# Patient Record
Sex: Female | Born: 1935 | Race: White | Hispanic: No | Marital: Married | Smoking: Never smoker
Health system: Southern US, Community
[De-identification: ages and names within clinical notes are randomized; demographics above are authoritative.]

## PROBLEM LIST (undated history)

## (undated) DIAGNOSIS — M810 Age-related osteoporosis without current pathological fracture: Secondary | ICD-10-CM

## (undated) DIAGNOSIS — J189 Pneumonia, unspecified organism: Secondary | ICD-10-CM

## (undated) DIAGNOSIS — E785 Hyperlipidemia, unspecified: Secondary | ICD-10-CM

## (undated) DIAGNOSIS — K5730 Diverticulosis of large intestine without perforation or abscess without bleeding: Secondary | ICD-10-CM

## (undated) DIAGNOSIS — Z85038 Personal history of other malignant neoplasm of large intestine: Principal | ICD-10-CM

## (undated) DIAGNOSIS — C801 Malignant (primary) neoplasm, unspecified: Secondary | ICD-10-CM

## (undated) DIAGNOSIS — K449 Diaphragmatic hernia without obstruction or gangrene: Secondary | ICD-10-CM

## (undated) DIAGNOSIS — E039 Hypothyroidism, unspecified: Secondary | ICD-10-CM

## (undated) DIAGNOSIS — K219 Gastro-esophageal reflux disease without esophagitis: Secondary | ICD-10-CM

## (undated) DIAGNOSIS — H9190 Unspecified hearing loss, unspecified ear: Secondary | ICD-10-CM

## (undated) DIAGNOSIS — Z8719 Personal history of other diseases of the digestive system: Secondary | ICD-10-CM

## (undated) HISTORY — PX: CHOLECYSTECTOMY: SHX55

## (undated) HISTORY — DX: Malignant (primary) neoplasm, unspecified: C80.1

## (undated) HISTORY — PX: ABDOMINAL HYSTERECTOMY: SHX81

## (undated) HISTORY — PX: ELBOW SURGERY: SHX618

## (undated) HISTORY — DX: Hyperlipidemia, unspecified: E78.5

## (undated) HISTORY — DX: Age-related osteoporosis without current pathological fracture: M81.0

## (undated) HISTORY — PX: APPENDECTOMY: SHX54

## (undated) HISTORY — DX: Hypothyroidism, unspecified: E03.9

## (undated) HISTORY — PX: VAGINAL PROLAPSE REPAIR: SHX830

## (undated) HISTORY — DX: Personal history of other malignant neoplasm of large intestine: Z85.038

---

## 1998-06-15 ENCOUNTER — Ambulatory Visit (HOSPITAL_COMMUNITY): Admission: RE | Admit: 1998-06-15 | Discharge: 1998-06-15 | Payer: Self-pay | Admitting: Gastroenterology

## 2001-06-20 ENCOUNTER — Encounter (INDEPENDENT_AMBULATORY_CARE_PROVIDER_SITE_OTHER): Payer: Self-pay | Admitting: Specialist

## 2001-06-20 ENCOUNTER — Ambulatory Visit (HOSPITAL_COMMUNITY): Admission: RE | Admit: 2001-06-20 | Discharge: 2001-06-20 | Payer: Self-pay | Admitting: Gastroenterology

## 2004-09-06 ENCOUNTER — Ambulatory Visit: Payer: Self-pay | Admitting: Unknown Physician Specialty

## 2007-05-29 HISTORY — PX: OTHER SURGICAL HISTORY: SHX169

## 2007-11-14 ENCOUNTER — Ambulatory Visit: Payer: Self-pay | Admitting: Unknown Physician Specialty

## 2007-11-20 ENCOUNTER — Ambulatory Visit: Payer: Self-pay | Admitting: Unknown Physician Specialty

## 2007-11-26 DIAGNOSIS — C801 Malignant (primary) neoplasm, unspecified: Secondary | ICD-10-CM

## 2007-11-26 HISTORY — DX: Malignant (primary) neoplasm, unspecified: C80.1

## 2007-12-02 ENCOUNTER — Ambulatory Visit: Payer: Self-pay | Admitting: Surgery

## 2007-12-24 ENCOUNTER — Inpatient Hospital Stay: Payer: Self-pay | Admitting: Surgery

## 2008-01-26 ENCOUNTER — Ambulatory Visit: Payer: Self-pay | Admitting: Oncology

## 2008-01-27 LAB — CEA: CEA: 2.5 ng/mL (ref 0.0–5.0)

## 2008-01-27 LAB — COMPREHENSIVE METABOLIC PANEL
ALT: 12 U/L (ref 0–35)
AST: 20 U/L (ref 0–37)
CO2: 25 mEq/L (ref 19–32)
Chloride: 102 mEq/L (ref 96–112)
Sodium: 136 mEq/L (ref 135–145)
Total Bilirubin: 0.2 mg/dL — ABNORMAL LOW (ref 0.3–1.2)
Total Protein: 6.6 g/dL (ref 6.0–8.3)

## 2008-01-27 LAB — CBC WITH DIFFERENTIAL (CANCER CENTER ONLY)
BASO#: 0 10*3/uL (ref 0.0–0.2)
EOS%: 1.7 % (ref 0.0–7.0)
Eosinophils Absolute: 0.1 10*3/uL (ref 0.0–0.5)
HCT: 29.6 % — ABNORMAL LOW (ref 34.8–46.6)
HGB: 9.9 g/dL — ABNORMAL LOW (ref 11.6–15.9)
MCH: 25.2 pg — ABNORMAL LOW (ref 26.0–34.0)
MCHC: 33.5 g/dL (ref 32.0–36.0)
MONO%: 5.9 % (ref 0.0–13.0)
NEUT#: 4.2 10*3/uL (ref 1.5–6.5)
NEUT%: 60.5 % (ref 39.6–80.0)
RBC: 3.94 10*6/uL (ref 3.70–5.32)

## 2008-01-27 LAB — IRON AND TIBC: TIBC: 433 ug/dL (ref 250–470)

## 2008-01-27 LAB — FOLATE: Folate: >20 ng/mL

## 2008-01-27 LAB — VITAMIN B12: Vitamin B-12: 693 pg/mL (ref 211–911)

## 2008-03-26 ENCOUNTER — Ambulatory Visit: Payer: Self-pay | Admitting: Oncology

## 2008-03-29 LAB — CBC WITH DIFFERENTIAL (CANCER CENTER ONLY)
BASO#: 0 10*3/uL (ref 0.0–0.2)
EOS%: 1.9 % (ref 0.0–7.0)
Eosinophils Absolute: 0.1 10*3/uL (ref 0.0–0.5)
HCT: 35.9 % (ref 34.8–46.6)
HGB: 11.9 g/dL (ref 11.6–15.9)
LYMPH#: 2 10*3/uL (ref 0.9–3.3)
MONO#: 0.4 10*3/uL (ref 0.1–0.9)
NEUT#: 3.4 10*3/uL (ref 1.5–6.5)
NEUT%: 57.5 % (ref 39.6–80.0)
RBC: 4.51 10*6/uL (ref 3.70–5.32)
WBC: 6 10*3/uL (ref 3.9–10.0)

## 2008-03-29 LAB — CMP (CANCER CENTER ONLY)
CO2: 29 mEq/L (ref 18–33)
Calcium: 9.2 mg/dL (ref 8.0–10.3)
Creat: 0.8 mg/dl (ref 0.6–1.2)
Glucose, Bld: 106 mg/dL (ref 73–118)
Total Bilirubin: 0.5 mg/dl (ref 0.20–1.60)
Total Protein: 7.1 g/dL (ref 6.4–8.1)

## 2008-03-29 LAB — LACTATE DEHYDROGENASE: LDH: 127 U/L (ref 94–250)

## 2008-09-21 ENCOUNTER — Ambulatory Visit: Payer: Self-pay | Admitting: Oncology

## 2008-09-22 LAB — CBC WITH DIFFERENTIAL (CANCER CENTER ONLY)
BASO%: 0.8 % (ref 0.0–2.0)
EOS%: 2.2 % (ref 0.0–7.0)
HCT: 40.2 % (ref 34.8–46.6)
LYMPH#: 2.3 10*3/uL (ref 0.9–3.3)
MCHC: 34.6 g/dL (ref 32.0–36.0)
MONO#: 0.4 10*3/uL (ref 0.1–0.9)
NEUT#: 3.8 10*3/uL (ref 1.5–6.5)
NEUT%: 56.9 % (ref 39.6–80.0)
RDW: 12 % (ref 10.5–14.6)
WBC: 6.7 10*3/uL (ref 3.9–10.0)

## 2008-09-22 LAB — CMP (CANCER CENTER ONLY)
ALT(SGPT): 19 U/L (ref 10–47)
Alkaline Phosphatase: 64 U/L (ref 26–84)
Sodium: 144 mEq/L (ref 128–145)
Total Bilirubin: 0.6 mg/dl (ref 0.20–1.60)
Total Protein: 7.9 g/dL (ref 6.4–8.1)

## 2009-01-18 ENCOUNTER — Ambulatory Visit: Payer: Self-pay | Admitting: Unknown Physician Specialty

## 2009-03-23 ENCOUNTER — Ambulatory Visit: Payer: Self-pay | Admitting: Oncology

## 2009-03-23 ENCOUNTER — Ambulatory Visit: Payer: Self-pay | Admitting: Unknown Physician Specialty

## 2009-03-25 LAB — CBC WITH DIFFERENTIAL (CANCER CENTER ONLY)
BASO%: 0.9 % (ref 0.0–2.0)
EOS%: 1.6 % (ref 0.0–7.0)
LYMPH#: 2.3 10*3/uL (ref 0.9–3.3)
LYMPH%: 29.4 % (ref 14.0–48.0)
MCHC: 33.8 g/dL (ref 32.0–36.0)
MCV: 88 fL (ref 81–101)
MONO#: 0.4 10*3/uL (ref 0.1–0.9)
Platelets: 256 10*3/uL (ref 145–400)
RDW: 12.1 % (ref 10.5–14.6)
WBC: 7.9 10*3/uL (ref 3.9–10.0)

## 2009-03-25 LAB — CMP (CANCER CENTER ONLY)
Albumin: 3.8 g/dL (ref 3.3–5.5)
CO2: 29 mEq/L (ref 18–33)
Calcium: 9.4 mg/dL (ref 8.0–10.3)
Chloride: 98 mEq/L (ref 98–108)
Glucose, Bld: 96 mg/dL (ref 73–118)
Potassium: 4.3 mEq/L (ref 3.3–4.7)
Sodium: 142 mEq/L (ref 128–145)
Total Bilirubin: 0.5 mg/dl (ref 0.20–1.60)
Total Protein: 7.8 g/dL (ref 6.4–8.1)

## 2009-03-25 LAB — CEA: CEA: 1.2 ng/mL (ref 0.0–5.0)

## 2009-09-15 ENCOUNTER — Ambulatory Visit: Payer: Self-pay | Admitting: Oncology

## 2009-09-23 LAB — CMP (CANCER CENTER ONLY)
ALT(SGPT): 27 U/L (ref 10–47)
AST: 25 U/L (ref 11–38)
BUN, Bld: 11 mg/dL (ref 7–22)
Calcium: 9.2 mg/dL (ref 8.0–10.3)
Creat: 0.7 mg/dl (ref 0.6–1.2)
Total Bilirubin: 0.5 mg/dl (ref 0.20–1.60)

## 2009-09-23 LAB — CBC WITH DIFFERENTIAL (CANCER CENTER ONLY)
BASO#: 0.1 10*3/uL (ref 0.0–0.2)
BASO%: 0.7 % (ref 0.0–2.0)
EOS%: 1.7 % (ref 0.0–7.0)
HCT: 37.8 % (ref 34.8–46.6)
HGB: 13 g/dL (ref 11.6–15.9)
LYMPH#: 2.4 10*3/uL (ref 0.9–3.3)
LYMPH%: 32 % (ref 14.0–48.0)
MCH: 29.5 pg (ref 26.0–34.0)
MCHC: 34.5 g/dL (ref 32.0–36.0)
MCV: 85 fL (ref 81–101)
MONO%: 5.9 % (ref 0.0–13.0)
NEUT%: 59.7 % (ref 39.6–80.0)
RDW: 12.6 % (ref 10.5–14.6)

## 2009-11-10 ENCOUNTER — Ambulatory Visit: Payer: Self-pay | Admitting: Family Medicine

## 2010-03-21 ENCOUNTER — Ambulatory Visit: Payer: Self-pay | Admitting: Oncology

## 2010-03-24 LAB — CBC WITH DIFFERENTIAL (CANCER CENTER ONLY)
BASO%: 0.8 % (ref 0.0–2.0)
EOS%: 2.3 % (ref 0.0–7.0)
LYMPH#: 2.3 10*3/uL (ref 0.9–3.3)
LYMPH%: 31.1 % (ref 14.0–48.0)
MCHC: 34.5 g/dL (ref 32.0–36.0)
MCV: 87 fL (ref 81–101)
MONO#: 0.4 10*3/uL (ref 0.1–0.9)
Platelets: 230 10*3/uL (ref 145–400)
RDW: 12.4 % (ref 10.5–14.6)
WBC: 7.4 10*3/uL (ref 3.9–10.0)

## 2010-03-24 LAB — CMP (CANCER CENTER ONLY)
ALT(SGPT): 24 U/L (ref 10–47)
AST: 26 U/L (ref 11–38)
CO2: 30 mEq/L (ref 18–33)
Sodium: 145 mEq/L (ref 128–145)
Total Bilirubin: 0.6 mg/dl (ref 0.20–1.60)
Total Protein: 7.9 g/dL (ref 6.4–8.1)

## 2010-03-24 LAB — CEA: CEA: 1.7 ng/mL (ref 0.0–5.0)

## 2010-05-15 ENCOUNTER — Ambulatory Visit: Payer: Self-pay | Admitting: Family Medicine

## 2010-09-26 ENCOUNTER — Other Ambulatory Visit: Payer: Self-pay | Admitting: Oncology

## 2010-09-26 ENCOUNTER — Encounter (HOSPITAL_BASED_OUTPATIENT_CLINIC_OR_DEPARTMENT_OTHER): Payer: Medicare Other | Admitting: Oncology

## 2010-09-26 DIAGNOSIS — D649 Anemia, unspecified: Secondary | ICD-10-CM

## 2010-09-26 DIAGNOSIS — C182 Malignant neoplasm of ascending colon: Secondary | ICD-10-CM

## 2010-09-26 LAB — CBC WITH DIFFERENTIAL/PLATELET
Basophils Absolute: 0 10*3/uL (ref 0.0–0.1)
EOS%: 1.4 % (ref 0.0–7.0)
Eosinophils Absolute: 0.1 10*3/uL (ref 0.0–0.5)
HCT: 41.4 % (ref 34.8–46.6)
HGB: 13.9 g/dL (ref 11.6–15.9)
MCH: 29.8 pg (ref 25.1–34.0)
MCV: 88.9 fL (ref 79.5–101.0)
MONO%: 6 % (ref 0.0–14.0)
NEUT#: 5.5 10*3/uL (ref 1.5–6.5)
NEUT%: 64 % (ref 38.4–76.8)
RDW: 13.7 % (ref 11.2–14.5)
lymph#: 2.4 10*3/uL (ref 0.9–3.3)

## 2010-09-26 LAB — COMPREHENSIVE METABOLIC PANEL
AST: 24 U/L (ref 0–37)
Albumin: 4.5 g/dL (ref 3.5–5.2)
BUN: 12 mg/dL (ref 6–23)
Calcium: 9.5 mg/dL (ref 8.4–10.5)
Chloride: 102 mEq/L (ref 96–112)
Creatinine, Ser: 0.75 mg/dL (ref 0.40–1.20)
Glucose, Bld: 107 mg/dL — ABNORMAL HIGH (ref 70–99)
Potassium: 4 mEq/L (ref 3.5–5.3)

## 2010-10-13 NOTE — Procedures (Signed)
[facility]  Patient:    Joanne Gomez, Joanne Gomez Visit Number: [id number] MRN: [id number]          Service Type: END Location: ENDO Attending Physician:  Rich Brave Dictated by:   Florencia Reasons, M.D. Proc. Date: 06/20/01 Admit Date:  06/20/2001   CC:         Sharin Grave, M.D. [address], [place], [place] [address]   Procedure Report  PROCEDURE PERFORMED:  Colonoscopy with biopsies.  INDICATION:  This is a 77 year old female with a family history of colon cancer and a history of several polyps having been removed three years ago.  FINDINGS:  Several small polyps removed.  DESCRIPTION OF PROCEDURE:  The nature, purpose and risk of the procedure were familiar to the patient from prior examination.  She provided written consent.  Sedation was Fentanyl 80 mcg and 8 mg IV without arrhythmias or desaturation.  The Olympus adjustable tension pediatric video colonoscope was advanced with some looping around the colon to the cecum as identified by clear visualization of the appendiceal orifice, after which pull back was performed. The quality of the prep was excellent so it was felt that all areas were well seen.  In the cecum and also in the ascending colon and the left colon I encountered a total of about 3 or 4 small sessile polyps typically 2 x 3 mm in size, each removed by one or several cold biopsies.  No large polyps, cancer, colitis, vascular malformations or diverticular disease were observed.  Retroflexion of the rectum was normal.  Reinspection of the rectosigmoid was normal.  The patient did appear to have hypertrophied anal papilla during pull out through the anal canal.  The patient tolerated the procedure well and there were no apparent complications.  IMPRESSION:  Several small colon polyps removed, otherwise normal exam.  PLAN:  Await pathology.  Anticipate colonoscopic follow up in three to five years, considering the  family history of colon cancer. Dictated by:   Florencia Reasons, M.D. Attending Physician:  Rich Brave DD:  06/20/01 TD:  06/21/01 Job: [id number]

## 2011-01-25 ENCOUNTER — Ambulatory Visit: Payer: Self-pay | Admitting: Unknown Physician Specialty

## 2011-01-31 ENCOUNTER — Ambulatory Visit: Payer: Self-pay | Admitting: Urology

## 2011-02-05 ENCOUNTER — Ambulatory Visit: Payer: Self-pay | Admitting: Urology

## 2011-02-20 ENCOUNTER — Ambulatory Visit: Payer: Self-pay | Admitting: Unknown Physician Specialty

## 2011-02-22 LAB — PATHOLOGY REPORT

## 2011-03-06 ENCOUNTER — Ambulatory Visit: Payer: Self-pay | Admitting: Surgery

## 2011-03-13 ENCOUNTER — Ambulatory Visit: Payer: Self-pay | Admitting: Surgery

## 2011-03-14 LAB — PATHOLOGY REPORT

## 2011-05-16 ENCOUNTER — Other Ambulatory Visit (HOSPITAL_BASED_OUTPATIENT_CLINIC_OR_DEPARTMENT_OTHER): Payer: Medicare Other | Admitting: Lab

## 2011-05-16 ENCOUNTER — Telehealth: Payer: Self-pay | Admitting: *Deleted

## 2011-05-16 ENCOUNTER — Other Ambulatory Visit: Payer: Self-pay | Admitting: Oncology

## 2011-05-16 ENCOUNTER — Ambulatory Visit (HOSPITAL_BASED_OUTPATIENT_CLINIC_OR_DEPARTMENT_OTHER): Payer: Medicare Other | Admitting: Oncology

## 2011-05-16 VITALS — BP 149/78 | HR 77 | Temp 98.9°F | Wt 141.6 lb

## 2011-05-16 DIAGNOSIS — Z85038 Personal history of other malignant neoplasm of large intestine: Secondary | ICD-10-CM

## 2011-05-16 DIAGNOSIS — C182 Malignant neoplasm of ascending colon: Secondary | ICD-10-CM

## 2011-05-16 DIAGNOSIS — C189 Malignant neoplasm of colon, unspecified: Secondary | ICD-10-CM

## 2011-05-16 DIAGNOSIS — Z9889 Other specified postprocedural states: Secondary | ICD-10-CM

## 2011-05-16 LAB — CBC WITH DIFFERENTIAL/PLATELET
Basophils Absolute: 0 10*3/uL (ref 0.0–0.1)
EOS%: 1.3 % (ref 0.0–7.0)
Eosinophils Absolute: 0.1 10*3/uL (ref 0.0–0.5)
HGB: 13.6 g/dL (ref 11.6–15.9)
LYMPH%: 31.3 % (ref 14.0–49.7)
MCH: 29.8 pg (ref 25.1–34.0)
MCV: 88.8 fL (ref 79.5–101.0)
MONO%: 7.1 % (ref 0.0–14.0)
NEUT#: 4 10*3/uL (ref 1.5–6.5)
Platelets: 213 10*3/uL (ref 145–400)
RBC: 4.56 10*6/uL (ref 3.70–5.45)
RDW: 13.9 % (ref 11.2–14.5)

## 2011-05-16 LAB — COMPREHENSIVE METABOLIC PANEL
ALT: 20 U/L (ref 0–35)
AST: 22 U/L (ref 0–37)
Albumin: 4.3 g/dL (ref 3.5–5.2)
CO2: 28 mEq/L (ref 19–32)
Calcium: 10 mg/dL (ref 8.4–10.5)
Chloride: 104 mEq/L (ref 96–112)
Potassium: 4.5 mEq/L (ref 3.5–5.3)
Sodium: 142 mEq/L (ref 135–145)
Total Protein: 7.4 g/dL (ref 6.0–8.3)

## 2011-05-16 LAB — CEA
CEA: 1 ng/mL (ref 0.0–5.0)
CEA: 1 ng/mL (ref 0.0–5.0)

## 2011-05-16 NOTE — Progress Notes (Signed)
  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  CURRENT THERAPY:  Observation.  INTERVAL HISTORY:  Overall patient is doing well she is without any significant complaints. She denies any fevers chills night sweats headaches shortness of breath chest pains palpitations no nausea or vomiting. She has no hematuria hematochezia melena hemoptysis or hematemesis no abdominal pain no changes in her bowels. Remainder of the 10 point review of systems is negative. CURRENT MEDICATIONS:  Medications are reviewed and they are file separately in the electronic medical record  PHYSICAL EXAM:   Filed Vitals:   05/16/11 1000  BP: 149/78  Pulse: 77  Temp: 98.9 F (37.2 C)    HEENT Exam:  EOMI.  PERLA.  Sclerae anicteric.  No conjunctival pallor.  Oral mucosa is moist.  Neck:  Supple.  Lungs:  Clear to auscultation and percussion.  Cardiovascular:  Regular rate and rhythm.  No murmurs.  Abdomen:  Soft, nontender, nondistended.  Bowel sounds are present.  No HSM.  Extremities:  No edema.  Neurological:  Patient is alert, oriented, otherwise nonfocal.  LABORATORY DATA:  . Lab Results  Component Value Date   WBC 6.7 05/16/2011   HGB 13.6 05/16/2011   HCT 40.5 05/16/2011   MCV 88.8 05/16/2011   PLT 213 05/16/2011   IMPRESSION AND PLAN:  77 year old female with stage II a colon carcinoma patient underwent a hemicolectomy in July 2009. She is currently without evidence of recurrent disease. Her blood counts remain normal. She her last colonoscopy was in September 2012. She overall feels well and is doing well. I will plan on seeing her back in 6 months time.  I certainly can see the patient sooner if need arises. I spent 30 minutes with the patient greater than 50% of the time was spent in counseling and coordination of care.  Drue Second,  MD Medical/Oncology [facility] [phone] (beeper) [phone] (Office)  05/16/2011, 10:43 AM

## 2011-05-16 NOTE — Telephone Encounter (Signed)
gave patient appointment for 11-15-2011 printed out calendar and gave to the patient

## 2011-05-28 ENCOUNTER — Telehealth: Payer: Self-pay | Admitting: *Deleted

## 2011-05-28 NOTE — Telephone Encounter (Signed)
Message copied by Cooper Render on Mon May 28, 2011  2:16 PM ------      Message from: Victorino December      Created: Wed May 23, 2011  1:21 PM       Call patient: labs look good

## 2011-05-28 NOTE — Telephone Encounter (Signed)
Pt notified. Labs look good.

## 2011-07-24 ENCOUNTER — Ambulatory Visit: Payer: Self-pay | Admitting: Family Medicine

## 2011-11-12 ENCOUNTER — Ambulatory Visit: Payer: Self-pay | Admitting: Urology

## 2011-11-15 ENCOUNTER — Ambulatory Visit (HOSPITAL_BASED_OUTPATIENT_CLINIC_OR_DEPARTMENT_OTHER): Payer: Medicare Other | Admitting: Oncology

## 2011-11-15 ENCOUNTER — Other Ambulatory Visit (HOSPITAL_BASED_OUTPATIENT_CLINIC_OR_DEPARTMENT_OTHER): Payer: Medicare Other | Admitting: Lab

## 2011-11-15 ENCOUNTER — Telehealth: Payer: Self-pay | Admitting: Oncology

## 2011-11-15 ENCOUNTER — Encounter: Payer: Self-pay | Admitting: Oncology

## 2011-11-15 VITALS — BP 155/86 | HR 70 | Temp 97.9°F | Ht 63.5 in | Wt 140.2 lb

## 2011-11-15 DIAGNOSIS — Z85038 Personal history of other malignant neoplasm of large intestine: Secondary | ICD-10-CM | POA: Insufficient documentation

## 2011-11-15 DIAGNOSIS — K5900 Constipation, unspecified: Secondary | ICD-10-CM

## 2011-11-15 DIAGNOSIS — C182 Malignant neoplasm of ascending colon: Secondary | ICD-10-CM

## 2011-11-15 DIAGNOSIS — R141 Gas pain: Secondary | ICD-10-CM

## 2011-11-15 DIAGNOSIS — C189 Malignant neoplasm of colon, unspecified: Secondary | ICD-10-CM

## 2011-11-15 DIAGNOSIS — K219 Gastro-esophageal reflux disease without esophagitis: Secondary | ICD-10-CM

## 2011-11-15 HISTORY — DX: Personal history of other malignant neoplasm of large intestine: Z85.038

## 2011-11-15 LAB — COMPREHENSIVE METABOLIC PANEL
ALT: 19 U/L (ref 0–35)
AST: 21 U/L (ref 0–37)
Albumin: 4.2 g/dL (ref 3.5–5.2)
Alkaline Phosphatase: 51 U/L (ref 39–117)
BUN: 10 mg/dL (ref 6–23)
CO2: 28 mEq/L (ref 19–32)
Calcium: 9.2 mg/dL (ref 8.4–10.5)
Chloride: 102 mEq/L (ref 96–112)
Creatinine, Ser: 0.67 mg/dL (ref 0.50–1.10)
Glucose, Bld: 79 mg/dL (ref 70–99)
Potassium: 3.9 mEq/L (ref 3.5–5.3)
Sodium: 138 mEq/L (ref 135–145)
Total Bilirubin: 0.2 mg/dL — ABNORMAL LOW (ref 0.3–1.2)
Total Protein: 6.9 g/dL (ref 6.0–8.3)

## 2011-11-15 LAB — CEA: CEA: 1.6 ng/mL (ref 0.0–5.0)

## 2011-11-15 NOTE — Telephone Encounter (Signed)
gve the pt her jan 2014 appt calendar °

## 2011-11-15 NOTE — Patient Instructions (Addendum)
1. Keep your appointment with Dr. Tanya Nones  2. Follow up with Dr. Markham Jordan (GI) about the constipation and gas  3. I will see you back in 6 months (Jan 2014)

## 2011-11-15 NOTE — Progress Notes (Signed)
OFFICE PROGRESS NOTE  CC [name] Dr. 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  PRIOR THERAPY:  #1 patient underwent a hemicolectomy for a stage II A. Adenocarcinoma. This was performed in July 2009.  #2 patient has been on observation only.  CURRENT THERAPY:Observation  INTERVAL HISTORY: Joanne Gomez 78 y.o. female returns for Followup visit today her last visit with me was back in May 2012. Clinically she seems to be doing well and is really without any significant problems she denies any fevers chills night sweats headaches shortness of breath chest pains palpitations. She does have some abdominal pain off-and-on she states that she feels like she gets gas pressure. She had a CT of the abdomen performed which was negative. She has some nausea off-and-on. She does pass a lot of gas she does have some constipation. She did has developed some hemorrhoidal bleeding due to the constipation. She has been taking stool softeners and that does help. She also has been seen by a urologist for possibly urinary tract infections and had kidney function performed which was normal. She denies any back pain. She has no headaches no difficulty in swallowing. Remainder of the 10 point review of systems is negative.  MEDICAL HISTORY: Past Medical History  Diagnosis Date  . Cancer 11/2007    colon stage II  . H/O colon cancer, stage II 11/15/2011    ALLERGIES:   has no known allergies.  MEDICATIONS:  Current Outpatient Prescriptions  Medication Sig Dispense Refill  . Biotin 2500 MCG CAPS Take 1 each by mouth.      . calcium carbonate 1250 MG capsule Take 1,250 mg by mouth 2 (two) times daily with a meal.        . esomeprazole (NEXIUM) 40 MG capsule Take 40 mg by mouth daily before breakfast.        . Multiple Vitamin (MULTIVITAMIN) tablet Take 1 tablet by mouth daily.        . pravastatin  (PRAVACHOL) 20 MG tablet Take 20 mg by mouth daily.      . Probiotic Product (PROBIOTIC FORMULA PO) Take 1 each by mouth.        . thyroid (ARMOUR) 15 MG tablet Take 60 mg by mouth daily.         SURGICAL HISTORY: No past surgical history on file.  REVIEW OF SYSTEMS:  Pertinent items are noted in HPI.   PHYSICAL EXAMINATION: General appearance: alert, cooperative and appears stated age Neck: no adenopathy, no carotid bruit, no JVD, supple, symmetrical, trachea midline and thyroid not enlarged, symmetric, no tenderness/mass/nodules Lymph nodes: Cervical, supraclavicular, and axillary nodes normal. Resp: clear to auscultation bilaterally and normal percussion bilaterally Back: symmetric, no curvature. ROM normal. No CVA tenderness. Cardio: regular rate and rhythm, S1, S2 normal, no murmur, click, rub or gallop GI: soft, non-tender; bowel sounds normal; no masses,  no organomegaly Extremities: extremities normal, atraumatic, no cyanosis or edema Neurologic: Grossly normal  ECOG PERFORMANCE STATUS: 0 - Asymptomatic  Blood pressure 155/86, pulse 70, temperature 97.9 F (36.6 C), temperature source Oral, height 5' 3.5" (1.613 m), weight 140 lb 3.2 oz (63.594 kg).  LABORATORY DATA: Lab Results  Component Value Date   WBC 6.7 05/16/2011   HGB 13.6 05/16/2011   HCT 40.5 05/16/2011   MCV 88.8 05/16/2011   PLT 213 05/16/2011      Chemistry      Component Value Date/Time  NA 142 05/16/2011 0946   NA 145 03/24/2010 0830   K 4.5 05/16/2011 0946   K 4.5 03/24/2010 0830   CL 104 05/16/2011 0946   CL 104 03/24/2010 0830   CO2 28 05/16/2011 0946   CO2 30 03/24/2010 0830   BUN 11 05/16/2011 0946   BUN 11 03/24/2010 0830   CREATININE 0.62 05/16/2011 0946   CREATININE 0.7 03/24/2010 0830      Component Value Date/Time   CALCIUM 10.0 05/16/2011 0946   CALCIUM 9.8 03/24/2010 0830   ALKPHOS 65 05/16/2011 0946   ALKPHOS 59 03/24/2010 0830   AST 22 05/16/2011 0946   AST 26 03/24/2010  0830   ALT 20 05/16/2011 0946   BILITOT 0.3 05/16/2011 0946   BILITOT 0.60 03/24/2010 0830       RADIOGRAPHIC STUDIES:  No results found.  ASSESSMENT: 78 year old female with  #1 stage IIa adenocarcinoma of colon diagnosed July 2009 status post resection and then observation only.  #2 constipation and flatulence unclear etiology. Patient has had a workup by urology.  #3 reflux disease patient is on Nexium currently.   PLAN:   #1 patient is without any evidence of recurrent disease and she will be continue to follow every 6 months for the first 5 years.  #2 constipation I did offer to refer the patient back to Dr. Woody Seller but patient wants to wait until she sees Dr. Gailen Shelter next week and see what his recommendations are.  #3 patient knows to call me with any problems questions or concerns.   All questions were answered. The patient knows to call the clinic with any problems, questions or concerns. We can certainly see the patient much sooner if necessary.  I spent 20 minutes counseling the patient face to face. The total time spent in the appointment was 30 minutes.    Drue Second, MD Medical/Oncology [facility] [phone] (beeper) [phone] (Office)  11/15/2011, 10:15 AM

## 2011-11-19 ENCOUNTER — Telehealth: Payer: Self-pay | Admitting: *Deleted

## 2011-11-19 NOTE — Telephone Encounter (Signed)
Called notified pt labs normal

## 2011-11-19 NOTE — Telephone Encounter (Signed)
Message copied by Cooper Render on Mon Nov 19, 2011 11:12 AM ------      Message from: Joanne Gomez      Created: Fri Nov 16, 2011 12:53 PM       Call patient: labs normal

## 2012-06-16 ENCOUNTER — Other Ambulatory Visit (HOSPITAL_BASED_OUTPATIENT_CLINIC_OR_DEPARTMENT_OTHER): Payer: Medicare Other | Admitting: Lab

## 2012-06-16 ENCOUNTER — Telehealth: Payer: Self-pay | Admitting: Oncology

## 2012-06-16 ENCOUNTER — Encounter: Payer: Self-pay | Admitting: Physician Assistant

## 2012-06-16 ENCOUNTER — Ambulatory Visit (HOSPITAL_BASED_OUTPATIENT_CLINIC_OR_DEPARTMENT_OTHER): Payer: Medicare Other | Admitting: Physician Assistant

## 2012-06-16 VITALS — BP 156/78 | HR 80 | Temp 97.6°F | Resp 20 | Ht 63.5 in | Wt 141.0 lb

## 2012-06-16 DIAGNOSIS — Z85038 Personal history of other malignant neoplasm of large intestine: Secondary | ICD-10-CM

## 2012-06-16 LAB — CBC WITH DIFFERENTIAL/PLATELET
BASO%: 0.7 % (ref 0.0–2.0)
EOS%: 1.7 % (ref 0.0–7.0)
HCT: 40.7 % (ref 34.8–46.6)
LYMPH%: 27.8 % (ref 14.0–49.7)
MCH: 29.3 pg (ref 25.1–34.0)
MCHC: 33.5 g/dL (ref 31.5–36.0)
NEUT%: 62.8 % (ref 38.4–76.8)
lymph#: 2.1 10*3/uL (ref 0.9–3.3)

## 2012-06-16 LAB — COMPREHENSIVE METABOLIC PANEL (CC13)
AST: 17 U/L (ref 5–34)
Alkaline Phosphatase: 53 U/L (ref 40–150)
BUN: 11 mg/dL (ref 7.0–26.0)
Creatinine: 0.7 mg/dL (ref 0.6–1.1)
Potassium: 3.9 mEq/L (ref 3.5–5.1)

## 2012-06-16 NOTE — Telephone Encounter (Signed)
appts made and printed for pt aom °

## 2012-06-16 NOTE — Progress Notes (Signed)
[facility]  Telephone:(336) [phone] Fax:([phone]) [phone]   OFFICE PROGRESS NOTE  CC  [name]  Dr. 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   PRIOR THERAPY:   #1 patient underwent a hemicolectomy for a stage II A. Adenocarcinoma. This was performed in July 2009.   #2 patient has been on observation only.   CURRENT THERAPY:Observation   INTERVAL HISTORY:  Joanne Gomez 79 y.o. female returns for Followup visit today. She was last seen by Dr. Welton Flakes in 11/15/2911, at which time, she was clinically stable,without any significant problems, such as  Fevers, chills, night sweats, headaches, shortness of breath, chest pains or  palpitations.Denies abdominal pain.She has intermittent hemorrhoidal discomfort relieved with stool softeners. She denies any back pain. She has noticed increased vaginal dryness, for which she has recently been started on Estrace vaginal cream with some relief of her symptoms. No other new medical issues. Her last CEA on 11/15/2011 was 1.6.  Remainder of the 10 point review of systems is negative.    Past Medical History  Diagnosis Date  . Cancer 11/2007    colon stage II  . H/O colon cancer, stage II 11/15/2011    Past Surgical History  Procedure Date  . Cholecystectomy   . Hemocolectomy 2009    Current Outpatient Prescriptions  Medication Sig Dispense Refill  . Biotin 2500 MCG CAPS Take 1 each by mouth.      . calcium carbonate 1250 MG capsule Take 1,250 mg by mouth 2 (two) times daily with a meal.        . esomeprazole (NEXIUM) 40 MG capsule Take 40 mg by mouth daily before breakfast.        . Multiple Vitamin (MULTIVITAMIN) tablet Take 1 tablet by mouth daily.        . pravastatin (PRAVACHOL) 20 MG tablet Take 20 mg by mouth daily.      . Probiotic Product (PROBIOTIC FORMULA PO) Take 1 each by mouth.        . thyroid (ARMOUR) 15 MG  tablet Take 60 mg by mouth daily.         ALLERGIES:   has no known allergies.  REVIEW OF SYSTEMS:  No respiratory or cardiac complaints.  The rest of the 14-point review of system was negative.   Filed Vitals:   06/16/12 0926  BP: 156/78  Pulse: 80  Temp: 97.6 F (36.4 C)  Resp: 20   Wt Readings from Last 3 Encounters:  06/16/12 141 lb (63.957 kg)  11/15/11 140 lb 3.2 oz (63.594 kg)  05/16/11 141 lb 9.6 oz (64.229 kg)     PHYSICAL EXAMINATION:  BP 156/78  Pulse 80  Temp 97.6 F (36.4 C)  Resp 20  Ht 5' 3.5" (1.613 m)  Wt 141 lb (63.957 kg)  BMI 24.59 kg/m2 GENERAL: Well developed, well nourished, in no acute distress.  EENT: No ocular or oral lesions. No stomatitis.  RESPIRATORY: Lungs are clear to auscultation bilaterally with normal respiratory movement and no accessory muscle use. CARDIAC: No murmur, rub or tachycardia. No upper or lower extremity edema.  GI: Abdomen is soft, no palpable hepatosplenomegaly. No fluid wave. No tenderness. Musculoskeletal: No kyphosis, no tenderness over the spine, ribs or hips. Lymph: No cervical, infraclavicular, axillary or inguinal adenopathy. Neuro: No focal neurological deficits. Psych: Alert and oriented X 3, appropriate mood and affect.   ECOG PERFORMANCE STATUS:  0 - Asymptomatic       LABORATORY/RADIOLOGY DATA:  Lab Results  Component Value Date   WBC 7.6 06/16/2012   HGB 13.6 06/16/2012   HCT 40.7 06/16/2012   PLT 208 06/16/2012   GLUCOSE 89 06/16/2012   ALKPHOS 53 06/16/2012   ALT 19 06/16/2012   AST 17 06/16/2012   NA 141 06/16/2012   K 3.9 06/16/2012   CL 105 06/16/2012   CREATININE 0.7 06/16/2012   BUN 11.0 06/16/2012   CO2 28 06/16/2012    No results found.     ASSESSMENT   Joanne Gomez 79 y.o. female with   #1 stage IIa adenocarcinoma of colon diagnosed July 2009 status post resection and then observation only.    PLAN:   #1 patient is without any evidence of recurrent disease and she will be  continue to follow every 6 months. She is to approach the 5 year mark from the initial diagnosis and treatment. She will return in August of 2014 with labs, at which time, if medically stable, a 1 year follow up will be initiated.   All questions were answered. The patient knows to call the clinic with any problems, questions or concerns. We can certainly see the patient much sooner if necessary. Case discussed with Dr. Orvan Seen.

## 2012-07-24 ENCOUNTER — Ambulatory Visit: Payer: Self-pay | Admitting: Family Medicine

## 2012-07-29 ENCOUNTER — Ambulatory Visit: Payer: Self-pay | Admitting: Family Medicine

## 2012-08-15 ENCOUNTER — Telehealth: Payer: Self-pay | Admitting: Family Medicine

## 2012-08-15 NOTE — Telephone Encounter (Signed)
Ok done

## 2012-08-15 NOTE — Telephone Encounter (Signed)
i'd like to see report.

## 2012-08-15 NOTE — Telephone Encounter (Signed)
Are they going to biopsy the tissue or get specialized imaging?

## 2012-08-15 NOTE — Telephone Encounter (Signed)
Pt is not sure what the test showed but she is to return in six months for another Mammo. They said nothing about a BX. Patient is to come by here to sign a release so that we can get a copy of report. She had it done in [place].

## 2012-08-22 ENCOUNTER — Telehealth: Payer: Self-pay | Admitting: Medical Oncology

## 2012-08-22 NOTE — Telephone Encounter (Signed)
Pt called to inform office that she had a mammogram (2nd one) at [facility] and received letter stating "questionable area seen, probable benign, we suggest a follow up in 6 months." Informed MD, per MD patient to follow-up with another mammogram in 6 months time as suggested by the letter. Patient voiced verbal understanding. Knows to call office with any questions or concerns.

## 2012-10-01 ENCOUNTER — Encounter: Payer: Self-pay | Admitting: Family Medicine

## 2012-10-01 ENCOUNTER — Ambulatory Visit (INDEPENDENT_AMBULATORY_CARE_PROVIDER_SITE_OTHER): Payer: Medicare Other | Admitting: Family Medicine

## 2012-10-01 VITALS — BP 160/80 | HR 72 | Temp 98.1°F | Resp 16 | Wt 137.0 lb

## 2012-10-01 DIAGNOSIS — E039 Hypothyroidism, unspecified: Secondary | ICD-10-CM

## 2012-10-01 DIAGNOSIS — R002 Palpitations: Secondary | ICD-10-CM

## 2012-10-01 LAB — TSH: TSH: 0.274 u[IU]/mL — ABNORMAL LOW (ref 0.350–4.500)

## 2012-10-01 LAB — HEPATIC FUNCTION PANEL
ALT: 13 U/L (ref 0–35)
AST: 15 U/L (ref 0–37)
Alkaline Phosphatase: 57 U/L (ref 39–117)
Bilirubin, Direct: 0.1 mg/dL (ref 0.0–0.3)
Indirect Bilirubin: 0.3 mg/dL (ref 0.0–0.9)

## 2012-10-01 LAB — CBC WITH DIFFERENTIAL/PLATELET
Basophils Absolute: 0 10*3/uL (ref 0.0–0.1)
HCT: 41.9 % (ref 36.0–46.0)
Lymphocytes Relative: 32 % (ref 12–46)
Monocytes Absolute: 0.5 10*3/uL (ref 0.1–1.0)
Neutro Abs: 4.2 10*3/uL (ref 1.7–7.7)
Neutrophils Relative %: 59 % (ref 43–77)
RDW: 13.7 % (ref 11.5–15.5)
WBC: 7.1 10*3/uL (ref 4.0–10.5)

## 2012-10-01 LAB — BASIC METABOLIC PANEL
BUN: 9 mg/dL (ref 6–23)
Chloride: 105 mEq/L (ref 96–112)
Potassium: 5.1 mEq/L (ref 3.5–5.3)
Sodium: 143 mEq/L (ref 135–145)

## 2012-10-01 NOTE — Progress Notes (Signed)
Subjective:    Patient ID: Joanne Gomez, female    DOB: December 25, 1930, 79 y.o.   MRN: [id number]  HPI  Patient is a very pleasant 79 year old female with a history of colon cancer stage II. She has a history of hypothyroidism. We last checked her thyroid in December. At that time her TSH was borderline low at 0.35. She's currently taking Armour Thyroid 60 mg per day alternating with 90 mg per day.  However over the last 2 months she's been noticing some increased generalized anxiety. She is feels uneasy or unsteady. Chest describes palpitations in her heart. I contacted auscultate a PVC today during her encounter. She denies any change in her weight. She denies any fevers. She denies any other signs of sickness. Otherwise she's doing well. She denies any stress or depression. She denies any panic attacks. Past Medical History  Diagnosis Date  . Cancer 11/2007    colon stage II  . H/O colon cancer, stage II 11/15/2011  . Hypothyroidism    Current Outpatient Prescriptions on File Prior to Visit  Medication Sig Dispense Refill  . aspirin 81 MG tablet Take 81 mg by mouth daily.      . Biotin 2500 MCG CAPS Take 1 each by mouth.      . calcium carbonate 1250 MG capsule Take 1,250 mg by mouth 2 (two) times daily with a meal.        . esomeprazole (NEXIUM) 40 MG capsule Take 40 mg by mouth daily before breakfast.        . estradiol (ESTRACE) 0.1 MG/GM vaginal cream Place 2 g vaginally daily.      . Multiple Vitamin (MULTIVITAMIN) tablet Take 1 tablet by mouth daily.        . pravastatin (PRAVACHOL) 20 MG tablet Take 20 mg by mouth daily.      . Probiotic Product (PROBIOTIC FORMULA PO) Take 1 each by mouth.        . thyroid (ARMOUR) 15 MG tablet Take 60 mg by mouth daily.        No current facility-administered medications on file prior to visit.   No Known Allergies History   Social History  . Marital Status: Married    Spouse Name: N/A    Number of Children: N/A  . Years of Education: N/A    Occupational History  . Not on file.   Social History Main Topics  . Smoking status: Never Smoker   . Smokeless tobacco: Never Used  . Alcohol Use: No  . Drug Use: No  . Sexually Active: Yes   Other Topics Concern  . Not on file   Social History Narrative  . No narrative on file     Review of Systems    review of systems is otherwise negative Objective:   Physical Exam  Constitutional: She is oriented to person, place, and time. She appears well-developed and well-nourished.  HENT:  Head: Normocephalic.  Mouth/Throat: Oropharynx is clear and moist.  Eyes: Conjunctivae are normal. Pupils are equal, round, and reactive to light.  Neck: Neck supple. No thyromegaly present.  Cardiovascular: Normal rate.  Exam reveals gallop (PVC).   No murmur heard. Pulmonary/Chest: Effort normal and breath sounds normal. No respiratory distress. She has no wheezes. She has no rales.  Abdominal: Soft. Bowel sounds are normal.  Lymphadenopathy:    She has no cervical adenopathy.  Neurological: She is alert and oriented to person, place, and time. No cranial nerve deficit. She exhibits normal muscle  tone. Coordination normal.  Skin: Skin is warm. No rash noted. No erythema. No pallor.          Assessment & Plan:  1. Palpitations  Suspect a supratherapeutic dose of Armour Thyroid. Check a TSH. If the TSH is suppressed, we'll decrease Armour Thyroid to 60 mg by mouth daily and then recheck a TSH in 8 weeks.  If TSH is normal I would like to schedule a Holter monitor. - T3, Free - T4, Free - TSH - CBC with Differential - Basic Metabolic Panel - Hepatic Function Panel  2. Unspecified hypothyroidism - T3, Free - T4, Free - TSH - CBC with Differential - Basic Metabolic Panel - Hepatic Function Panel

## 2012-10-02 ENCOUNTER — Other Ambulatory Visit: Payer: Self-pay | Admitting: Family Medicine

## 2012-10-02 MED ORDER — THYROID 60 MG PO TABS: 60.0000 mg | ORAL_TABLET | Freq: Every day | ORAL | Status: DC

## 2012-10-02 NOTE — Telephone Encounter (Signed)
Rx sent to pharmacy   

## 2012-11-03 ENCOUNTER — Telehealth: Payer: Self-pay | Admitting: Oncology

## 2012-12-10 ENCOUNTER — Other Ambulatory Visit: Payer: Self-pay | Admitting: Family Medicine

## 2012-12-10 ENCOUNTER — Telehealth: Payer: Self-pay | Admitting: Family Medicine

## 2012-12-10 DIAGNOSIS — E039 Hypothyroidism, unspecified: Secondary | ICD-10-CM

## 2012-12-10 DIAGNOSIS — E782 Mixed hyperlipidemia: Secondary | ICD-10-CM

## 2012-12-10 DIAGNOSIS — Z79899 Other long term (current) drug therapy: Secondary | ICD-10-CM

## 2012-12-11 NOTE — Telephone Encounter (Signed)
Orders placed.

## 2012-12-18 ENCOUNTER — Telehealth: Payer: Self-pay | Admitting: Family Medicine

## 2012-12-19 NOTE — Telephone Encounter (Signed)
Pt was informed of lab results while husband was in ov and given copy of labs

## 2012-12-31 ENCOUNTER — Telehealth: Payer: Self-pay | Admitting: Family Medicine

## 2012-12-31 DIAGNOSIS — Z1231 Encounter for screening mammogram for malignant neoplasm of breast: Secondary | ICD-10-CM

## 2012-12-31 DIAGNOSIS — M810 Age-related osteoporosis without current pathological fracture: Secondary | ICD-10-CM

## 2013-01-01 NOTE — Telephone Encounter (Signed)
ordered

## 2013-01-06 ENCOUNTER — Other Ambulatory Visit: Payer: Self-pay | Admitting: Family Medicine

## 2013-01-06 DIAGNOSIS — Z87898 Personal history of other specified conditions: Secondary | ICD-10-CM

## 2013-01-07 ENCOUNTER — Other Ambulatory Visit: Payer: Medicare Other | Admitting: Lab

## 2013-01-07 ENCOUNTER — Ambulatory Visit: Payer: Medicare Other | Admitting: Oncology

## 2013-01-09 ENCOUNTER — Telehealth: Payer: Self-pay | Admitting: *Deleted

## 2013-01-09 ENCOUNTER — Other Ambulatory Visit (HOSPITAL_BASED_OUTPATIENT_CLINIC_OR_DEPARTMENT_OTHER): Payer: Self-pay | Admitting: Lab

## 2013-01-09 ENCOUNTER — Ambulatory Visit (HOSPITAL_BASED_OUTPATIENT_CLINIC_OR_DEPARTMENT_OTHER): Payer: Medicare Other | Admitting: Oncology

## 2013-01-09 ENCOUNTER — Encounter: Payer: Self-pay | Admitting: Oncology

## 2013-01-09 VITALS — BP 159/79 | HR 82 | Temp 97.7°F | Resp 18 | Ht 63.0 in | Wt 136.3 lb

## 2013-01-09 DIAGNOSIS — C182 Malignant neoplasm of ascending colon: Secondary | ICD-10-CM

## 2013-01-09 DIAGNOSIS — Z85038 Personal history of other malignant neoplasm of large intestine: Secondary | ICD-10-CM

## 2013-01-09 LAB — CBC WITH DIFFERENTIAL/PLATELET
Eosinophils Absolute: 0.1 10*3/uL (ref 0.0–0.5)
MCV: 89.1 fL (ref 79.5–101.0)
MONO#: 0.6 10*3/uL (ref 0.1–0.9)
MONO%: 7.9 % (ref 0.0–14.0)
NEUT#: 4.1 10*3/uL (ref 1.5–6.5)
RBC: 4.58 10*6/uL (ref 3.70–5.45)
RDW: 13.5 % (ref 11.2–14.5)
WBC: 7 10*3/uL (ref 3.9–10.3)
lymph#: 2.2 10*3/uL (ref 0.9–3.3)

## 2013-01-09 LAB — COMPREHENSIVE METABOLIC PANEL (CC13)
ALT: 12 U/L (ref 0–55)
Alkaline Phosphatase: 57 U/L (ref 40–150)
CO2: 25 mEq/L (ref 22–29)
Sodium: 141 mEq/L (ref 136–145)
Total Bilirubin: 0.35 mg/dL (ref 0.20–1.20)
Total Protein: 7.3 g/dL (ref 6.4–8.3)

## 2013-01-09 LAB — CEA: CEA: 1.3 ng/mL (ref 0.0–5.0)

## 2013-01-09 NOTE — Progress Notes (Signed)
[facility]  Telephone:(336) [phone] Fax:(336) [phone]   OFFICE PROGRESS NOTE  CC  [name]  Dr. 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   PRIOR THERAPY:   #1 patient underwent a hemicolectomy for a stage II A. Adenocarcinoma. This was performed in July 2009.   #2 patient has been on observation only.   CURRENT THERAPY:Observation   INTERVAL HISTORY:  Joanne Gomez 79 y.o. female returns for Followup visit today.  she was clinically stable,without any significant problems, such as  Fevers, chills, night sweats, headaches, shortness of breath, chest pains or  palpitations.Denies abdominal pain.She has intermittent hemorrhoidal discomfort relieved with stool softeners. She denies any back pain. She has noticed increased vaginal dryness, for which she has recently been started on Estrace vaginal cream with some relief of her symptoms. No other new medical issues. Joanne Gomez of the 10 point review of systems is negative.    Past Medical History  Diagnosis Date  . Cancer 11/2007    colon stage II  . H/O colon cancer, stage II 11/15/2011  . Hypothyroidism     Past Surgical History  Procedure Laterality Date  . Cholecystectomy    . Hemocolectomy  2009    Current Outpatient Prescriptions  Medication Sig Dispense Refill  . Biotin 2500 MCG CAPS Take 1 each by mouth.      . calcium carbonate 1250 MG capsule Take 1,250 mg by mouth 2 (two) times daily with a meal.        . esomeprazole (NEXIUM) 40 MG capsule Take 40 mg by mouth daily before breakfast.        . estradiol (ESTRACE) 0.1 MG/GM vaginal cream Place 2 g vaginally daily.      . Multiple Vitamin (MULTIVITAMIN) tablet Take 1 tablet by mouth daily.        . Probiotic Product (PROBIOTIC FORMULA PO) Take 1 each by mouth.        . thyroid (ARMOUR) 60 MG tablet Take 1 tablet (60 mg total) by mouth daily.   30 tablet  3  . aspirin 81 MG tablet Take 81 mg by mouth daily.       No current facility-administered medications for this visit.    ALLERGIES:  has No Known Allergies.  REVIEW OF SYSTEMS:  No respiratory or cardiac complaints.  The rest of the 14-point review of system was negative.   Filed Vitals:   01/09/13 0905  BP: 159/79  Pulse: 82  Temp: 97.7 F (36.5 C)  Resp: 18   Wt Readings from Last 3 Encounters:  01/09/13 136 lb 4.8 oz (61.825 kg)  10/01/12 137 lb (62.143 kg)  06/16/12 141 lb (63.957 kg)     PHYSICAL EXAMINATION:  BP 159/79  Pulse 82  Temp(Src) 97.7 F (36.5 C) (Oral)  Resp 18  Ht 5\' 3"  (1.6 m)  Wt 136 lb 4.8 oz (61.825 kg)  BMI 24.15 kg/m2  SpO2 98% GENERAL: Well developed, well nourished, in no acute distress.  EENT: No ocular or oral lesions. No stomatitis.  RESPIRATORY: Lungs are clear to auscultation bilaterally with normal respiratory movement and no accessory muscle use. CARDIAC: No murmur, rub or tachycardia. No upper or lower extremity edema.  GI: Abdomen is soft, no palpable hepatosplenomegaly. No fluid wave. No tenderness. Musculoskeletal: No kyphosis, no tenderness over the spine, ribs or hips. Lymph: No cervical, infraclavicular, axillary or inguinal  adenopathy. Neuro: No focal neurological deficits. Psych: Alert and oriented X 3, appropriate mood and affect.   ECOG PERFORMANCE STATUS: 0 - Asymptomatic       LABORATORY/RADIOLOGY DATA:  Lab Results  Component Value Date   WBC 7.0 01/09/2013   HGB 13.2 01/09/2013   HCT 40.8 01/09/2013   PLT 215 01/09/2013   GLUCOSE 84 01/09/2013   ALKPHOS 57 01/09/2013   ALT 12 01/09/2013   AST 15 01/09/2013   NA 141 01/09/2013   K 4.1 01/09/2013   CL 105 10/01/2012   CREATININE 0.7 01/09/2013   BUN 9.5 01/09/2013   CO2 25 01/09/2013    No results found.     ASSESSMENT   Joanne Gomez 78 y.o. female with   #1 stage IIa adenocarcinoma of colon diagnosed July 2009 status post resection and then  observation only.    PLAN:  #1 patient without any evidence of cancer. She has completed 5 years of surveillance and now will see me 1 a year!  All questions were answered. The patient knows to call the clinic with any problems, questions or concerns. We can certainly see the patient much sooner if necessary. Case discussed with Dr. Orvan Seen.

## 2013-01-09 NOTE — Telephone Encounter (Signed)
appts made and printed...td 

## 2013-02-10 ENCOUNTER — Ambulatory Visit: Payer: Self-pay | Admitting: Family Medicine

## 2013-02-12 ENCOUNTER — Ambulatory Visit: Payer: Self-pay | Admitting: Family Medicine

## 2013-02-25 ENCOUNTER — Encounter: Payer: Self-pay | Admitting: Family Medicine

## 2013-03-10 ENCOUNTER — Telehealth: Payer: Self-pay | Admitting: Family Medicine

## 2013-03-10 ENCOUNTER — Ambulatory Visit (INDEPENDENT_AMBULATORY_CARE_PROVIDER_SITE_OTHER): Payer: Medicare Other | Admitting: *Deleted

## 2013-03-10 VITALS — Temp 97.2°F

## 2013-03-10 DIAGNOSIS — Z23 Encounter for immunization: Secondary | ICD-10-CM

## 2013-03-10 NOTE — Telephone Encounter (Signed)
She wants to know about her Bone density results and if she is to continue Prolia

## 2013-03-11 NOTE — Telephone Encounter (Signed)
Pt's bone density has gotten better but she is still osteoporotic and needs to continue Prolia. Pt is aware.

## 2013-03-26 ENCOUNTER — Ambulatory Visit (INDEPENDENT_AMBULATORY_CARE_PROVIDER_SITE_OTHER): Payer: Medicare Other | Admitting: Family Medicine

## 2013-03-26 DIAGNOSIS — M810 Age-related osteoporosis without current pathological fracture: Secondary | ICD-10-CM

## 2013-03-26 MED ORDER — DENOSUMAB 60 MG/ML ~~LOC~~ SOLN
60.0000 mg | Freq: Once | SUBCUTANEOUS | Status: AC
  Administered 2013-03-26: 60 mg via SUBCUTANEOUS

## 2013-04-28 ENCOUNTER — Encounter: Payer: Self-pay | Admitting: Family Medicine

## 2013-05-08 ENCOUNTER — Other Ambulatory Visit: Payer: Medicare Other

## 2013-05-08 DIAGNOSIS — Z79899 Other long term (current) drug therapy: Secondary | ICD-10-CM

## 2013-05-08 DIAGNOSIS — E039 Hypothyroidism, unspecified: Secondary | ICD-10-CM

## 2013-05-08 DIAGNOSIS — E782 Mixed hyperlipidemia: Secondary | ICD-10-CM

## 2013-05-08 LAB — COMPREHENSIVE METABOLIC PANEL
Alkaline Phosphatase: 62 U/L (ref 39–117)
CO2: 30 mEq/L (ref 19–32)
Creat: 0.76 mg/dL (ref 0.50–1.10)
Glucose, Bld: 90 mg/dL (ref 70–99)
Total Bilirubin: 0.5 mg/dL (ref 0.3–1.2)

## 2013-05-08 LAB — LIPID PANEL
HDL: 61 mg/dL (ref >39)
LDL Cholesterol: 126 mg/dL — ABNORMAL HIGH (ref 0–99)
Total CHOL/HDL Ratio: 3.4 Ratio
Triglycerides: 92 mg/dL (ref <150)

## 2013-05-08 LAB — TSH: TSH: 2.614 u[IU]/mL (ref 0.350–4.500)

## 2013-05-11 ENCOUNTER — Ambulatory Visit (INDEPENDENT_AMBULATORY_CARE_PROVIDER_SITE_OTHER): Payer: Medicare Other | Admitting: Family Medicine

## 2013-05-11 ENCOUNTER — Encounter: Payer: Self-pay | Admitting: Family Medicine

## 2013-05-11 VITALS — BP 140/86 | HR 68 | Temp 98.0°F | Resp 18 | Ht 62.5 in | Wt 137.0 lb

## 2013-05-11 DIAGNOSIS — C189 Malignant neoplasm of colon, unspecified: Secondary | ICD-10-CM

## 2013-05-11 DIAGNOSIS — Z23 Encounter for immunization: Secondary | ICD-10-CM

## 2013-05-11 DIAGNOSIS — Z0000 Encounter for general adult medical examination without abnormal findings: Secondary | ICD-10-CM

## 2013-05-11 MED ORDER — ESOMEPRAZOLE MAGNESIUM 40 MG PO CPDR: 40.0000 mg | DELAYED_RELEASE_CAPSULE | Freq: Every day | ORAL | Status: DC

## 2013-05-11 MED ORDER — FLUTICASONE PROPIONATE 50 MCG/ACT NA SUSP: 2.0000 | Freq: Every day | NASAL | Status: DC

## 2013-05-11 MED ORDER — ESTRADIOL 0.1 MG/GM VA CREA: 2.0000 g | TOPICAL_CREAM | VAGINAL | Status: DC

## 2013-05-11 MED ORDER — THYROID 60 MG PO TABS: 60.0000 mg | ORAL_TABLET | Freq: Every day | ORAL | Status: DC

## 2013-05-11 NOTE — Progress Notes (Signed)
Subjective:    Patient ID: Joanne Gomez, female    DOB: [birth year], 79 y.o.   MRN: [id number]  HPI Patient is here today for complete physical exam. She has had Pneumovax 23. She had Zostavax in September 2012. She had her last tetanus shot in 1997. She had her flu shot earlier this year. She is due for Prevnar 13. She is due TdaP.  Her last colonoscopy was in 2012. Given her history of colon cancer she is due again next year. She gets her mammogram in March of 2015.  She gets her bone density in March of 2015. She is not due for a Pap smear due to her history of hysterectomy. She is here today for a complete physical exam she denies any problems other than sinus irritation and rhinorrhea. Her most recent labwork as listed below: Lab on 05/08/2013  Component Date Value Range Status  . TSH 05/08/2013 2.614  0.350 - 4.500 uIU/mL Final  . Cholesterol 05/08/2013 205* 0 - 200 mg/dL Final   Comment: ATP III Classification:                                < 200        mg/dL        Desirable                               200 - 239     mg/dL        Borderline High                               >= 240        mg/dL        High                             . Triglycerides 05/08/2013 92  <150 mg/dL Final  . HDL 40/98/1191 61  >39 mg/dL Final  . Total CHOL/HDL Ratio 05/08/2013 3.4   Final  . VLDL 05/08/2013 18  0 - 40 mg/dL Final  . LDL Cholesterol 05/08/2013 126* 0 - 99 mg/dL Final   Comment:                            Total Cholesterol/HDL Ratio:CHD Risk                                                 Coronary Heart Disease Risk Table                                                                 Men       Women                                   1/2 Average Risk  3.4        3.3                                       Average Risk              5.0        4.4                                    2X Average Risk              9.6        7.1                                    3X Average Risk              23.4       11.0                          Use the calculated Patient Ratio above and the CHD Risk table                           to determine the patient's CHD Risk.                          ATP III Classification (LDL):                                < 100        mg/dL         Optimal                               100 - 129     mg/dL         Near or Above Optimal                               130 - 159     mg/dL         Borderline High                               160 - 189     mg/dL         High                                > 190        mg/dL         Very High                             . Sodium 05/08/2013 143  135 - 145 mEq/L Final  . Potassium 05/08/2013 4.6  3.5 - 5.3 mEq/L Final  . Chloride 05/08/2013 103  96 - 112 mEq/L Final  . CO2 05/08/2013 30  19 -  32 mEq/L Final  . Glucose, Bld 05/08/2013 90  70 - 99 mg/dL Final  . BUN 21/30/8657 10  6 - 23 mg/dL Final  . Creat 84/69/6295 0.76  0.50 - 1.10 mg/dL Final  . Total Bilirubin 05/08/2013 0.5  0.3 - 1.2 mg/dL Final  . Alkaline Phosphatase 05/08/2013 62  39 - 117 U/L Final  . AST 05/08/2013 18  0 - 37 U/L Final  . ALT 05/08/2013 13  0 - 35 U/L Final  . Total Protein 05/08/2013 7.4  6.0 - 8.3 g/dL Final  . Albumin 28/41/3244 4.0  3.5 - 5.2 g/dL Final  . Calcium 05/30/7251 10.1  8.4 - 10.5 mg/dL Final    Past Medical History  Diagnosis Date  . Cancer 11/2007    colon stage II  . H/O colon cancer, stage II 11/15/2011  . Hypothyroidism    Current Outpatient Prescriptions on File Prior to Visit  Medication Sig Dispense Refill  . aspirin 81 MG tablet Take 81 mg by mouth daily.      . Biotin 2500 MCG CAPS Take 1 each by mouth.      . calcium carbonate 1250 MG capsule Take 1,250 mg by mouth 2 (two) times daily with a meal.        . Multiple Vitamin (MULTIVITAMIN) tablet Take 1 tablet by mouth daily.        . Probiotic Product (PROBIOTIC FORMULA PO) Take 1 each by mouth.         No current facility-administered  medications on file prior to visit.   Past Surgical History  Procedure Laterality Date  . Cholecystectomy    . Hemocolectomy  2009  . Abdominal hysterectomy    . Appendectomy     No Known Allergies History   Social History  . Marital Status: Married    Spouse Name: N/A    Number of Children: N/A  . Years of Education: N/A   Occupational History  . Not on file.   Social History Main Topics  . Smoking status: Never Smoker   . Smokeless tobacco: Never Used  . Alcohol Use: No  . Drug Use: No  . Sexual Activity: Yes     Comment: Married to [place], retired.   Other Topics Concern  . Not on file   Social History Narrative  . No narrative on file      Review of Systems  All other systems reviewed and are negative.       Objective:   Physical Exam  Vitals reviewed. Constitutional: She is oriented to person, place, and time. She appears well-developed and well-nourished. No distress.  HENT:  Head: Normocephalic and atraumatic.  Right Ear: External ear normal.  Left Ear: External ear normal.  Nose: Nose normal.  Mouth/Throat: Oropharynx is clear and moist. No oropharyngeal exudate.  Eyes: Conjunctivae and EOM are normal. Pupils are equal, round, and reactive to light. Right eye exhibits no discharge. Left eye exhibits no discharge. No scleral icterus.  Neck: Normal range of motion. Neck supple. No JVD present. No tracheal deviation present. No thyromegaly present.  Cardiovascular: Normal rate, regular rhythm and normal heart sounds.  Exam reveals no gallop and no friction rub.   No murmur heard. Pulmonary/Chest: Effort normal and breath sounds normal. No stridor. No respiratory distress. She has no wheezes. She has no rales. She exhibits no tenderness.  Abdominal: Soft. Bowel sounds are normal. She exhibits no distension and no mass. There is no tenderness. There is no rebound and  no guarding.  Musculoskeletal: Normal range of motion. She exhibits no edema and no  tenderness.  Lymphadenopathy:    She has no cervical adenopathy.  Neurological: She is alert and oriented to person, place, and time. She has normal reflexes. She displays normal reflexes. No cranial nerve deficit. She exhibits normal muscle tone. Coordination normal.  Skin: Skin is warm. No rash noted. She is not diaphoretic. No erythema. No pallor.  Psychiatric: She has a normal mood and affect. Her behavior is normal. Judgment and thought content normal.          Assessment & Plan:    1. Routine general medical examination at a health care facility Physical exam is completely normal. The patient will go for her mammogram in the spring patient that her bone density in the spring. She is due for a colonoscopy next year. Her most recent labs are excellent. Her blood pressure line although she has an element of white coat hypertension. She is currently receiving prolia every 6 months for osteoporosis. She received Prevnar 13 today. I recommended the tdaP.  Patient deferred the immunization for now until she is able to check on the price.

## 2013-06-02 ENCOUNTER — Encounter: Payer: Self-pay | Admitting: Family Medicine

## 2013-06-02 ENCOUNTER — Ambulatory Visit (INDEPENDENT_AMBULATORY_CARE_PROVIDER_SITE_OTHER): Payer: Medicare Other | Admitting: Family Medicine

## 2013-06-02 VITALS — BP 162/90 | HR 76 | Temp 97.2°F | Resp 16 | Ht 62.5 in | Wt 140.0 lb

## 2013-06-02 DIAGNOSIS — Z79890 Hormone replacement therapy (postmenopausal): Secondary | ICD-10-CM

## 2013-06-02 DIAGNOSIS — M5432 Sciatica, left side: Secondary | ICD-10-CM

## 2013-06-02 DIAGNOSIS — M5430 Sciatica, unspecified side: Secondary | ICD-10-CM

## 2013-06-02 NOTE — Progress Notes (Signed)
Subjective:    Patient ID: Joanne Gomez, female    DOB: [birth year], 80 y.o.   MRN: [id number]  HPI Patient reports 4 weeks of pain originating in her left gluteus and radiating down the posterior aspect of her left leg to her left calf. The pain is described as a dull deep ache and occasionally as a burning, "pins and needles" pain.  She is also requesting a referral to a gynecologist. She will encompassing a gynecologist to discuss hormone replacement therapy.  Fortunately over the last week, the pain in her left leg has started to improve. It was a daily occurrence and is now more intermittent and seems to be gradually resolving. Past Medical History  Diagnosis Date  . Cancer 11/2007    colon stage II  . H/O colon cancer, stage II 11/15/2011  . Hypothyroidism    Current Outpatient Prescriptions on File Prior to Visit  Medication Sig Dispense Refill  . aspirin 81 MG tablet Take 81 mg by mouth daily.      . Biotin 2500 MCG CAPS Take 1 each by mouth.      . calcium carbonate 1250 MG capsule Take 1,250 mg by mouth 2 (two) times daily with a meal.        . denosumab (PROLIA) 60 MG/ML SOLN injection Inject 60 mg into the skin every 6 (six) months. Administer in upper arm, thigh, or abdomen      . esomeprazole (NEXIUM) 40 MG capsule Take 1 capsule (40 mg total) by mouth daily before breakfast.  90 capsule  3  . fluticasone (FLONASE) 50 MCG/ACT nasal spray Place 2 sprays into both nostrils daily.  16 g  6  . Multiple Vitamin (MULTIVITAMIN) tablet Take 1 tablet by mouth daily.        . Probiotic Product (PROBIOTIC FORMULA PO) Take 1 each by mouth.        . thyroid (ARMOUR) 60 MG tablet Take 1 tablet (60 mg total) by mouth daily.  90 tablet  3  . estradiol (ESTRACE) 0.1 MG/GM vaginal cream Place 6.83 Applicatorfuls vaginally 3 (three) times a week.  42.5 g  3   No current facility-administered medications on file prior to visit.   No Known Allergies History   Social History  . Marital  Status: Married    Spouse Name: N/A    Number of Children: N/A  . Years of Education: N/A   Occupational History  . Not on file.   Social History Main Topics  . Smoking status: Never Smoker   . Smokeless tobacco: Never Used  . Alcohol Use: No  . Drug Use: No  . Sexual Activity: Yes     Comment: Married to [place], retired.   Other Topics Concern  . Not on file   Social History Narrative  . No narrative on file      Review of Systems  All other systems reviewed and are negative.       Objective:   Physical Exam  Vitals reviewed. Constitutional: She is oriented to person, place, and time.  Cardiovascular: Normal rate and regular rhythm.   Pulmonary/Chest: Effort normal and breath sounds normal.  Musculoskeletal: Normal range of motion. She exhibits no tenderness.       Lumbar back: She exhibits normal range of motion, no tenderness, no bony tenderness, no pain and no spasm.  Neurological: She is alert and oriented to person, place, and time. She has normal reflexes. She displays normal reflexes. No cranial nerve  deficit. She exhibits normal muscle tone. Coordination normal.   muscle strength is 5 over 5 equal and symmetric in both legs. The patient has a negative straight leg raise bilaterally. There is no tenderness to palpation around the lumbar spinous processes or in the lumbar paraspinal muscles.        Assessment & Plan:  1. Sciatica neuralgia, left Her symptoms sound like left-sided sciatica. Anticipate spontaneous gradual resolution of the next 2 weeks. I recommended ibuprofen 800 mg every 8 hours and tincture of time. If the patient's symptoms worsen or if they're no better in 2 weeks I would proceed with imaging of the lumbar spine. The patient is comfortable with this plan.  2. Hormone replacement therapy (postmenopausal) - Ambulatory referral to Obstetrics / Gynecology

## 2013-06-04 ENCOUNTER — Other Ambulatory Visit: Payer: Self-pay | Admitting: Obstetrics and Gynecology

## 2013-09-18 ENCOUNTER — Telehealth: Payer: Self-pay | Admitting: Family Medicine

## 2013-09-18 NOTE — Telephone Encounter (Signed)
Have submitted insurance verification request form through [facility]

## 2013-09-18 NOTE — Telephone Encounter (Signed)
Pt is wanting to let us know that she will be due around 5/1 for her Prolia shot and she is wanting [place] to call her once we have it in. Call back number is [phone]

## 2013-09-23 NOTE — Telephone Encounter (Signed)
Rec'd insurance confirmation from [facility].  No Prior Auth required.  Prolia ordered from pharmacy.  Called patient and made aware.  Will notify when medication is delivered

## 2013-09-30 ENCOUNTER — Ambulatory Visit (INDEPENDENT_AMBULATORY_CARE_PROVIDER_SITE_OTHER): Payer: Medicare Other | Admitting: *Deleted

## 2013-09-30 DIAGNOSIS — M810 Age-related osteoporosis without current pathological fracture: Secondary | ICD-10-CM

## 2013-09-30 MED ORDER — DENOSUMAB 60 MG/ML ~~LOC~~ SOLN
60.0000 mg | Freq: Once | SUBCUTANEOUS | Status: AC
  Administered 2013-09-30: 60 mg via SUBCUTANEOUS

## 2013-11-13 ENCOUNTER — Other Ambulatory Visit: Payer: Medicare Other

## 2013-11-13 DIAGNOSIS — Z85038 Personal history of other malignant neoplasm of large intestine: Secondary | ICD-10-CM

## 2013-11-13 DIAGNOSIS — Z79899 Other long term (current) drug therapy: Secondary | ICD-10-CM

## 2013-11-13 DIAGNOSIS — M810 Age-related osteoporosis without current pathological fracture: Secondary | ICD-10-CM

## 2013-11-13 DIAGNOSIS — E039 Hypothyroidism, unspecified: Secondary | ICD-10-CM

## 2013-11-13 DIAGNOSIS — E785 Hyperlipidemia, unspecified: Secondary | ICD-10-CM

## 2013-11-13 LAB — COMPLETE METABOLIC PANEL WITH GFR
ALT: 16 U/L (ref 0–35)
AST: 21 U/L (ref 0–37)
Albumin: 4.5 g/dL (ref 3.5–5.2)
Alkaline Phosphatase: 49 U/L (ref 39–117)
BILIRUBIN TOTAL: 0.6 mg/dL (ref 0.2–1.2)
BUN: 7 mg/dL (ref 6–23)
CO2: 29 meq/L (ref 19–32)
Calcium: 9.5 mg/dL (ref 8.4–10.5)
Chloride: 102 mEq/L (ref 96–112)
Creat: 0.67 mg/dL (ref 0.50–1.10)
GFR, Est Non African American: 83 mL/min
Glucose, Bld: 84 mg/dL (ref 70–99)
Potassium: 4.5 mEq/L (ref 3.5–5.3)
SODIUM: 140 meq/L (ref 135–145)
TOTAL PROTEIN: 7.4 g/dL (ref 6.0–8.3)

## 2013-11-13 LAB — LIPID PANEL
Cholesterol: 230 mg/dL — ABNORMAL HIGH (ref 0–200)
HDL: 69 mg/dL (ref >39)
LDL Cholesterol: 137 mg/dL — ABNORMAL HIGH (ref 0–99)
TRIGLYCERIDES: 121 mg/dL (ref <150)
Total CHOL/HDL Ratio: 3.3 Ratio
VLDL: 24 mg/dL (ref 0–40)

## 2013-11-13 LAB — CBC WITH DIFFERENTIAL/PLATELET
BASOS ABS: 0.1 10*3/uL (ref 0.0–0.1)
Basophils Relative: 1 % (ref 0–1)
Eosinophils Absolute: 0.1 10*3/uL (ref 0.0–0.7)
Eosinophils Relative: 2 % (ref 0–5)
HEMATOCRIT: 40.5 % (ref 36.0–46.0)
Hemoglobin: 13.7 g/dL (ref 12.0–15.0)
LYMPHS PCT: 35 % (ref 12–46)
Lymphs Abs: 2.5 10*3/uL (ref 0.7–4.0)
MCH: 29.5 pg (ref 26.0–34.0)
MCHC: 33.8 g/dL (ref 30.0–36.0)
MCV: 87.1 fL (ref 78.0–100.0)
MONO ABS: 0.4 10*3/uL (ref 0.1–1.0)
Monocytes Relative: 6 % (ref 3–12)
NEUTROS ABS: 4 10*3/uL (ref 1.7–7.7)
Neutrophils Relative %: 56 % (ref 43–77)
Platelets: 233 10*3/uL (ref 150–400)
RBC: 4.65 MIL/uL (ref 3.87–5.11)
RDW: 14.1 % (ref 11.5–15.5)
WBC: 7.2 10*3/uL (ref 4.0–10.5)

## 2013-11-13 LAB — TSH: TSH: 3.621 u[IU]/mL (ref 0.350–4.500)

## 2013-11-14 LAB — VITAMIN D 25 HYDROXY (VIT D DEFICIENCY, FRACTURES): VIT D 25 HYDROXY: 45 ng/mL (ref 30–89)

## 2013-11-17 ENCOUNTER — Encounter: Payer: Self-pay | Admitting: *Deleted

## 2013-11-17 ENCOUNTER — Ambulatory Visit (INDEPENDENT_AMBULATORY_CARE_PROVIDER_SITE_OTHER): Payer: Medicare Other | Admitting: Family Medicine

## 2013-11-17 ENCOUNTER — Encounter: Payer: Self-pay | Admitting: Family Medicine

## 2013-11-17 ENCOUNTER — Ambulatory Visit
Admission: RE | Admit: 2013-11-17 | Discharge: 2013-11-17 | Disposition: A | Discharge status: Home / Self Care | Payer: Medicare Other | Source: Ambulatory Visit | Attending: Family Medicine | Admitting: Family Medicine

## 2013-11-17 VITALS — BP 164/82 | HR 80 | Temp 97.0°F | Resp 16 | Ht 62.5 in | Wt 136.0 lb

## 2013-11-17 DIAGNOSIS — E785 Hyperlipidemia, unspecified: Secondary | ICD-10-CM

## 2013-11-17 DIAGNOSIS — M810 Age-related osteoporosis without current pathological fracture: Secondary | ICD-10-CM | POA: Insufficient documentation

## 2013-11-17 DIAGNOSIS — M542 Cervicalgia: Secondary | ICD-10-CM

## 2013-11-17 DIAGNOSIS — E039 Hypothyroidism, unspecified: Secondary | ICD-10-CM

## 2013-11-17 DIAGNOSIS — Z1239 Encounter for other screening for malignant neoplasm of breast: Secondary | ICD-10-CM

## 2013-11-17 NOTE — Progress Notes (Signed)
Subjective:    Patient ID: Joanne Gomez, female    DOB: Jun 06, 1930, 80 y.o.   MRN: [id number]  HPI  Patient is a very pleasant 80 year old white female who is here today for a six-month followup. She has a history of hypothyroidism. She also has borderline hyperlipidemia as well as osteoporosis. She is currently tolerating prolia injections every 6 months. She is next is her repeat bone density in T12 and 16. Her most recent labwork as listed below: Lab on 11/13/2013  Component Date Value Ref Range Status  . WBC 11/13/2013 7.2  4.0 - 10.5 K/uL Final  . RBC 11/13/2013 4.65  3.87 - 5.11 MIL/uL Final  . Hemoglobin 11/13/2013 13.7  12.0 - 15.0 g/dL Final  . HCT 11/13/2013 40.5  36.0 - 46.0 % Final  . MCV 11/13/2013 87.1  78.0 - 100.0 fL Final  . MCH 11/13/2013 29.5  26.0 - 34.0 pg Final  . MCHC 11/13/2013 33.8  30.0 - 36.0 g/dL Final  . RDW 11/13/2013 14.1  11.5 - 15.5 % Final  . Platelets 11/13/2013 233  150 - 400 K/uL Final  . Neutrophils Relative % 11/13/2013 56  43 - 77 % Final  . Neutro Abs 11/13/2013 4.0  1.7 - 7.7 K/uL Final  . Lymphocytes Relative 11/13/2013 35  12 - 46 % Final  . Lymphs Abs 11/13/2013 2.5  0.7 - 4.0 K/uL Final  . Monocytes Relative 11/13/2013 6  3 - 12 % Final  . Monocytes Absolute 11/13/2013 0.4  0.1 - 1.0 K/uL Final  . Eosinophils Relative 11/13/2013 2  0 - 5 % Final  . Eosinophils Absolute 11/13/2013 0.1  0.0 - 0.7 K/uL Final  . Basophils Relative 11/13/2013 1  0 - 1 % Final  . Basophils Absolute 11/13/2013 0.1  0.0 - 0.1 K/uL Final  . Smear Review 11/13/2013 Criteria for review not met   Final  . Cholesterol 11/13/2013 230* 0 - 200 mg/dL Final   Comment: ATP III Classification:                                < 200        mg/dL        Desirable                               200 - 239     mg/dL        Borderline High                               >= 240        mg/dL        High                             . Triglycerides 11/13/2013 121  <150 mg/dL Final   . HDL 11/13/2013 69  >39 mg/dL Final  . Total CHOL/HDL Ratio 11/13/2013 3.3   Final  . VLDL 11/13/2013 24  0 - 40 mg/dL Final  . LDL Cholesterol 11/13/2013 137* 0 - 99 mg/dL Final   Comment:                            Total Cholesterol/HDL Ratio:CHD Risk  Coronary Heart Disease Risk Table                                                                 Men       Women                                   1/2 Average Risk              3.4        3.3                                       Average Risk              5.0        4.4                                    2X Average Risk              9.6        7.1                                    3X Average Risk             23.4       11.0                          Use the calculated Patient Ratio above and the CHD Risk table                           to determine the patient's CHD Risk.                          ATP III Classification (LDL):                                < 100        mg/dL         Optimal                               100 - 129     mg/dL         Near or Above Optimal                               130 - 159     mg/dL         Borderline High                               160 - 189     mg/dL           High                                > 190        mg/dL         Very High                             . TSH 11/13/2013 3.621  0.350 - 4.500 uIU/mL Final  . Vit D, 25-Hydroxy 11/13/2013 45  30 - 89 ng/mL Final   Comment: This assay accurately quantifies Vitamin D, which is the sum of the                          25-Hydroxy forms of Vitamin D2 and D3.  Studies have shown that the                          optimum concentration of 25-Hydroxy Vitamin D is 30 ng/mL or higher.                           Concentrations of Vitamin D between 20 and 29 ng/mL are considered to                          be insufficient and concentrations less than 20 ng/mL are considered                          to be  deficient for Vitamin D.  . Sodium 11/13/2013 140  135 - 145 mEq/L Final  . Potassium 11/13/2013 4.5  3.5 - 5.3 mEq/L Final  . Chloride 11/13/2013 102  96 - 112 mEq/L Final  . CO2 11/13/2013 29  19 - 32 mEq/L Final  . Glucose, Bld 11/13/2013 84  70 - 99 mg/dL Final  . BUN 11/13/2013 7  6 - 23 mg/dL Final  . Creat 11/13/2013 0.67  0.50 - 1.10 mg/dL Final  . Total Bilirubin 11/13/2013 0.6  0.2 - 1.2 mg/dL Final  . Alkaline Phosphatase 11/13/2013 49  39 - 117 U/L Final  . AST 11/13/2013 21  0 - 37 U/L Final  . ALT 11/13/2013 16  0 - 35 U/L Final  . Total Protein 11/13/2013 7.4  6.0 - 8.3 g/dL Final  . Albumin 11/13/2013 4.5  3.5 - 5.2 g/dL Final  . Calcium 11/13/2013 9.5  8.4 - 10.5 mg/dL Final  . GFR, Est African American 11/13/2013 >89   Final  . GFR, Est Non African American 11/13/2013 83   Final   Comment:                            The estimated GFR is a calculation valid for adults (>=45 years old)                          that uses the CKD-EPI algorithm to adjust for age and sex. It is                            not to be used for children, pregnant women, hospitalized patients,  patients on dialysis, or with rapidly changing kidney function.                          According to the NKDEP, eGFR >89 is normal, 60-89 shows mild                          impairment, 30-59 shows moderate impairment, 15-29 shows severe                          impairment and <15 is ESRD.                              Her TSH is within the therapeutic range. Her cholesterol is borderline. I am very concerned that her blood pressure today. It is extremely elevated 164/82. She states her blood pressure home ranges 06/17/1928 over 80s. She denies any chest pain shortness of breath dyspnea on exertion. She does complain of pain in her neck particularly over the C7 spinous process. This is been going on for possibly one week. She's also having muscle spasms in the same area. She denies  any injury to the neck. Past Medical History  Diagnosis Date  . Cancer 11/2007    colon stage II  . H/O colon cancer, stage II 11/15/2011  . Hypothyroidism   . Hyperlipidemia   . Osteoporosis    Past Surgical History  Procedure Laterality Date  . Cholecystectomy    . Hemocolectomy  2009  . Abdominal hysterectomy    . Appendectomy     Current Outpatient Prescriptions on File Prior to Visit  Medication Sig Dispense Refill  . aspirin 81 MG tablet Take 81 mg by mouth daily.      . Biotin 2500 MCG CAPS Take 1 each by mouth.      . calcium carbonate 1250 MG capsule Take 1,250 mg by mouth 2 (two) times daily with a meal.        . denosumab (PROLIA) 60 MG/ML SOLN injection Inject 60 mg into the skin every 6 (six) months. Administer in upper arm, thigh, or abdomen      . esomeprazole (NEXIUM) 40 MG capsule Take 1 capsule (40 mg total) by mouth daily before breakfast.  90 capsule  3  . fluticasone (FLONASE) 50 MCG/ACT nasal spray Place 2 sprays into both nostrils daily.  16 g  6  . Multiple Vitamin (MULTIVITAMIN) tablet Take 1 tablet by mouth daily.        . Probiotic Product (PROBIOTIC FORMULA PO) Take 1 each by mouth.        . thyroid (ARMOUR) 60 MG tablet Take 1 tablet (60 mg total) by mouth daily.  90 tablet  3   No current facility-administered medications on file prior to visit.   No Known Allergies History   Social History  . Marital Status: Married    Spouse Name: N/A    Number of Children: N/A  . Years of Education: N/A   Occupational History  . Not on file.   Social History Main Topics  . Smoking status: Never Smoker   . Smokeless tobacco: Never Used  . Alcohol Use: No  . Drug Use: No  . Sexual Activity: Yes     Comment: Married to [place], retired.   Other Topics Concern  . Not on file   Social History  Narrative  . No narrative on file     Review of Systems  All other systems reviewed and are negative.      Objective:   Physical Exam  Vitals  reviewed. Constitutional: She appears well-developed and well-nourished.  Eyes: Conjunctivae are normal.  Neck: No thyromegaly present.  Cardiovascular: Normal rate, regular rhythm and normal heart sounds.   No murmur heard. Pulmonary/Chest: Effort normal and breath sounds normal. No respiratory distress. She has no wheezes. She has no rales.  Abdominal: Soft. Bowel sounds are normal. She exhibits no distension. There is no tenderness. There is no rebound.  Musculoskeletal: She exhibits no edema.          Assessment & Plan:  1. Unspecified hypothyroidism TSH is within therapeutic range. Continue her current dose. Recheck TSH in 6 months.  2. Osteoporosis, unspecified Continue prolia.  Repeat bone density in one year  3. HLD (hyperlipidemia) We further discussed a low saturated fat diet. I am very concerned by her blood pressure today. As the patient check her blood pressure daily for the next week and provided in values. If they're consistently greater than 140/90 on his outpatient medication.  4. Neck pain Begin by obtaining a C-spine x-ray. - DG Cervical Spine Complete; Future  5. Breast cancer screening - MM Digital Screening; Future

## 2013-11-19 ENCOUNTER — Other Ambulatory Visit: Payer: Self-pay | Admitting: *Deleted

## 2013-11-19 MED ORDER — MELOXICAM 15 MG PO TABS: 15.0000 mg | ORAL_TABLET | Freq: Every day | ORAL | Status: DC

## 2013-11-20 ENCOUNTER — Telehealth: Payer: Self-pay | Admitting: *Deleted

## 2013-11-20 NOTE — Telephone Encounter (Signed)
Message copied by Maureen Chatters on Fri Nov 20, 2013  2:19 PM ------      Message from: Lenore Manner      Created: Fri Nov 20, 2013  2:03 PM      Contact: [phone]       PT is needing to speak to you about a letter she had received from you. ------

## 2013-11-20 NOTE — Telephone Encounter (Signed)
Called pt in regarding the Mammo letter that sent to her and she had stated that she had looked up her last Mammo and showed that she was not due until September, I told her that the order was in the [facility] and when she was ready she can go and have done. Pt says she will schedule for Sept.

## 2013-12-07 ENCOUNTER — Encounter: Payer: Self-pay | Admitting: Family Medicine

## 2013-12-07 ENCOUNTER — Ambulatory Visit (INDEPENDENT_AMBULATORY_CARE_PROVIDER_SITE_OTHER): Payer: Medicare Other | Admitting: Family Medicine

## 2013-12-07 VITALS — BP 126/74 | HR 86 | Temp 98.0°F | Resp 16 | Ht 62.5 in | Wt 135.0 lb

## 2013-12-07 DIAGNOSIS — M542 Cervicalgia: Secondary | ICD-10-CM

## 2013-12-07 NOTE — Progress Notes (Signed)
Subjective:    Patient ID: Joanne Gomez, female    DOB: 1930/07/02, 80 y.o.   MRN: [id number]  HPI Patient at her last office visit and she is complaining about neck pain that radiated between her shoulder blades. C-spine x-ray at that time showed moderate degenerative disc disease at C5-C6 and also anterior listhesis of C4 on C5.  At the present time the patient is taking meloxicam 15 mg by mouth daily. The neck pain has improved. She is now having occasional pain in her lower back. She does have a history of sciatica. However she has never had any x-rays performed of her lower back. At the present time she states the pain is mild she can deal with it. Past Medical History  Diagnosis Date  . Cancer 11/2007    colon stage II  . H/O colon cancer, stage II 11/15/2011  . Hypothyroidism   . Hyperlipidemia   . Osteoporosis    Current Outpatient Prescriptions on File Prior to Visit  Medication Sig Dispense Refill  . aspirin 81 MG tablet Take 81 mg by mouth daily.      . Biotin 2500 MCG CAPS Take 1 each by mouth.      . calcium carbonate 1250 MG capsule Take 1,250 mg by mouth 2 (two) times daily with a meal.        . denosumab (PROLIA) 60 MG/ML SOLN injection Inject 60 mg into the skin every 6 (six) months. Administer in upper arm, thigh, or abdomen      . esomeprazole (NEXIUM) 40 MG capsule Take 1 capsule (40 mg total) by mouth daily before breakfast.  90 capsule  3  . fluticasone (FLONASE) 50 MCG/ACT nasal spray Place 2 sprays into both nostrils daily.  16 g  6  . meloxicam (MOBIC) 15 MG tablet Take 1 tablet (15 mg total) by mouth daily.  30 tablet  0  . Multiple Vitamin (MULTIVITAMIN) tablet Take 1 tablet by mouth daily.        Marland Kitchen PRESCRIPTION MEDICATION HRT (39ml) 0.02% (0.2mg /ml Cream - Applicator full three times per week.  ([place] mixes it for pt) #12 tubes      . Probiotic Product (PROBIOTIC FORMULA PO) Take 1 each by mouth.        . thyroid (ARMOUR) 60 MG tablet Take 1  tablet (60 mg total) by mouth daily.  90 tablet  3   No current facility-administered medications on file prior to visit.   No Known Allergies History   Social History  . Marital Status: Married    Spouse Name: N/A    Number of Children: N/A  . Years of Education: N/A   Occupational History  . Not on file.   Social History Main Topics  . Smoking status: Never Smoker   . Smokeless tobacco: Never Used  . Alcohol Use: No  . Drug Use: No  . Sexual Activity: Yes     Comment: Married to [place], retired.   Other Topics Concern  . Not on file   Social History Narrative  . No narrative on file      Review of Systems  All other systems reviewed and are negative.      Objective:   Physical Exam  Vitals reviewed. Cardiovascular: Normal rate and regular rhythm.   Pulmonary/Chest: Effort normal and breath sounds normal.  Musculoskeletal:       Cervical back: She exhibits decreased range of motion. She exhibits no tenderness, no bony tenderness,  no pain and no spasm.       Lumbar back: She exhibits bony tenderness, pain and spasm. She exhibits normal range of motion and no tenderness.          Assessment & Plan:  1. 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 I have recommended that she use NSAIDs on an as needed basis for neck pain. She can use Tylenol on a daily basis. We discussed the GI risk of taking NSAIDs on a daily basis. If the pain in her lower back worsens, recommend a lumbar spine xray.

## 2013-12-09 ENCOUNTER — Ambulatory Visit: Payer: Self-pay | Admitting: Family Medicine

## 2014-01-11 ENCOUNTER — Encounter: Payer: Self-pay | Admitting: Family Medicine

## 2014-01-14 ENCOUNTER — Other Ambulatory Visit: Payer: Self-pay | Admitting: Family Medicine

## 2014-01-14 ENCOUNTER — Ambulatory Visit: Payer: Self-pay | Admitting: Oncology

## 2014-01-14 ENCOUNTER — Other Ambulatory Visit: Payer: Medicare Other

## 2014-01-18 ENCOUNTER — Ambulatory Visit (HOSPITAL_BASED_OUTPATIENT_CLINIC_OR_DEPARTMENT_OTHER): Payer: Medicare Other | Admitting: Oncology

## 2014-01-18 ENCOUNTER — Telehealth: Payer: Self-pay | Admitting: Oncology

## 2014-01-18 ENCOUNTER — Ambulatory Visit: Payer: Medicare Other

## 2014-01-18 VITALS — BP 157/68 | HR 66 | Temp 97.3°F | Resp 18 | Ht 62.5 in | Wt 134.5 lb

## 2014-01-18 DIAGNOSIS — Z85038 Personal history of other malignant neoplasm of large intestine: Secondary | ICD-10-CM

## 2014-01-18 LAB — CEA: CEA: 1.9 ng/mL (ref 0.0–5.0)

## 2014-01-18 NOTE — Telephone Encounter (Signed)
gv and printed appt sched and avs for pt for Aug 2016....sent pt to lab

## 2014-01-18 NOTE — Progress Notes (Signed)
  [facility] OFFICE PROGRESS NOTE   Diagnosis: Colon cancer  INTERVAL HISTORY:   She has been followed by Dr. Humphrey Rolls since being diagnosed with colon cancer in 2009. Ms. Birkel feels well. She has irregular bowel habits, but no bleeding. Occasional "pins and needles "discomfort in the right lower abdomen. No consistent pain. Good appetite. She is scheduled for a colonoscopy next month.  Objective:  Vital signs in last 24 hours:  Blood pressure 157/68, pulse 66, temperature 97.3 F (36.3 C), temperature source Oral, resp. rate 18, height 5' 2.5" (1.588 m), weight 134 lb 8 oz (61.009 kg), SpO2 97.00%.    HEENT: Neck without mass Lymphatics: No cervical, supraclavicular, axillary, or inguinal nodes Resp: Lungs clear bilaterally Cardio: Regular rate and rhythm GI: No hepatosplenomegaly, nontender, no mass Vascular: No leg edema   Lab Results:   Lab Results  Component Value Date   CEA 1.3 01/09/2013     Medications: I have reviewed the patient's current medications.  Assessment/Plan:  1. Colon cancer, acsending colon, status post a laparoscopic assisted right hemicolectomy 12/24/2007, stage II (T3 N0)  Disposition:  Ms. Costantino remains in clinical remission from colon cancer. She has a good prognosis for a long-term disease-free survival. She plans to undergo a surveillance colonoscopy next month.  She would like to [facility] followup at [facility]. We will followup on the CEA from today. She will return for an office visit and CEA in one year.  Betsy Coder, MD  01/18/2014  9:13 AM

## 2014-01-20 ENCOUNTER — Telehealth: Payer: Self-pay | Admitting: *Deleted

## 2014-01-20 NOTE — Telephone Encounter (Signed)
Message copied by Brien Few on Wed Jan 20, 2014 11:20 AM ------      Message from: Betsy Coder B      Created: Mon Jan 18, 2014  6:01 PM       Please call patient, cea is normal ------

## 2014-01-20 NOTE — Telephone Encounter (Signed)
Called pt with normal CEA results. She voiced understanding. She wants to make Dr. Benay Spice aware that Dr. Rochel Brome [facility]) did her surgery in 2009.

## 2014-03-08 ENCOUNTER — Ambulatory Visit (INDEPENDENT_AMBULATORY_CARE_PROVIDER_SITE_OTHER): Payer: Medicare Other | Admitting: *Deleted

## 2014-03-08 DIAGNOSIS — Z23 Encounter for immunization: Secondary | ICD-10-CM

## 2014-03-25 ENCOUNTER — Encounter: Payer: Self-pay | Admitting: Cardiovascular Disease

## 2014-03-25 ENCOUNTER — Ambulatory Visit (INDEPENDENT_AMBULATORY_CARE_PROVIDER_SITE_OTHER): Payer: Medicare Other | Admitting: Cardiovascular Disease

## 2014-03-25 VITALS — BP 155/80 | HR 76 | Ht 64.0 in | Wt 133.5 lb

## 2014-03-25 DIAGNOSIS — R0789 Other chest pain: Secondary | ICD-10-CM

## 2014-03-25 DIAGNOSIS — R079 Chest pain, unspecified: Secondary | ICD-10-CM

## 2014-03-25 DIAGNOSIS — I10 Essential (primary) hypertension: Secondary | ICD-10-CM

## 2014-03-25 DIAGNOSIS — Z85038 Personal history of other malignant neoplasm of large intestine: Secondary | ICD-10-CM

## 2014-03-25 DIAGNOSIS — K219 Gastro-esophageal reflux disease without esophagitis: Secondary | ICD-10-CM

## 2014-03-25 NOTE — Progress Notes (Signed)
Patient ID: Joanne Gomez, female    DOB: 1931-02-19, 80 y.o.   MRN: [id number]  HPI Comments: Joanne Gomez is a pleasant 80 year old woman with history of colon cancer, laparoscopic right hemicolectomy July 2009, who presents for evaluation of chest discomfort/  She reports having a long history of GI upset. After she eats, she feels that she develops a gas pocket. She has radiating pain to her lower back, shoulders. This has been going on for years but slowly getting worse. In general symptoms present at rest. Her husband is able to massage her abdomen, back, and symptoms typically resolve. Typically symptoms were present in the late afternoon and early evening.  She has been seen by Dr. Vira Agar and prescribed Gaviscon. This was started recently and she feels that it may have helped. She takes this after meals. Often after she eats she has a sense of fullness, sometimes nausea  She denies having any significant symptoms with exertion. Otherwise is relatively active. No regular exercise program.  In terms of her family history; father had CVA and died at 12, mother had thyroid disease and Huntington's disease and died at 73 In terms of her social history, she denies any smoking, no alcohol. Lives at home  EKG shows normal sinus rhythm with rate 76 bpm with no significant ST or T-wave changes   Outpatient Encounter Prescriptions as of 03/25/2014  Medication Sig  . Alum Hydroxide-Mag Carbonate (GAVISCON PO) Take by mouth. Take two tablets daily.  . Biotin 2500 MCG CAPS Take 1 each by mouth.  . calcium carbonate 1250 MG capsule Take 1,250 mg by mouth 2 (two) times daily with a meal.    . denosumab (PROLIA) 60 MG/ML SOLN injection Inject 60 mg into the skin every 6 (six) months. Administer in upper arm, thigh, or abdomen  . esomeprazole (NEXIUM) 40 MG capsule Take 1 capsule (40 mg total) by mouth daily before breakfast.  . fluticasone (FLONASE) 50 MCG/ACT nasal spray Place 2 sprays into both  nostrils daily.  . Multiple Vitamin (MULTIVITAMIN) tablet Take 1 tablet by mouth daily.    Marland Kitchen PRESCRIPTION MEDICATION HRT (36ml) 0.02% (0.2mg /ml Cream - Applicator full three times per week.  ([place] mixes it for pt) #12 tubes  . Probiotic Product (PROBIOTIC FORMULA PO) Take 1 each by mouth.      Review of Systems  Constitutional: Negative.   HENT: Negative.   Eyes: Negative.   Respiratory: Negative.   Cardiovascular: Negative.   Gastrointestinal: Negative.   Endocrine: Negative.   Musculoskeletal: Negative.   Skin: Negative.   Allergic/Immunologic: Negative.   Neurological: Negative.   Hematological: Negative.   Psychiatric/Behavioral: Negative.   All other systems reviewed and are negative.   BP 155/80  Pulse 76  Ht 5\' 4"  (1.626 m)  Wt 133 lb 8 oz (60.555 kg)  BMI 22.90 kg/m2  Physical Exam  Nursing note and vitals reviewed. Constitutional: She is oriented to person, place, and time. She appears well-developed and well-nourished.  HENT:  Head: Normocephalic.  Nose: Nose normal.  Mouth/Throat: Oropharynx is clear and moist.  Eyes: Conjunctivae are normal. Pupils are equal, round, and reactive to light.  Neck: Normal range of motion. Neck supple. No JVD present.  Cardiovascular: Normal rate, regular rhythm, S1 normal, S2 normal, normal heart sounds and intact distal pulses.  Exam reveals no gallop and no friction rub.   No murmur heard. Pulmonary/Chest: Effort normal and breath sounds normal. No respiratory distress. She has no wheezes. She  has no rales. She exhibits no tenderness.  Abdominal: Soft. Bowel sounds are normal. She exhibits no distension. There is no tenderness.  Musculoskeletal: Normal range of motion. She exhibits no edema and no tenderness.  Lymphadenopathy:    She has no cervical adenopathy.  Neurological: She is alert and oriented to person, place, and time. Coordination normal.  Skin: Skin is warm and dry. No rash noted. No erythema.   Psychiatric: She has a normal mood and affect. Her behavior is normal. Judgment and thought content normal.    Assessment and Plan

## 2014-03-25 NOTE — Patient Instructions (Addendum)
Your next appointment will be scheduled in our new office located at :  [facility]  [address] [place], [place] [address]   Ask Dr. Tiffany Kocher about gastroparesis  You are doing well. No medication changes were made.  Call the office if your symptoms get worse  Please call us if you have new issues that need to be addressed before your next appt.

## 2014-03-26 DIAGNOSIS — R079 Chest pain, unspecified: Secondary | ICD-10-CM | POA: Insufficient documentation

## 2014-03-26 DIAGNOSIS — I10 Essential (primary) hypertension: Secondary | ICD-10-CM | POA: Insufficient documentation

## 2014-03-26 DIAGNOSIS — K219 Gastro-esophageal reflux disease without esophagitis: Secondary | ICD-10-CM | POA: Insufficient documentation

## 2014-03-26 NOTE — Assessment & Plan Note (Signed)
Follow-up blood pressure had improved. She reports that she was anxious. Blood pressure at home typically well controlled. No changes made to her medications

## 2014-03-26 NOTE — Assessment & Plan Note (Signed)
Recommended that she continue on her current medications. Followed by Dr. Vira Agar

## 2014-03-26 NOTE — Assessment & Plan Note (Signed)
History of right hemicolectomy. Stable

## 2014-03-26 NOTE — Assessment & Plan Note (Signed)
Atypical symptoms, likely secondary to GI origin. Unable to exclude hiatal hernia, gastroparesis.  No cardiac studies ordered at this time. If symptoms get worse or present with exertion, stress test could be offered.

## 2014-03-30 ENCOUNTER — Telehealth: Payer: Self-pay

## 2014-03-30 NOTE — Telephone Encounter (Signed)
Faxed cardiac clearance for pt to proceed w/ colonoscopy & EGD.   Pt has been cleared for procedure. Faxed to [facility] at [phone]

## 2014-04-13 ENCOUNTER — Ambulatory Visit (INDEPENDENT_AMBULATORY_CARE_PROVIDER_SITE_OTHER): Payer: Medicare Other | Admitting: Family Medicine

## 2014-04-13 DIAGNOSIS — M810 Age-related osteoporosis without current pathological fracture: Secondary | ICD-10-CM

## 2014-04-13 MED ORDER — DENOSUMAB 60 MG/ML ~~LOC~~ SOLN
60.0000 mg | Freq: Once | SUBCUTANEOUS | Status: AC
  Administered 2014-04-13: 60 mg via SUBCUTANEOUS

## 2014-04-14 ENCOUNTER — Ambulatory Visit: Payer: Medicare Other | Admitting: Cardiology

## 2014-04-16 ENCOUNTER — Telehealth: Payer: Self-pay | Admitting: Family Medicine

## 2014-04-16 NOTE — Telephone Encounter (Signed)
Patient is calling to talk with you regarding a sore throat she is having  [phone]

## 2014-04-16 NOTE — Telephone Encounter (Signed)
Pt wanted to know what she should be taking for sinus drainage and ST. Informed pt to take an otc antihistamine and continue her current otc medications that include Flonase and tussin dm. Pt is not running a fever. Instructed pt that if symptoms get worse or she has a fever above 100.4 that she will need to be seen. Pt verbalizes understanding.

## 2014-04-26 ENCOUNTER — Ambulatory Visit: Payer: Self-pay | Admitting: Unknown Physician Specialty

## 2014-05-11 ENCOUNTER — Encounter: Payer: Self-pay | Admitting: Family Medicine

## 2014-05-17 ENCOUNTER — Encounter: Payer: Self-pay | Admitting: Family Medicine

## 2014-05-31 ENCOUNTER — Ambulatory Visit
Admission: RE | Admit: 2014-05-31 | Discharge: 2014-05-31 | Disposition: A | Discharge status: Home / Self Care | Payer: Medicare Other | Source: Ambulatory Visit | Attending: Family Medicine | Admitting: Family Medicine

## 2014-05-31 ENCOUNTER — Encounter: Payer: Self-pay | Admitting: Family Medicine

## 2014-05-31 ENCOUNTER — Ambulatory Visit (INDEPENDENT_AMBULATORY_CARE_PROVIDER_SITE_OTHER): Payer: No Typology Code available for payment source | Admitting: Family Medicine

## 2014-05-31 VITALS — BP 130/84 | HR 86 | Temp 97.6°F | Resp 16 | Ht 62.5 in | Wt 129.0 lb

## 2014-05-31 DIAGNOSIS — R0789 Other chest pain: Secondary | ICD-10-CM

## 2014-05-31 DIAGNOSIS — Z0000 Encounter for general adult medical examination without abnormal findings: Secondary | ICD-10-CM

## 2014-05-31 DIAGNOSIS — Z23 Encounter for immunization: Secondary | ICD-10-CM

## 2014-05-31 LAB — CBC WITH DIFFERENTIAL/PLATELET
BASOS PCT: 0 % (ref 0–1)
Basophils Absolute: 0 10*3/uL (ref 0.0–0.1)
EOS ABS: 0.2 10*3/uL (ref 0.0–0.7)
Eosinophils Relative: 2 % (ref 0–5)
HCT: 41.7 % (ref 36.0–46.0)
Hemoglobin: 14 g/dL (ref 12.0–15.0)
Lymphocytes Relative: 31 % (ref 12–46)
Lymphs Abs: 2.4 10*3/uL (ref 0.7–4.0)
MCH: 29.4 pg (ref 26.0–34.0)
MCHC: 33.6 g/dL (ref 30.0–36.0)
MCV: 87.6 fL (ref 78.0–100.0)
MPV: 10.2 fL (ref 8.6–12.4)
Monocytes Absolute: 0.6 10*3/uL (ref 0.1–1.0)
Monocytes Relative: 7 % (ref 3–12)
Neutro Abs: 4.7 10*3/uL (ref 1.7–7.7)
Neutrophils Relative %: 60 % (ref 43–77)
PLATELETS: 266 10*3/uL (ref 150–400)
RBC: 4.76 MIL/uL (ref 3.87–5.11)
RDW: 13.9 % (ref 11.5–15.5)
WBC: 7.9 10*3/uL (ref 4.0–10.5)

## 2014-05-31 LAB — COMPLETE METABOLIC PANEL WITH GFR
ALK PHOS: 59 U/L (ref 39–117)
ALT: 14 U/L (ref 0–35)
AST: 20 U/L (ref 0–37)
Albumin: 4.4 g/dL (ref 3.5–5.2)
BUN: 9 mg/dL (ref 6–23)
CO2: 26 mEq/L (ref 19–32)
CREATININE: 0.7 mg/dL (ref 0.50–1.10)
Calcium: 9.6 mg/dL (ref 8.4–10.5)
Chloride: 103 mEq/L (ref 96–112)
GFR, Est African American: >89 mL/min
GFR, Est Non African American: 82 mL/min
Glucose, Bld: 96 mg/dL (ref 70–99)
Potassium: 4.6 mEq/L (ref 3.5–5.3)
Sodium: 143 mEq/L (ref 135–145)
Total Bilirubin: 0.5 mg/dL (ref 0.2–1.2)
Total Protein: 7.6 g/dL (ref 6.0–8.3)

## 2014-05-31 LAB — LIPID PANEL
CHOL/HDL RATIO: 3.2 ratio
Cholesterol: 246 mg/dL — ABNORMAL HIGH (ref 0–200)
HDL: 77 mg/dL (ref >39)
LDL CALC: 150 mg/dL — AB (ref 0–99)
Triglycerides: 96 mg/dL (ref <150)
VLDL: 19 mg/dL (ref 0–40)

## 2014-05-31 LAB — TSH: TSH: 5.547 u[IU]/mL — ABNORMAL HIGH (ref 0.350–4.500)

## 2014-05-31 NOTE — Progress Notes (Signed)
Subjective:    Patient ID: Joanne Gomez, female    DOB: 1930/07/07, 81 y.o.   MRN: [id number]  HPI  Patient is here today for complete physical exam. Patient had a colonoscopy performed this year which was significant for a polyp. No regular surveillance was scheduled due to age. She is due for a mammogram as well as a bone density test in September. Mammogram performed last July was normal. She does have osteoporosis for which she has been on Prolia 3 years he is due for repeat bone density in September. Patient's immunizations are all up-to-date. She is due for fasting lab work. She continues to complain of an acid-like discomfort in the center of her chest. However endoscopies have been completely normal. She is seen a cardiologist who felt that this was not cardiac in nature. She also complains of pain in her neck and muscle spasms in her neck and in her mid back which sounds to be arthritic Past Medical History  Diagnosis Date  . Cancer 11/2007    colon stage II  . H/O colon cancer, stage II 11/15/2011  . Hypothyroidism   . Hyperlipidemia   . Osteoporosis    Past Surgical History  Procedure Laterality Date  . Cholecystectomy    . Hemocolectomy  2009  . Abdominal hysterectomy    . Appendectomy    . Elbow surgery      right   . Vaginal prolapse repair     Current Outpatient Prescriptions on File Prior to Visit  Medication Sig Dispense Refill  . Alum Hydroxide-Mag Carbonate (GAVISCON PO) Take by mouth. Take two tablets daily.    . Biotin 2500 MCG CAPS Take 1 each by mouth.    . calcium carbonate 1250 MG capsule Take 1,250 mg by mouth 2 (two) times daily with a meal.      . denosumab (PROLIA) 60 MG/ML SOLN injection Inject 60 mg into the skin every 6 (six) months. Administer in upper arm, thigh, or abdomen    . esomeprazole (NEXIUM) 40 MG capsule Take 1 capsule (40 mg total) by mouth daily before breakfast. 90 capsule 3  . fluticasone (FLONASE) 50 MCG/ACT nasal spray Place 2  sprays into both nostrils daily. 16 g 6  . Multiple Vitamin (MULTIVITAMIN) tablet Take 1 tablet by mouth daily.      Marland Kitchen PRESCRIPTION MEDICATION HRT (90ml) 0.02% (0.2mg /ml Cream - Applicator full three times per week.  ([place] mixes it for pt) #12 tubes    . Probiotic Product (PROBIOTIC FORMULA PO) Take 1 each by mouth.       No current facility-administered medications on file prior to visit.   No Known Allergies History   Social History  . Marital Status: Married    Spouse Name: N/A    Number of Children: N/A  . Years of Education: N/A   Occupational History  . Not on file.   Social History Main Topics  . Smoking status: Never Smoker   . Smokeless tobacco: Never Used  . Alcohol Use: No  . Drug Use: No  . Sexual Activity: Yes     Comment: Married to [place], retired.   Other Topics Concern  . Not on file   Social History Narrative   Family History  Problem Relation Age of Onset  . Heart attack Sister 68  . Hyperlipidemia Sister   . Hypertension Sister      Review of Systems  All other systems reviewed and are negative.  Objective:   Physical Exam  Constitutional: She is oriented to person, place, and time. She appears well-developed and well-nourished. No distress.  HENT:  Head: Normocephalic and atraumatic.  Right Ear: External ear normal.  Left Ear: External ear normal.  Nose: Nose normal.  Mouth/Throat: Oropharynx is clear and moist. No oropharyngeal exudate.  Eyes: Conjunctivae and EOM are normal. Pupils are equal, round, and reactive to light. Left eye exhibits no discharge. No scleral icterus.  Neck: Normal range of motion. Neck supple. No JVD present. No tracheal deviation present. No thyromegaly present.  Cardiovascular: Normal rate, regular rhythm, normal heart sounds and intact distal pulses.  Exam reveals no gallop and no friction rub.   No murmur heard. Pulmonary/Chest: Effort normal and breath sounds normal. No stridor. No  respiratory distress. She has no wheezes. She has no rales. She exhibits no tenderness.  Abdominal: Soft. Bowel sounds are normal. She exhibits no distension and no mass. There is no tenderness. There is no rebound and no guarding.  Musculoskeletal: Normal range of motion. She exhibits no edema or tenderness.  Lymphadenopathy:    She has no cervical adenopathy.  Neurological: She is alert and oriented to person, place, and time. She has normal reflexes. She displays normal reflexes. No cranial nerve deficit. She exhibits normal muscle tone. Coordination normal.  Skin: Skin is warm. No rash noted. No erythema. No pallor.  Psychiatric: She has a normal mood and affect. Her behavior is normal. Judgment and thought content normal.  Vitals reviewed.         Assessment & Plan:  Routine general medical examination at a health care facility - Plan: COMPLETE METABOLIC PANEL WITH GFR, CBC with Differential, Lipid panel, TSH  Other chest pain - Plan: DG Chest 2 View  Patient's physical exam is completely normal. I will check a CBC, CMP, fasting lipid panel, and TSH. Patient's immunizations are up-to-date. Her cancer screening is up-to-date. I recommended a mammogram and bone density test in September I will obtain a chest x-ray to further workup the patient's chest pain given the factors mentioned negative GI workup to date. If the chest x-ray is normal, we can try treating the patient for possible esophageal spasms with a calcium channel blocker. However I believe the patient's symptoms are most likely arthritic in her neck and in her thoracic spine. I would consider starting the patient on Celebrex 200 mg by mouth daily I will await the results of the chest x-ray.

## 2014-05-31 NOTE — Addendum Note (Signed)
Addended by: Shary Decamp B on: 05/31/2014 11:17 AM   Modules accepted: Orders

## 2014-06-04 ENCOUNTER — Telehealth: Payer: Self-pay | Admitting: Family Medicine

## 2014-06-04 DIAGNOSIS — E039 Hypothyroidism, unspecified: Secondary | ICD-10-CM

## 2014-06-04 MED ORDER — MELOXICAM 15 MG PO TABS: 15.0000 mg | ORAL_TABLET | Freq: Every day | ORAL | Status: DC

## 2014-06-04 MED ORDER — FLUTICASONE PROPIONATE 50 MCG/ACT NA SUSP: 2.0000 | Freq: Every day | NASAL | Status: DC

## 2014-06-04 NOTE — Telephone Encounter (Signed)
Pt is on thyroid med.  Takes Armour thyroid 60 mg a day.  I added this to her med list.  Please advise on med adjustment.   Aware of other results.  Mobic sent to pharmacy and also req RF Flonase.  Told to follow up in one month.

## 2014-06-04 NOTE — Telephone Encounter (Signed)
-----   Message from Susy Frizzle, MD sent at 06/01/2014  7:38 AM EST ----- Cholesterol is much worse with LDL of 150.  Goal is less than 130.  I would suggest pravastatin 20 mg poqday.  TSH is elevated.  Her med list does not show thyroid med but I am almost 100% sure she is on one.  Please verify that she is and obtain the dose because she needs a little bit more.  Good news, CXR shows no mass/cancer.  However, it shows arthritis in her thoracic spine and we already knew she had arthritis in her neck.  This may be causing some of her problems particularly the pain in her neck and muscle spasms in her mid back.  I would try mobic 15 mg poqday and recheck here in OV in 1 motnh to see if her symptoms are better.

## 2014-06-07 NOTE — Telephone Encounter (Signed)
Increase armour to 75 mg poqday.  She will have to take a 60 mg tab and 1/2 of a 30 mg tab per day.  Recheck TSH in 2 months.

## 2014-06-08 MED ORDER — THYROID 60 MG PO TABS: 60.0000 mg | ORAL_TABLET | Freq: Every day | ORAL | Status: DC

## 2014-06-08 MED ORDER — THYROID 15 MG PO TABS: 15.0000 mg | ORAL_TABLET | Freq: Every day | ORAL | Status: DC

## 2014-06-08 NOTE — Telephone Encounter (Signed)
New Rx to pharmacy .  Left pt message to call me back.

## 2014-06-08 NOTE — Telephone Encounter (Signed)
Pt declines Pravastatin, said she discussed this with you.  Call in one month with how Mobic is working.  Repeat TSH in 2 months.  Rx for new thyroid to pharmacy  Armour 60 mg + 15 mg daily to equal 75 mg /day

## 2014-06-08 NOTE — Telephone Encounter (Signed)
PT has rtn your call [phone]

## 2014-07-01 ENCOUNTER — Telehealth: Payer: Self-pay | Admitting: *Deleted

## 2014-07-01 ENCOUNTER — Other Ambulatory Visit: Payer: Self-pay | Admitting: Family Medicine

## 2014-07-01 NOTE — Telephone Encounter (Signed)
Received request from pharmacy for PA on Armour thyroid.  PA submitted.   Dx: Hypothyrodism (E03.9)

## 2014-07-01 NOTE — Telephone Encounter (Signed)
Refill appropriate and filled per protocol. 

## 2014-07-05 NOTE — Telephone Encounter (Signed)
Approved Through 05/28/2015 - Faxed to pharm

## 2014-07-09 ENCOUNTER — Telehealth: Payer: Self-pay | Admitting: Family Medicine

## 2014-07-09 NOTE — Telephone Encounter (Signed)
Great, just monitor for stomach discomfort on NSAID.

## 2014-07-09 NOTE — Telephone Encounter (Signed)
Spoke to pt and she states that the Mobic is helping her. She is about 50-60% better.

## 2014-07-09 NOTE — Telephone Encounter (Signed)
Patient aware.

## 2014-07-09 NOTE — Telephone Encounter (Signed)
Patient is calling to discuss her meloxicam with you, per patient dr pickard wanted to see if it was working well    [phone]

## 2014-08-03 ENCOUNTER — Other Ambulatory Visit: Payer: No Typology Code available for payment source

## 2014-08-03 DIAGNOSIS — E039 Hypothyroidism, unspecified: Secondary | ICD-10-CM

## 2014-08-03 LAB — TSH: TSH: 0.647 u[IU]/mL (ref 0.350–4.500)

## 2014-08-06 ENCOUNTER — Encounter: Payer: Self-pay | Admitting: *Deleted

## 2014-08-10 ENCOUNTER — Encounter: Payer: Self-pay | Admitting: Family Medicine

## 2014-08-10 ENCOUNTER — Ambulatory Visit: Payer: No Typology Code available for payment source | Admitting: Family Medicine

## 2014-08-10 ENCOUNTER — Ambulatory Visit (INDEPENDENT_AMBULATORY_CARE_PROVIDER_SITE_OTHER): Payer: No Typology Code available for payment source | Admitting: Family Medicine

## 2014-08-10 VITALS — BP 144/70 | HR 80 | Temp 97.6°F | Resp 16 | Ht 62.5 in | Wt 129.0 lb

## 2014-08-10 DIAGNOSIS — E038 Other specified hypothyroidism: Secondary | ICD-10-CM | POA: Diagnosis not present

## 2014-08-10 NOTE — Progress Notes (Signed)
Subjective:    Patient ID: Joanne Gomez, female    DOB: September 21, 1930, 81 y.o.   MRN: [id number]  HPI  Patient is here for follow-up on her hypothyroidism. I last saw the patient in January, her TSH was subtherapeutic at 5.5. At that time I increased her Armour Thyroid from 60 mg a day to 75 mg a day. Patient's most recent TSH was therapeutic. Her primary concern is worry over her husband's apparent memory loss. Appointment on 08/03/2014  Component Date Value Ref Range Status  . TSH 08/03/2014 0.647  0.350 - 4.500 uIU/mL Final   Past Medical History  Diagnosis Date  . Cancer 11/2007    colon stage II  . H/O colon cancer, stage II 11/15/2011  . Hypothyroidism   . Hyperlipidemia   . Osteoporosis    Past Surgical History  Procedure Laterality Date  . Cholecystectomy    . Hemocolectomy  2009  . Abdominal hysterectomy    . Appendectomy    . Elbow surgery      right   . Vaginal prolapse repair     Current Outpatient Prescriptions on File Prior to Visit  Medication Sig Dispense Refill  . Alum Hydroxide-Mag Carbonate (GAVISCON PO) Take by mouth. Take two tablets daily.    Francia Greaves THYROID 15 MG tablet TAKE ONE TABLET EVERY DAY 30 tablet 11  . calcium carbonate 1250 MG capsule Take 1,250 mg by mouth 2 (two) times daily with a meal.      . denosumab (PROLIA) 60 MG/ML SOLN injection Inject 60 mg into the skin every 6 (six) months. Administer in upper arm, thigh, or abdomen    . esomeprazole (NEXIUM) 40 MG capsule Take 1 capsule (40 mg total) by mouth daily before breakfast. 90 capsule 3  . meloxicam (MOBIC) 15 MG tablet Take 1 tablet (15 mg total) by mouth daily. 30 tablet 2  . Multiple Vitamin (MULTIVITAMIN) tablet Take 1 tablet by mouth daily.      Marland Kitchen PRESCRIPTION MEDICATION HRT (31ml) 0.02% (0.2mg /ml Cream - Applicator full two times per week.  ([place] mixes it for pt) #12 tubes    . Probiotic Product (PROBIOTIC FORMULA PO) Take 1 each by mouth.      . thyroid (ARMOUR) 60  MG tablet Take 1 tablet (60 mg total) by mouth daily before breakfast. 30 tablet 1   No current facility-administered medications on file prior to visit.   No Known Allergies History   Social History  . Marital Status: Married    Spouse Name: N/A  . Number of Children: N/A  . Years of Education: N/A   Occupational History  . Not on file.   Social History Main Topics  . Smoking status: Never Smoker   . Smokeless tobacco: Never Used  . Alcohol Use: No  . Drug Use: No  . Sexual Activity: Yes     Comment: Married to [place], retired.   Other Topics Concern  . Not on file   Social History Narrative     Review of Systems  All other systems reviewed and are negative.      Objective:   Physical Exam  Constitutional: She appears well-developed and well-nourished.  Cardiovascular: Normal rate, regular rhythm and normal heart sounds.   No murmur heard. Pulmonary/Chest: Effort normal and breath sounds normal. No respiratory distress. She has no wheezes. She has no rales.  Abdominal: Soft. Bowel sounds are normal. She exhibits no distension. There is no tenderness. There is no  rebound and no guarding.  Musculoskeletal: She exhibits no edema.  Vitals reviewed.         Assessment & Plan:  Other specified hypothyroidism  Patient's thyroid medication is now appropriately dosed. Recheck thyroid tests in 6 months. I did ask patient to start checking her blood pressure at home and bring that with her when she comes with her husband in one month for me to review given to the fact her blood pressure today is borderline elevated

## 2014-09-17 ENCOUNTER — Other Ambulatory Visit: Payer: Self-pay | Admitting: Family Medicine

## 2014-09-17 DIAGNOSIS — E039 Hypothyroidism, unspecified: Secondary | ICD-10-CM

## 2014-09-17 MED ORDER — THYROID 60 MG PO TABS: 60.0000 mg | ORAL_TABLET | Freq: Every day | ORAL | Status: DC

## 2014-09-20 LAB — SURGICAL PATHOLOGY

## 2014-10-05 ENCOUNTER — Encounter: Payer: Self-pay | Admitting: Family Medicine

## 2014-10-05 ENCOUNTER — Ambulatory Visit (INDEPENDENT_AMBULATORY_CARE_PROVIDER_SITE_OTHER): Payer: Medicare Other | Admitting: Family Medicine

## 2014-10-05 VITALS — BP 144/70 | HR 82 | Temp 98.4°F | Resp 16 | Ht 62.5 in | Wt 131.0 lb

## 2014-10-05 DIAGNOSIS — R319 Hematuria, unspecified: Secondary | ICD-10-CM

## 2014-10-05 DIAGNOSIS — L900 Lichen sclerosus et atrophicus: Secondary | ICD-10-CM

## 2014-10-05 LAB — URINALYSIS, ROUTINE W REFLEX MICROSCOPIC
BILIRUBIN URINE: NEGATIVE
Glucose, UA: NEGATIVE mg/dL
Ketones, ur: NEGATIVE mg/dL
Nitrite: NEGATIVE
Protein, ur: NEGATIVE mg/dL
SPECIFIC GRAVITY, URINE: 1.02 (ref 1.005–1.030)
UROBILINOGEN UA: 0.2 mg/dL (ref 0.0–1.0)
pH: 7 (ref 5.0–8.0)

## 2014-10-05 LAB — URINALYSIS, MICROSCOPIC ONLY
Casts: NONE SEEN
Crystals: NONE SEEN

## 2014-10-05 MED ORDER — DESONIDE 0.05 % EX CREA: TOPICAL_CREAM | Freq: Two times a day (BID) | CUTANEOUS | Status: DC

## 2014-10-05 NOTE — Progress Notes (Signed)
Subjective:    Patient ID: Joanne Gomez, female    DOB: 05-23-1931, 81 y.o.   MRN: [id number]  HPI This morning, the patient went to the bathroom and urinated. When she wipes after urination she found trace amounts of blood on the toilet tissue. She's had no further episodes of hematuria. On urinalysis today there is trace amounts of blood but there is no other sign of infection. Pelvic exam is performed. The skin on the labia majora is erythematous red and irritated with lichenification. Vaginal mucosa is pink and healthy there are no pathologic lesion seen at the introitus. Speculum was used to visualize the vaginal canal and there are no abnormal lesion seen within the vaginal canal. There is mild irritation around the urethral orifice at approximately 4:00 on the urethra orifice. This may be the source of the bleeding. Area of irritation is approximately 2 mm. The mucosa in that area is slightly more erythematous than the remainder of the mucosa around orifice Past Medical History  Diagnosis Date  . Cancer 11/2007    colon stage II  . H/O colon cancer, stage II 11/15/2011  . Hypothyroidism   . Hyperlipidemia   . Osteoporosis    Past Surgical History  Procedure Laterality Date  . Cholecystectomy    . Hemocolectomy  2009  . Abdominal hysterectomy    . Appendectomy    . Elbow surgery      right   . Vaginal prolapse repair     Current Outpatient Prescriptions on File Prior to Visit  Medication Sig Dispense Refill  . Alum Hydroxide-Mag Carbonate (GAVISCON PO) Take by mouth. Take two tablets daily.    . calcium carbonate 1250 MG capsule Take 1,250 mg by mouth 2 (two) times daily with a meal.      . denosumab (PROLIA) 60 MG/ML SOLN injection Inject 60 mg into the skin every 6 (six) months. Administer in upper arm, thigh, or abdomen    . esomeprazole (NEXIUM) 40 MG capsule Take 1 capsule (40 mg total) by mouth daily before breakfast. 90 capsule 3  . meloxicam (MOBIC) 15 MG tablet Take 1  tablet (15 mg total) by mouth daily. 30 tablet 2  . Multiple Vitamin (MULTIVITAMIN) tablet Take 1 tablet by mouth daily.      Marland Kitchen PRESCRIPTION MEDICATION HRT (31ml) 0.02% (0.2mg /ml Cream - Applicator full two times per week.  ([employer] mixes it for pt) #12 tubes    . Probiotic Product (PROBIOTIC FORMULA PO) Take 1 each by mouth.      . thyroid (ARMOUR) 60 MG tablet Take 1 tablet (60 mg total) by mouth daily before breakfast. 90 tablet 1   No current facility-administered medications on file prior to visit.   No Known Allergies History   Social History  . Marital Status: Married    Spouse Name: N/A  . Number of Children: N/A  . Years of Education: N/A   Occupational History  . Not on file.   Social History Main Topics  . Smoking status: Never Smoker   . Smokeless tobacco: Never Used  . Alcohol Use: No  . Drug Use: No  . Sexual Activity: Yes     Comment: Married to [place], retired.   Other Topics Concern  . Not on file   Social History Narrative      Review of Systems  All other systems reviewed and are negative.      Objective:   Physical Exam  Cardiovascular: Normal rate, regular  rhythm and normal heart sounds.   Pulmonary/Chest: Effort normal and breath sounds normal. No respiratory distress. She has no wheezes. She has no rales. She exhibits no tenderness.  Abdominal: Soft. Bowel sounds are normal.  Genitourinary: Vagina normal. There is rash on the right labia. There is no lesion or injury on the right labia. There is rash on the left labia. There is no lesion or injury on the left labia. No erythema or tenderness in the vagina. No signs of injury around the vagina. No vaginal discharge found.  Skin: Rash noted. There is erythema.  Vitals reviewed.         Assessment & Plan:  Blood in urine - Plan: Urinalysis, Routine w reflex microscopic  Lichen sclerosus et atrophicus - Plan: desonide (DESOWEN) 0.05 % cream  I believe the source of the blood  that was on the toilet tissue after urination was likely the irritation around the urethral orifice. I recommended that we monitor this for the next week or so. This area of irritation should heal spontaneously in that time period. If she continues to have hematuria after that, I would recommend a CT scan of the kidneys to evaluate for pathologic lesions on the kidneys, and a referral to urology for possible cystoscopy but I believe we have the source of the bleeding discovered on pelvic exam. There are no mucosal lesions that would possibly cause vaginal bleeding. She had a Pap smear and pelvic exam performed prior gynecologist earlier this year. She also had a pelvic ultrasound that showed no lesions on the kidneys are uterus. She does appear to have lichen sclerosis et atrophicus.  She can use DesOwen cream twice a day to treat the itching. Could also consider topical creams for yeast if the erythema worsens. Patient is comfortable with this plan and will contact me if symptoms persist

## 2014-10-15 ENCOUNTER — Ambulatory Visit (INDEPENDENT_AMBULATORY_CARE_PROVIDER_SITE_OTHER): Payer: Medicare Other | Admitting: Family Medicine

## 2014-10-15 DIAGNOSIS — M810 Age-related osteoporosis without current pathological fracture: Secondary | ICD-10-CM | POA: Diagnosis not present

## 2014-10-15 MED ORDER — DENOSUMAB 60 MG/ML ~~LOC~~ SOLN
60.0000 mg | Freq: Once | SUBCUTANEOUS | Status: AC
  Administered 2014-10-15: 60 mg via SUBCUTANEOUS

## 2014-12-01 ENCOUNTER — Telehealth: Payer: Self-pay | Admitting: Family Medicine

## 2014-12-01 NOTE — Telephone Encounter (Signed)
[phone] Pt is wanting to speak to you in regards to getting an appt for mammogram and bone density set up

## 2014-12-07 ENCOUNTER — Other Ambulatory Visit: Payer: Self-pay | Admitting: Family Medicine

## 2014-12-07 DIAGNOSIS — Z1231 Encounter for screening mammogram for malignant neoplasm of breast: Secondary | ICD-10-CM

## 2014-12-07 DIAGNOSIS — M810 Age-related osteoporosis without current pathological fracture: Secondary | ICD-10-CM

## 2014-12-07 NOTE — Telephone Encounter (Signed)
Pt stating is needing an order to have mammogram and bone density done at [facility], I placed the order and advised pt to call and schedule appt.

## 2014-12-15 ENCOUNTER — Other Ambulatory Visit: Payer: Medicare Other

## 2015-01-17 ENCOUNTER — Ambulatory Visit (HOSPITAL_BASED_OUTPATIENT_CLINIC_OR_DEPARTMENT_OTHER): Payer: Medicare Other | Admitting: Oncology

## 2015-01-17 ENCOUNTER — Other Ambulatory Visit (HOSPITAL_BASED_OUTPATIENT_CLINIC_OR_DEPARTMENT_OTHER): Payer: Medicare Other

## 2015-01-17 ENCOUNTER — Telehealth: Payer: Self-pay | Admitting: Oncology

## 2015-01-17 VITALS — BP 155/67 | HR 70 | Temp 97.9°F | Resp 18 | Ht 62.5 in | Wt 130.7 lb

## 2015-01-17 DIAGNOSIS — Z85038 Personal history of other malignant neoplasm of large intestine: Secondary | ICD-10-CM

## 2015-01-17 LAB — CEA: CEA: 1.5 ng/mL (ref 0.0–5.0)

## 2015-01-17 NOTE — Progress Notes (Signed)
  [facility] OFFICE PROGRESS NOTE   Diagnosis: Colon cancer  INTERVAL HISTORY:   Ms. [name] returns as scheduled. She underwent a screening colonoscopy 04/26/2014. Polyps were removed from the transverse colon and returned as tubular adenomas. No high-grade dysplasia or malignancy.  She feels well. Good appetite. No difficulty with bowel function. No complaint.  Objective:  Vital signs in last 24 hours:  Blood pressure 155/67, pulse 70, temperature 97.9 F (36.6 C), temperature source Oral, resp. rate 18, height 5' 2.5" (1.588 m), weight 130 lb 11.2 oz (59.285 kg), SpO2 99 %.    HEENT: Neck without mass Lymphatics: No cervical, supraclavicular, axillary, or inguinal nodes Resp: Lungs clear bilaterally Cardio: Regular rate and rhythm GI: No hepatomegaly, no splenomegaly, nontender, no mass Vascular: No leg edema   Lab Results:    Lab Results  Component Value Date   CEA 1.9 01/18/2014    Medications: I have reviewed the patient's current medications.  Assessment/Plan: 1. Colon cancer, acsending colon, status post a laparoscopic assisted right hemicolectomy 12/24/2007, stage II (T3 N0)    Disposition:  Joanne Gomez remains in clinical remission from colon cancer. She would like to continue follow-up at the [facility]. She will return for an office visit and CEA in one year.  Betsy Coder, MD  01/17/2015  10:21 AM

## 2015-01-17 NOTE — Telephone Encounter (Signed)
Gave and printed appt sched and avs for pt for Aug 2017 °

## 2015-01-19 ENCOUNTER — Telehealth: Payer: Self-pay | Admitting: *Deleted

## 2015-01-19 NOTE — Telephone Encounter (Signed)
-----   Message from Ladell Pier, MD sent at 01/17/2015  6:40 PM EDT ----- Please call patient, cea is normal

## 2015-01-19 NOTE — Telephone Encounter (Signed)
Per Dr. Sherrill; notified pt that cea is normal.  Pt verbalized understanding and expressed appreciation for call. 

## 2015-01-21 ENCOUNTER — Telehealth: Payer: Self-pay | Admitting: *Deleted

## 2015-01-21 NOTE — Telephone Encounter (Signed)
Pt has purchased Claritin D for her allergies and she had read on the package that if she has thyroid disease to not take, pt wants to know if she can take claritan and if not what can she take.   Please advise!  [phone]

## 2015-01-21 NOTE — Telephone Encounter (Signed)
She can take it.  They are worried about sudafed.  She can take it.

## 2015-01-24 NOTE — Telephone Encounter (Signed)
Called pt and is aware of message

## 2015-02-16 ENCOUNTER — Other Ambulatory Visit: Payer: Self-pay | Admitting: Family Medicine

## 2015-02-16 ENCOUNTER — Ambulatory Visit
Admission: RE | Admit: 2015-02-16 | Discharge: 2015-02-16 | Disposition: A | Discharge status: Home / Self Care | Payer: Medicare Other | Source: Ambulatory Visit | Attending: Family Medicine | Admitting: Family Medicine

## 2015-02-16 DIAGNOSIS — Z1231 Encounter for screening mammogram for malignant neoplasm of breast: Secondary | ICD-10-CM

## 2015-02-16 DIAGNOSIS — M810 Age-related osteoporosis without current pathological fracture: Secondary | ICD-10-CM | POA: Insufficient documentation

## 2015-02-22 ENCOUNTER — Encounter: Payer: Self-pay | Admitting: Family Medicine

## 2015-03-17 ENCOUNTER — Encounter: Payer: Self-pay | Admitting: Family Medicine

## 2015-03-17 ENCOUNTER — Ambulatory Visit (INDEPENDENT_AMBULATORY_CARE_PROVIDER_SITE_OTHER): Payer: Medicare Other | Admitting: Family Medicine

## 2015-03-17 VITALS — BP 144/88 | HR 98 | Temp 98.3°F | Resp 16 | Ht 62.5 in | Wt 128.0 lb

## 2015-03-17 DIAGNOSIS — E039 Hypothyroidism, unspecified: Secondary | ICD-10-CM | POA: Diagnosis not present

## 2015-03-17 DIAGNOSIS — E785 Hyperlipidemia, unspecified: Secondary | ICD-10-CM | POA: Diagnosis not present

## 2015-03-17 DIAGNOSIS — Z23 Encounter for immunization: Secondary | ICD-10-CM | POA: Diagnosis not present

## 2015-03-17 NOTE — Progress Notes (Signed)
Subjective:    Patient ID: Joanne Gomez, female    DOB: 1930-09-22, 81 y.o.   MRN: [id number]  HPI  08/10/14 Patient is here for follow-up on her hypothyroidism. I last saw the patient in January, her TSH was subtherapeutic at 5.5. At that time I increased her Armour Thyroid from 60 mg a day to 75 mg a day. Patient's most recent TSH was therapeutic. Her primary concern is worry over her husband's apparent memory loss.  AT that tie, my plan was: Patient's thyroid medication is now appropriately dosed. Recheck thyroid tests in 6 months. I did ask patient to start checking her blood pressure at home and bring that with her when she comes with her husband in one month for me to review given to the fact her blood pressure today is borderline elevated.  03/17/15 Patient is here today for follow-up. Her blood pressure remains borderline but given her age it is not severe enough to one therapy at this time. She has been able to tolerate her last injection of per [facility] and therefore given her osteoporosis I have recommended that she continue this. She is due to recheck her thyroid test. Otherwise she is doing well with no concerns.  Past Medical History  Diagnosis Date  . H/O colon cancer, stage II 11/15/2011  . Hypothyroidism   . Hyperlipidemia   . Osteoporosis   . Cancer 11/2007    colon stage II   Past Surgical History  Procedure Laterality Date  . Cholecystectomy    . Hemocolectomy  2009  . Abdominal hysterectomy    . Appendectomy    . Elbow surgery      right   . Vaginal prolapse repair     Current Outpatient Prescriptions on File Prior to Visit  Medication Sig Dispense Refill  . denosumab (PROLIA) 60 MG/ML SOLN injection Inject 60 mg into the skin every 6 (six) months. Administer in upper arm, thigh, or abdomen    . esomeprazole (NEXIUM) 40 MG capsule Take 1 capsule (40 mg total) by mouth daily before breakfast. 90 capsule 3  . Multiple Vitamin (MULTIVITAMIN) tablet Take 1 tablet by  mouth daily.      Marland Kitchen PRESCRIPTION MEDICATION HRT (41ml) 0.02% (0.2mg /ml Cream - Applicator full two times per week.  ([employer] mixes it for pt) #12 tubes    . thyroid (ARMOUR) 60 MG tablet Take 1 tablet (60 mg total) by mouth daily before breakfast. 90 tablet 1   No current facility-administered medications on file prior to visit.   No Known Allergies Social History   Social History  . Marital Status: Married    Spouse Name: N/A  . Number of Children: N/A  . Years of Education: N/A   Occupational History  . Not on file.   Social History Main Topics  . Smoking status: Never Smoker   . Smokeless tobacco: Never Used  . Alcohol Use: No  . Drug Use: No  . Sexual Activity: Yes     Comment: Married to [place], retired.   Other Topics Concern  . Not on file   Social History Narrative     Review of Systems  All other systems reviewed and are negative.      Objective:   Physical Exam  Constitutional: She appears well-developed and well-nourished.  Cardiovascular: Normal rate, regular rhythm and normal heart sounds.   No murmur heard. Pulmonary/Chest: Effort normal and breath sounds normal. No respiratory distress. She has no wheezes. She has  no rales.  Abdominal: Soft. Bowel sounds are normal. She exhibits no distension. There is no tenderness. There is no rebound and no guarding.  Musculoskeletal: She exhibits no edema.  Vitals reviewed.         Assessment & Plan:  HLD (hyperlipidemia) - Plan: CBC with Differential/Platelet, COMPLETE METABOLIC PANEL WITH GFR, Lipid panel  Hypothyroidism, unspecified hypothyroidism type - Plan: TSH  Need for prophylactic vaccination and inoculation against influenza - Plan: Flu Vaccine QUAD 36+ mos IM  Blood pressures acceptable. I would like to check a fasting lipid panel to monitor her cholesterol. She received her flu shot today. I would like the patient to return fasting so that I can check a fasting lipid panel along with  a TSH.

## 2015-03-29 ENCOUNTER — Other Ambulatory Visit: Payer: Medicare Other

## 2015-03-29 DIAGNOSIS — M810 Age-related osteoporosis without current pathological fracture: Secondary | ICD-10-CM

## 2015-03-29 DIAGNOSIS — I10 Essential (primary) hypertension: Secondary | ICD-10-CM

## 2015-03-29 DIAGNOSIS — Z79899 Other long term (current) drug therapy: Secondary | ICD-10-CM

## 2015-03-29 DIAGNOSIS — E0590 Thyrotoxicosis, unspecified without thyrotoxic crisis or storm: Secondary | ICD-10-CM

## 2015-03-29 LAB — CBC WITH DIFFERENTIAL/PLATELET
BASOS ABS: 0 10*3/uL (ref 0.0–0.1)
BASOS PCT: 0 % (ref 0–1)
EOS ABS: 0.1 10*3/uL (ref 0.0–0.7)
Eosinophils Relative: 2 % (ref 0–5)
HCT: 40.8 % (ref 36.0–46.0)
Hemoglobin: 13.7 g/dL (ref 12.0–15.0)
Lymphocytes Relative: 32 % (ref 12–46)
Lymphs Abs: 2.4 10*3/uL (ref 0.7–4.0)
MCH: 29.2 pg (ref 26.0–34.0)
MCHC: 33.6 g/dL (ref 30.0–36.0)
MCV: 87 fL (ref 78.0–100.0)
MPV: 10.1 fL (ref 8.6–12.4)
Monocytes Absolute: 0.5 10*3/uL (ref 0.1–1.0)
Monocytes Relative: 7 % (ref 3–12)
NEUTROS PCT: 59 % (ref 43–77)
Neutro Abs: 4.4 10*3/uL (ref 1.7–7.7)
PLATELETS: 249 10*3/uL (ref 150–400)
RBC: 4.69 MIL/uL (ref 3.87–5.11)
RDW: 14 % (ref 11.5–15.5)
WBC: 7.4 10*3/uL (ref 4.0–10.5)

## 2015-03-29 LAB — COMPLETE METABOLIC PANEL WITH GFR
ALBUMIN: 4.2 g/dL (ref 3.6–5.1)
ALT: 12 U/L (ref 6–29)
AST: 18 U/L (ref 10–35)
Alkaline Phosphatase: 62 U/L (ref 33–130)
BILIRUBIN TOTAL: 0.4 mg/dL (ref 0.2–1.2)
BUN: 9 mg/dL (ref 7–25)
CO2: 31 mmol/L (ref 20–31)
CREATININE: 0.66 mg/dL (ref 0.60–0.88)
Calcium: 9.8 mg/dL (ref 8.6–10.4)
Chloride: 104 mmol/L (ref 98–110)
GFR, Est African American: >89 mL/min (ref >=60)
GFR, Est Non African American: 83 mL/min (ref >=60)
GLUCOSE: 93 mg/dL (ref 70–99)
Potassium: 4.7 mmol/L (ref 3.5–5.3)
Sodium: 142 mmol/L (ref 135–146)
TOTAL PROTEIN: 7.4 g/dL (ref 6.1–8.1)

## 2015-03-29 LAB — LIPID PANEL
Cholesterol: 217 mg/dL — ABNORMAL HIGH (ref 125–200)
HDL: 64 mg/dL (ref >=46)
LDL CALC: 130 mg/dL — AB (ref <130)
Total CHOL/HDL Ratio: 3.4 Ratio (ref <=5.0)
Triglycerides: 114 mg/dL (ref <150)
VLDL: 23 mg/dL (ref <30)

## 2015-03-29 LAB — TSH: TSH: 3.051 u[IU]/mL (ref 0.350–4.500)

## 2015-03-30 LAB — VITAMIN D 25 HYDROXY (VIT D DEFICIENCY, FRACTURES): VIT D 25 HYDROXY: 47 ng/mL (ref 30–100)

## 2015-03-31 ENCOUNTER — Encounter: Payer: Self-pay | Admitting: *Deleted

## 2015-05-13 ENCOUNTER — Ambulatory Visit (INDEPENDENT_AMBULATORY_CARE_PROVIDER_SITE_OTHER): Payer: Medicare Other | Admitting: Family Medicine

## 2015-05-13 DIAGNOSIS — M810 Age-related osteoporosis without current pathological fracture: Secondary | ICD-10-CM | POA: Diagnosis not present

## 2015-05-13 MED ORDER — DENOSUMAB 60 MG/ML ~~LOC~~ SOLN
60.0000 mg | Freq: Once | SUBCUTANEOUS | Status: AC
Start: 2015-05-13 — End: 2015-05-13
  Administered 2015-05-13: 60 mg via SUBCUTANEOUS

## 2015-06-21 ENCOUNTER — Encounter: Payer: Self-pay | Admitting: Family Medicine

## 2015-06-21 ENCOUNTER — Ambulatory Visit (INDEPENDENT_AMBULATORY_CARE_PROVIDER_SITE_OTHER): Payer: Medicare HMO | Admitting: Family Medicine

## 2015-06-21 VITALS — BP 122/78 | HR 68 | Temp 98.0°F | Resp 16 | Ht 62.5 in | Wt 130.0 lb

## 2015-06-21 DIAGNOSIS — M810 Age-related osteoporosis without current pathological fracture: Secondary | ICD-10-CM | POA: Diagnosis not present

## 2015-06-21 DIAGNOSIS — E785 Hyperlipidemia, unspecified: Secondary | ICD-10-CM | POA: Diagnosis not present

## 2015-06-21 DIAGNOSIS — E039 Hypothyroidism, unspecified: Secondary | ICD-10-CM | POA: Diagnosis not present

## 2015-06-21 DIAGNOSIS — Z0000 Encounter for general adult medical examination without abnormal findings: Secondary | ICD-10-CM | POA: Diagnosis not present

## 2015-06-21 MED ORDER — THYROID 60 MG PO TABS: 60.0000 mg | ORAL_TABLET | Freq: Every day | ORAL | Status: DC

## 2015-06-21 NOTE — Progress Notes (Signed)
Subjective:    Patient ID: Joanne Gomez, female    DOB: [birth year], 82 y.o.   MRN: [id number]  HPI   Patient is here today for complete physical exam. Patient had a colonoscopy performed 2015 which was significant for a polyp. No regular surveillance was scheduled due to age. She had a mammogram as well as a bone density test in 2016. She does have osteoporosis for which she has been on Prolia 4 years and her T score improved on her last bone density test.  Patient's immunizations are all up-to-date. her fasting lab work from November was reviewed in the office today and was normal: Lab on 03/29/2015  Component Date Value Ref Range Status  . Sodium 03/29/2015 142  135 - 146 mmol/L Final  . Potassium 03/29/2015 4.7  3.5 - 5.3 mmol/L Final  . Chloride 03/29/2015 104  98 - 110 mmol/L Final  . CO2 03/29/2015 31  20 - 31 mmol/L Final  . Glucose, Bld 03/29/2015 93  70 - 99 mg/dL Final  . BUN 03/29/2015 9  7 - 25 mg/dL Final  . Creat 03/29/2015 0.66  0.60 - 0.88 mg/dL Final  . Total Bilirubin 03/29/2015 0.4  0.2 - 1.2 mg/dL Final  . Alkaline Phosphatase 03/29/2015 62  33 - 130 U/L Final  . AST 03/29/2015 18  10 - 35 U/L Final  . ALT 03/29/2015 12  6 - 29 U/L Final  . Total Protein 03/29/2015 7.4  6.1 - 8.1 g/dL Final  . Albumin 03/29/2015 4.2  3.6 - 5.1 g/dL Final  . Calcium 03/29/2015 9.8  8.6 - 10.4 mg/dL Final  . GFR, Est African American 03/29/2015 >89  >=60 mL/min Final  . GFR, Est Non African American 03/29/2015 83  >=60 mL/min Final   Comment:   The estimated GFR is a calculation valid for adults (>=89 years old) that uses the CKD-EPI algorithm to adjust for age and sex. It is   not to be used for children, pregnant women, hospitalized patients,    patients on dialysis, or with rapidly changing kidney function. According to the NKDEP, eGFR >89 is normal, 60-89 shows mild impairment, 30-59 shows moderate impairment, 15-29 shows severe impairment and <15 is ESRD.     Marland Kitchen TSH  03/29/2015 3.051  0.350 - 4.500 uIU/mL Final  . Cholesterol 03/29/2015 217* 125 - 200 mg/dL Final  . Triglycerides 03/29/2015 114  <150 mg/dL Final  . HDL 03/29/2015 64  >=46 mg/dL Final  . Total CHOL/HDL Ratio 03/29/2015 3.4  <=5.0 Ratio Final  . VLDL 03/29/2015 23  <30 mg/dL Final  . LDL Cholesterol 03/29/2015 130* <130 mg/dL Final   Comment:   Total Cholesterol/HDL Ratio:CHD Risk                        Coronary Heart Disease Risk Table                                        Men       Women          1/2 Average Risk              3.4        3.3              Average Risk  5.0        4.4           2X Average Risk              9.6        7.1           3X Average Risk             23.4       11.0 Use the calculated Patient Ratio above and the CHD Risk table  to determine the patient's CHD Risk.   . WBC 03/29/2015 7.4  4.0 - 10.5 K/uL Final  . RBC 03/29/2015 4.69  3.87 - 5.11 MIL/uL Final  . Hemoglobin 03/29/2015 13.7  12.0 - 15.0 g/dL Final  . HCT 03/29/2015 40.8  36.0 - 46.0 % Final  . MCV 03/29/2015 87.0  78.0 - 100.0 fL Final  . MCH 03/29/2015 29.2  26.0 - 34.0 pg Final  . MCHC 03/29/2015 33.6  30.0 - 36.0 g/dL Final  . RDW 03/29/2015 14.0  11.5 - 15.5 % Final  . Platelets 03/29/2015 249  150 - 400 K/uL Final  . MPV 03/29/2015 10.1  8.6 - 12.4 fL Final  . Neutrophils Relative % 03/29/2015 59  43 - 77 % Final  . Neutro Abs 03/29/2015 4.4  1.7 - 7.7 K/uL Final  . Lymphocytes Relative 03/29/2015 32  12 - 46 % Final  . Lymphs Abs 03/29/2015 2.4  0.7 - 4.0 K/uL Final  . Monocytes Relative 03/29/2015 7  3 - 12 % Final  . Monocytes Absolute 03/29/2015 0.5  0.1 - 1.0 K/uL Final  . Eosinophils Relative 03/29/2015 2  0 - 5 % Final  . Eosinophils Absolute 03/29/2015 0.1  0.0 - 0.7 K/uL Final  . Basophils Relative 03/29/2015 0  0 - 1 % Final  . Basophils Absolute 03/29/2015 0.0  0.0 - 0.1 K/uL Final  . Smear Review 03/29/2015 Criteria for review not met   Final  . Vit D,  25-Hydroxy 03/29/2015 47  30 - 100 ng/mL Final   Comment: Vitamin D Status           25-OH Vitamin D        Deficiency                <20 ng/mL        Insufficiency         20 - 29 ng/mL        Optimal             > or = 30 ng/mL   For 25-OH Vitamin D testing on patients on D2-supplementation and patients for whom quantitation of D2 and D3 fractions is required, the QuestAssureD 25-OH VIT D, (D2,D3), LC/MS/MS is recommended: order code [id number] (patients > 2 yrs).     Past Medical History  Diagnosis Date  . H/O colon cancer, stage II 11/15/2011  . Hypothyroidism   . Hyperlipidemia   . Osteoporosis   . Cancer ([place]) 11/2007    colon stage II   Past Surgical History  Procedure Laterality Date  . Cholecystectomy    . Hemocolectomy  2009  . Abdominal hysterectomy    . Appendectomy    . Elbow surgery      right   . Vaginal prolapse repair     Current Outpatient Prescriptions on File Prior to Visit  Medication Sig Dispense Refill  . aspirin 81 MG tablet Take 81 mg by mouth daily.    Marland Kitchen  Calcium Carbonate (CALCIUM 600 PO) Take 600 mg by mouth 2 (two) times daily.    Marland Kitchen denosumab (PROLIA) 60 MG/ML SOLN injection Inject 60 mg into the skin every 6 (six) months. Administer in upper arm, thigh, or abdomen    . Fish Oil OIL by Does not apply route.    . Multiple Vitamin (MULTIVITAMIN) tablet Take 1 tablet by mouth daily.       No current facility-administered medications on file prior to visit.   No Known Allergies Social History   Social History  . Marital Status: Married    Spouse Name: N/A  . Number of Children: N/A  . Years of Education: N/A   Occupational History  . Not on file.   Social History Main Topics  . Smoking status: Never Smoker   . Smokeless tobacco: Never Used  . Alcohol Use: No  . Drug Use: No  . Sexual Activity: Yes     Comment: Married to [place], retired.   Other Topics Concern  . Not on file   Social History Narrative   Family History  Problem  Relation Age of Onset  . Heart attack Sister 26  . Hyperlipidemia Sister   . Hypertension Sister      Review of Systems  All other systems reviewed and are negative.      Objective:   Physical Exam  Constitutional: She is oriented to person, place, and time. She appears well-developed and well-nourished. No distress.  HENT:  Head: Normocephalic and atraumatic.  Right Ear: External ear normal.  Left Ear: External ear normal.  Nose: Nose normal.  Mouth/Throat: Oropharynx is clear and moist. No oropharyngeal exudate.  Eyes: Conjunctivae and EOM are normal. Pupils are equal, round, and reactive to light. Left eye exhibits no discharge. No scleral icterus.  Neck: Normal range of motion. Neck supple. No JVD present. No tracheal deviation present. No thyromegaly present.  Cardiovascular: Normal rate, regular rhythm, normal heart sounds and intact distal pulses.  Exam reveals no gallop and no friction rub.   No murmur heard. Pulmonary/Chest: Effort normal and breath sounds normal. No stridor. No respiratory distress. She has no wheezes. She has no rales. She exhibits no tenderness.  Abdominal: Soft. Bowel sounds are normal. She exhibits no distension and no mass. There is no tenderness. There is no rebound and no guarding.  Musculoskeletal: Normal range of motion. She exhibits no edema or tenderness.  Lymphadenopathy:    She has no cervical adenopathy.  Neurological: She is alert and oriented to person, place, and time. She has normal reflexes. No cranial nerve deficit. She exhibits normal muscle tone. Coordination normal.  Skin: Skin is warm. No rash noted. No erythema. No pallor.  Psychiatric: She has a normal mood and affect. Her behavior is normal. Judgment and thought content normal.  Vitals reviewed.         Assessment & Plan:  Hypothyroidism, unspecified hypothyroidism type - Plan: thyroid (ARMOUR) 60 MG tablet  Routine general medical examination at a health care  facility  HLD (hyperlipidemia)  Osteoporosis  Patient's physical exam is completely normal.  Her cancer screening is up-to-date. Regular anticipatory guidance is provided. I will make no changes in her medication at this time. Her lab work is excellent.

## 2015-08-11 ENCOUNTER — Other Ambulatory Visit: Payer: Self-pay | Admitting: Family Medicine

## 2015-08-24 ENCOUNTER — Encounter: Payer: Self-pay | Admitting: *Deleted

## 2015-08-30 ENCOUNTER — Encounter
Admission: RE | Disposition: A | Discharge status: Home / Self Care | Payer: Self-pay | Source: Ambulatory Visit | Attending: Ophthalmology

## 2015-08-30 ENCOUNTER — Ambulatory Visit: Payer: Medicare HMO | Admitting: Anesthesiology

## 2015-08-30 ENCOUNTER — Encounter: Payer: Self-pay | Admitting: *Deleted

## 2015-08-30 ENCOUNTER — Ambulatory Visit
Admission: RE | Admit: 2015-08-30 | Discharge: 2015-08-30 | Disposition: A | Discharge status: Home / Self Care | Payer: Medicare HMO | Source: Ambulatory Visit | Attending: Ophthalmology | Admitting: Ophthalmology

## 2015-08-30 DIAGNOSIS — M810 Age-related osteoporosis without current pathological fracture: Secondary | ICD-10-CM | POA: Diagnosis not present

## 2015-08-30 DIAGNOSIS — H2511 Age-related nuclear cataract, right eye: Secondary | ICD-10-CM | POA: Diagnosis present

## 2015-08-30 DIAGNOSIS — E7800 Pure hypercholesterolemia, unspecified: Secondary | ICD-10-CM | POA: Diagnosis not present

## 2015-08-30 DIAGNOSIS — K449 Diaphragmatic hernia without obstruction or gangrene: Secondary | ICD-10-CM | POA: Diagnosis not present

## 2015-08-30 DIAGNOSIS — Z85038 Personal history of other malignant neoplasm of large intestine: Secondary | ICD-10-CM | POA: Insufficient documentation

## 2015-08-30 DIAGNOSIS — H9190 Unspecified hearing loss, unspecified ear: Secondary | ICD-10-CM | POA: Insufficient documentation

## 2015-08-30 DIAGNOSIS — K219 Gastro-esophageal reflux disease without esophagitis: Secondary | ICD-10-CM | POA: Diagnosis not present

## 2015-08-30 DIAGNOSIS — E039 Hypothyroidism, unspecified: Secondary | ICD-10-CM | POA: Insufficient documentation

## 2015-08-30 DIAGNOSIS — K5790 Diverticulosis of intestine, part unspecified, without perforation or abscess without bleeding: Secondary | ICD-10-CM | POA: Diagnosis not present

## 2015-08-30 HISTORY — DX: Personal history of other diseases of the digestive system: Z87.19

## 2015-08-30 HISTORY — DX: Gastro-esophageal reflux disease without esophagitis: K21.9

## 2015-08-30 HISTORY — DX: Unspecified hearing loss, unspecified ear: H91.90

## 2015-08-30 HISTORY — PX: CATARACT EXTRACTION W/PHACO: SHX586

## 2015-08-30 SURGERY — PHACOEMULSIFICATION, CATARACT, WITH IOL INSERTION
Anesthesia: Monitor Anesthesia Care | Laterality: Right

## 2015-08-30 MED ORDER — POVIDONE-IODINE 5 % OP SOLN
1.0000 "application " | Freq: Once | OPHTHALMIC | Status: AC
  Administered 2015-08-30: 1 via OPHTHALMIC

## 2015-08-30 MED ORDER — EPINEPHRINE HCL 1 MG/ML IJ SOLN
INTRAOCULAR | Status: DC | PRN
  Administered 2015-08-30: 250 mL via OPHTHALMIC

## 2015-08-30 MED ORDER — SODIUM CHLORIDE 0.9 % IV SOLN
INTRAVENOUS | Status: DC
Start: 2015-08-30 — End: 2015-08-30
  Administered 2015-08-30 (×2): via INTRAVENOUS

## 2015-08-30 MED ORDER — MOXIFLOXACIN HCL 0.5 % OP SOLN
OPHTHALMIC | Status: DC | PRN
  Administered 2015-08-30: 2 [drp] via OPHTHALMIC

## 2015-08-30 MED ORDER — FENTANYL CITRATE (PF) 100 MCG/2ML IJ SOLN
INTRAMUSCULAR | Status: DC | PRN
  Administered 2015-08-30: 50 ug via INTRAVENOUS

## 2015-08-30 MED ORDER — CEFUROXIME OPHTHALMIC INJECTION 1 MG/0.1 ML
INJECTION | OPHTHALMIC | Status: AC
  Filled 2015-08-30: qty 0.1

## 2015-08-30 MED ORDER — CEFUROXIME OPHTHALMIC INJECTION 1 MG/0.1 ML
INJECTION | OPHTHALMIC | Status: DC | PRN
  Administered 2015-08-30: 0.1 mL via INTRACAMERAL

## 2015-08-30 MED ORDER — MIDAZOLAM HCL 2 MG/2ML IJ SOLN
INTRAMUSCULAR | Status: DC | PRN
  Administered 2015-08-30: 1 mg via INTRAVENOUS

## 2015-08-30 MED ORDER — NA CHONDROIT SULF-NA HYALURON 40-17 MG/ML IO SOLN
INTRAOCULAR | Status: AC
  Filled 2015-08-30: qty 1

## 2015-08-30 MED ORDER — NA CHONDROIT SULF-NA HYALURON 40-17 MG/ML IO SOLN
INTRAOCULAR | Status: DC | PRN
  Administered 2015-08-30: 1 mL via INTRAOCULAR

## 2015-08-30 MED ORDER — EPINEPHRINE HCL 1 MG/ML IJ SOLN
INTRAMUSCULAR | Status: AC
  Filled 2015-08-30: qty 1

## 2015-08-30 MED ORDER — MOXIFLOXACIN HCL 0.5 % OP SOLN: 1.0000 [drp] | OPHTHALMIC | Status: DC | PRN

## 2015-08-30 MED ORDER — ARMC OPHTHALMIC DILATING GEL
1.0000 "application " | OPHTHALMIC | Status: AC | PRN
  Administered 2015-08-30 (×2): 1 via OPHTHALMIC

## 2015-08-30 MED ORDER — TETRACAINE HCL 0.5 % OP SOLN
1.0000 [drp] | Freq: Once | OPHTHALMIC | Status: AC
  Administered 2015-08-30: 1 [drp] via OPHTHALMIC

## 2015-08-30 SURGICAL SUPPLY — 22 items
CANNULA ANT/CHMB 27GA (MISCELLANEOUS) ×3 IMPLANT
CUP MEDICINE 2OZ PLAST GRAD ST (MISCELLANEOUS) IMPLANT
GLOVE BIO SURGEON STRL SZ8 (GLOVE) ×3 IMPLANT
GLOVE BIOGEL M 6.5 STRL (GLOVE) ×3 IMPLANT
GLOVE SURG LX 8.0 MICRO (GLOVE) ×2
GLOVE SURG LX STRL 8.0 MICRO (GLOVE) ×1 IMPLANT
GOWN STRL REUS W/ TWL LRG LVL3 (GOWN DISPOSABLE) ×2 IMPLANT
GOWN STRL REUS W/TWL LRG LVL3 (GOWN DISPOSABLE) ×4
LENS IOL TECNIS 22.5 (Intraocular Lens) ×3 IMPLANT
LENS IOL TECNIS MONO 1P 22.5 (Intraocular Lens) ×1 IMPLANT
PACK CATARACT (MISCELLANEOUS) ×3 IMPLANT
PACK CATARACT BRASINGTON LX (MISCELLANEOUS) ×3 IMPLANT
PACK EYE AFTER SURG (MISCELLANEOUS) ×3 IMPLANT
SOL BSS BAG (MISCELLANEOUS) ×3
SOL PREP PVP 2OZ (MISCELLANEOUS)
SOLUTION BSS BAG (MISCELLANEOUS) ×1 IMPLANT
SOLUTION PREP PVP 2OZ (MISCELLANEOUS) IMPLANT
SYR 3ML LL SCALE MARK (SYRINGE) ×3 IMPLANT
SYR 5ML LL (SYRINGE) ×3 IMPLANT
SYR TB 1ML 27GX1/2 LL (SYRINGE) ×3 IMPLANT
WATER STERILE IRR 1000ML POUR (IV SOLUTION) ×3 IMPLANT
WIPE NON LINTING 3.25X3.25 (MISCELLANEOUS) ×3 IMPLANT

## 2015-08-30 NOTE — Anesthesia Postprocedure Evaluation (Signed)
Anesthesia Post Note  Patient: Joanne Gomez  Procedure(s) Performed: Procedure(s) (LRB): CATARACT EXTRACTION PHACO AND INTRAOCULAR LENS PLACEMENT (IOC) (Right)  Patient location during evaluation: PACU Anesthesia Type: MAC Level of consciousness: awake, awake and alert and oriented Pain management: pain level controlled Vital Signs Assessment: post-procedure vital signs reviewed and stable Respiratory status: spontaneous breathing, nonlabored ventilation and respiratory function stable Cardiovascular status: blood pressure returned to baseline Postop Assessment: no headache Anesthetic complications: no    Last Vitals:  Filed Vitals:   08/30/15 0637  BP: 176/76  Pulse: 69  Temp: 36.4 C  Resp: 18    Last Pain: There were no vitals filed for this visit.               Davonda Ausley,  [employer]

## 2015-08-30 NOTE — Transfer of Care (Signed)
Immediate Anesthesia Transfer of Care Note  Patient: Joanne Gomez  Procedure(s) Performed: Procedure(s) with comments: CATARACT EXTRACTION PHACO AND INTRAOCULAR LENS PLACEMENT (IOC) (Right) -  [place]    00:40.8 AP%  15.7 CDE    6.42   fluid pack lot [id number]  exp 02/24/2017  Patient Location: PACU  Anesthesia Type:MAC  Level of Consciousness: awake, alert  and oriented  Airway & Oxygen Therapy: Patient Spontanous Breathing  Post-op Assessment: Report given to RN and Post -op Vital signs reviewed and stable  Post vital signs: Reviewed and stable  Last Vitals:  Filed Vitals:   08/30/15 0637  BP: 176/76  Pulse: 69  Temp: 36.4 C  Resp: 18    Complications: No apparent anesthesia complications

## 2015-08-30 NOTE — Anesthesia Preprocedure Evaluation (Signed)
Anesthesia Evaluation  Patient identified by MRN, date of birth, ID band Patient awake    Reviewed: Allergy & Precautions, NPO status , Patient's Chart, lab work & pertinent test results  History of Anesthesia Complications (+) history of anesthetic complications  Airway Mallampati: II       Dental   Pulmonary neg pulmonary ROS,           Cardiovascular hypertension (pt denies),      Neuro/Psych negative neurological ROS     GI/Hepatic Neg liver ROS, hiatal hernia, GERD  Poorly Controlled,  Endo/Other  Hypothyroidism   Renal/GU negative Renal ROS     Musculoskeletal   Abdominal   Peds  Hematology negative hematology ROS (+)   Anesthesia Other Findings   Reproductive/Obstetrics                             Anesthesia Physical Anesthesia Plan  ASA: II  Anesthesia Plan: MAC   Post-op Pain Management:    Induction: Intravenous  Airway Management Planned: Nasal Cannula  Additional Equipment:   Intra-op Plan:   Post-operative Plan:   Informed Consent: I have reviewed the patients History and Physical, chart, labs and discussed the procedure including the risks, benefits and alternatives for the proposed anesthesia with the patient or authorized representative who has indicated his/her understanding and acceptance.     Plan Discussed with:   Anesthesia Plan Comments:         Anesthesia Quick Evaluation

## 2015-08-30 NOTE — Op Note (Signed)
PREOPERATIVE DIAGNOSIS:  Nuclear sclerotic cataract of the right eye.   POSTOPERATIVE DIAGNOSIS: nuclear sclerotic cataract right eye   OPERATIVE PROCEDURE:  Procedure(s): CATARACT EXTRACTION PHACO AND INTRAOCULAR LENS PLACEMENT (IOC)   SURGEON:  Birder Robson, MD.   ANESTHESIA:  Anesthesiologist: Gunnar Fusi, MD CRNA: Nelda Marseille, CRNA  1.      Managed anesthesia care. 2.      Topical tetracaine drops followed by 2% Xylocaine jelly applied in the preoperative holding area.   COMPLICATIONS:  None.   TECHNIQUE:   Stop and chop   DESCRIPTION OF PROCEDURE:  The patient was examined and consented in the preoperative holding area where the aforementioned topical anesthesia was applied to the right eye and then brought back to the Operating Room where the right eye was prepped and draped in the usual sterile ophthalmic fashion and a lid speculum was placed. A paracentesis was created with the side port blade and the anterior chamber was filled with viscoelastic. A near clear corneal incision was performed with the steel keratome. A continuous curvilinear capsulorrhexis was performed with a cystotome followed by the capsulorrhexis forceps. Hydrodissection and hydrodelineation were carried out with BSS on a blunt cannula. The lens was removed in a stop and chop  technique and the remaining cortical material was removed with the irrigation-aspiration handpiece. The capsular bag was inflated with viscoelastic and the Technis ZCB00  lens was placed in the capsular bag without complication. The remaining viscoelastic was removed from the eye with the irrigation-aspiration handpiece. The wounds were hydrated. The anterior chamber was flushed with Miostat and the eye was inflated to physiologic pressure. 0.1 mL of cefuroxime concentration 10 mg/mL was placed in the anterior chamber. The wounds were found to be water tight. The eye was dressed with Vigamox. The patient was given protective glasses to  wear throughout the day and a shield with which to sleep tonight. The patient was also given drops with which to begin a drop regimen today and will follow-up with me in one day.  Implant Name Type Inv. Item Serial No. Manufacturer Lot No. LRB No. Used  LENS IOL TECNIS 22.5 - [id number] Intraocular Lens LENS IOL TECNIS 22.5 [id number] Right 1   Procedure(s) with comments: CATARACT EXTRACTION PHACO AND INTRAOCULAR LENS PLACEMENT (IOC) (Right) -  [place]    00:40.8 AP%  15.7 CDE    6.42   fluid pack lot [id number]  exp 02/24/2017  Electronically signed: Henessy Rohrer LOUIS 08/30/2015 8:18 AM

## 2015-08-30 NOTE — Anesthesia Procedure Notes (Signed)
Date/Time: 08/30/2015 8:09 AM Performed by: Nelda Marseille Pre-anesthesia Checklist: Patient identified, Emergency Drugs available, Suction available, Patient being monitored and Timeout performed Oxygen Delivery Method: Nasal cannula

## 2015-08-30 NOTE — H&P (Signed)
All labs reviewed. Abnormal studies sent to patients PCP when indicated.  Previous H&P reviewed, patient examined, there are NO CHANGES.  Joanne Hinzman LOUIS4/4/20177:51 AM

## 2015-08-30 NOTE — Discharge Instructions (Signed)
Eye Surgery Discharge Instructions  Expect mild scratchy sensation or mild soreness. DO NOT RUB YOUR EYE!  The day of surgery:  Minimal physical activity, but bed rest is not required  No reading, computer work, or close hand work  No bending, lifting, or straining.  May watch TV  For 24 hours:  No driving, legal decisions, or alcoholic beverages  Safety precautions  Eat anything you prefer: It is better to start with liquids, then soup then solid foods.  _____ Eye patch should be worn until postoperative exam tomorrow.  ____ Solar shield eyeglasses should be worn for comfort in the sunlight/patch while sleeping  Resume all regular medications including aspirin or Coumadin if these were discontinued prior to surgery. You may shower, bathe, shave, or wash your hair. Tylenol may be taken for mild discomfort.  Call your doctor if you experience significant pain, nausea, or vomiting, fever > 101 or other signs of infection. [phone] or [phone] Specific instructions:  Follow-up Information    Follow up with Tim Lair, MD On 08/31/2015.   Specialty:  Ophthalmology   Why:  9:00   Contact information:   [address] [place] [address] [phone]

## 2015-09-12 ENCOUNTER — Encounter: Payer: Self-pay | Admitting: *Deleted

## 2015-09-20 ENCOUNTER — Ambulatory Visit
Admission: RE | Admit: 2015-09-20 | Discharge: 2015-09-20 | Disposition: A | Discharge status: Home / Self Care | Payer: Medicare HMO | Source: Ambulatory Visit | Attending: Ophthalmology | Admitting: Ophthalmology

## 2015-09-20 ENCOUNTER — Ambulatory Visit: Payer: Medicare HMO | Admitting: Anesthesiology

## 2015-09-20 ENCOUNTER — Encounter
Admission: RE | Disposition: A | Discharge status: Home / Self Care | Payer: Self-pay | Source: Ambulatory Visit | Attending: Ophthalmology

## 2015-09-20 ENCOUNTER — Encounter: Payer: Self-pay | Admitting: Anesthesiology

## 2015-09-20 DIAGNOSIS — E7800 Pure hypercholesterolemia, unspecified: Secondary | ICD-10-CM | POA: Insufficient documentation

## 2015-09-20 DIAGNOSIS — I10 Essential (primary) hypertension: Secondary | ICD-10-CM | POA: Diagnosis not present

## 2015-09-20 DIAGNOSIS — H2512 Age-related nuclear cataract, left eye: Secondary | ICD-10-CM | POA: Insufficient documentation

## 2015-09-20 DIAGNOSIS — M1990 Unspecified osteoarthritis, unspecified site: Secondary | ICD-10-CM | POA: Diagnosis not present

## 2015-09-20 DIAGNOSIS — H9190 Unspecified hearing loss, unspecified ear: Secondary | ICD-10-CM | POA: Insufficient documentation

## 2015-09-20 DIAGNOSIS — K449 Diaphragmatic hernia without obstruction or gangrene: Secondary | ICD-10-CM | POA: Insufficient documentation

## 2015-09-20 DIAGNOSIS — K219 Gastro-esophageal reflux disease without esophagitis: Secondary | ICD-10-CM | POA: Insufficient documentation

## 2015-09-20 DIAGNOSIS — Z85038 Personal history of other malignant neoplasm of large intestine: Secondary | ICD-10-CM | POA: Insufficient documentation

## 2015-09-20 DIAGNOSIS — E039 Hypothyroidism, unspecified: Secondary | ICD-10-CM | POA: Insufficient documentation

## 2015-09-20 DIAGNOSIS — Z9841 Cataract extraction status, right eye: Secondary | ICD-10-CM | POA: Insufficient documentation

## 2015-09-20 DIAGNOSIS — M810 Age-related osteoporosis without current pathological fracture: Secondary | ICD-10-CM | POA: Diagnosis not present

## 2015-09-20 DIAGNOSIS — K5790 Diverticulosis of intestine, part unspecified, without perforation or abscess without bleeding: Secondary | ICD-10-CM | POA: Diagnosis not present

## 2015-09-20 HISTORY — DX: Diaphragmatic hernia without obstruction or gangrene: K44.9

## 2015-09-20 HISTORY — DX: Diverticulosis of large intestine without perforation or abscess without bleeding: K57.30

## 2015-09-20 HISTORY — DX: Pneumonia, unspecified organism: J18.9

## 2015-09-20 HISTORY — PX: CATARACT EXTRACTION W/PHACO: SHX586

## 2015-09-20 SURGERY — PHACOEMULSIFICATION, CATARACT, WITH IOL INSERTION
Anesthesia: Monitor Anesthesia Care | Site: Eye | Laterality: Left | Wound class: Clean

## 2015-09-20 MED ORDER — NA CHONDROIT SULF-NA HYALURON 40-17 MG/ML IO SOLN
INTRAOCULAR | Status: AC
  Filled 2015-09-20: qty 1

## 2015-09-20 MED ORDER — CARBACHOL 0.01 % IO SOLN
INTRAOCULAR | Status: DC | PRN
  Administered 2015-09-20: 0.5 mL via INTRAOCULAR

## 2015-09-20 MED ORDER — ARMC OPHTHALMIC DILATING GEL
1.0000 "application " | Freq: Two times a day (BID) | OPHTHALMIC | Status: AC | PRN
  Administered 2015-09-20 (×2): 1 via OPHTHALMIC

## 2015-09-20 MED ORDER — TETRACAINE HCL 0.5 % OP SOLN
1.0000 [drp] | Freq: Once | OPHTHALMIC | Status: AC
  Administered 2015-09-20: 1 [drp] via OPHTHALMIC

## 2015-09-20 MED ORDER — EPINEPHRINE HCL 1 MG/ML IJ SOLN
INTRAMUSCULAR | Status: AC
  Filled 2015-09-20: qty 1

## 2015-09-20 MED ORDER — ARMC OPHTHALMIC DILATING GEL
1.0000 | OPHTHALMIC | Status: DC
Start: 2015-09-20 — End: 2015-09-20

## 2015-09-20 MED ORDER — MOXIFLOXACIN HCL 0.5 % OP SOLN
1.0000 [drp] | OPHTHALMIC | Status: DC | PRN
Start: 2015-09-20 — End: 2015-09-20

## 2015-09-20 MED ORDER — CEFUROXIME OPHTHALMIC INJECTION 1 MG/0.1 ML
INJECTION | OPHTHALMIC | Status: DC | PRN
  Administered 2015-09-20: 0.1 mL via INTRACAMERAL

## 2015-09-20 MED ORDER — SODIUM CHLORIDE 0.9 % IV SOLN
INTRAVENOUS | Status: DC
  Administered 2015-09-20: 09:00:00 via INTRAVENOUS

## 2015-09-20 MED ORDER — POVIDONE-IODINE 5 % OP SOLN
1.0000 "application " | Freq: Once | OPHTHALMIC | Status: AC
  Administered 2015-09-20: 1 via OPHTHALMIC

## 2015-09-20 MED ORDER — NA CHONDROIT SULF-NA HYALURON 40-17 MG/ML IO SOLN
INTRAOCULAR | Status: DC | PRN
  Administered 2015-09-20: 1 mL via INTRAOCULAR

## 2015-09-20 MED ORDER — EPINEPHRINE HCL 1 MG/ML IJ SOLN
INTRAOCULAR | Status: DC | PRN
  Administered 2015-09-20: 10:00:00 via OPHTHALMIC

## 2015-09-20 MED ORDER — CEFUROXIME OPHTHALMIC INJECTION 1 MG/0.1 ML
INJECTION | OPHTHALMIC | Status: AC
  Filled 2015-09-20: qty 0.1

## 2015-09-20 MED ORDER — MOXIFLOXACIN HCL 0.5 % OP SOLN
OPHTHALMIC | Status: DC | PRN
Start: 2015-09-20 — End: 2015-09-20
  Administered 2015-09-20: 1 [drp] via OPHTHALMIC

## 2015-09-20 SURGICAL SUPPLY — 22 items
CANNULA ANT/CHMB 27GA (MISCELLANEOUS) ×3 IMPLANT
CUP MEDICINE 2OZ PLAST GRAD ST (MISCELLANEOUS) ×3 IMPLANT
GLOVE BIO SURGEON STRL SZ8 (GLOVE) ×3 IMPLANT
GLOVE BIOGEL M 6.5 STRL (GLOVE) ×3 IMPLANT
GLOVE SURG LX 8.0 MICRO (GLOVE) ×2
GLOVE SURG LX STRL 8.0 MICRO (GLOVE) ×1 IMPLANT
GOWN STRL REUS W/ TWL LRG LVL3 (GOWN DISPOSABLE) ×2 IMPLANT
GOWN STRL REUS W/TWL LRG LVL3 (GOWN DISPOSABLE) ×4
LENS IOL TECNIS 22.5 (Intraocular Lens) ×3 IMPLANT
LENS IOL TECNIS MONO 1P 22.5 (Intraocular Lens) ×1 IMPLANT
PACK CATARACT (MISCELLANEOUS) ×3 IMPLANT
PACK CATARACT BRASINGTON LX (MISCELLANEOUS) ×3 IMPLANT
PACK EYE AFTER SURG (MISCELLANEOUS) ×3 IMPLANT
SOL BSS BAG (MISCELLANEOUS) ×3
SOL PREP PVP 2OZ (MISCELLANEOUS) ×3
SOLUTION BSS BAG (MISCELLANEOUS) ×1 IMPLANT
SOLUTION PREP PVP 2OZ (MISCELLANEOUS) ×1 IMPLANT
SYR 3ML LL SCALE MARK (SYRINGE) ×3 IMPLANT
SYR 5ML LL (SYRINGE) ×3 IMPLANT
SYR TB 1ML 27GX1/2 LL (SYRINGE) ×3 IMPLANT
WATER STERILE IRR 1000ML POUR (IV SOLUTION) ×3 IMPLANT
WIPE NON LINTING 3.25X3.25 (MISCELLANEOUS) ×3 IMPLANT

## 2015-09-20 NOTE — Transfer of Care (Signed)
Immediate Anesthesia Transfer of Care Note  Patient: Joanne Gomez  Procedure(s) Performed: Procedure(s) with comments: CATARACT EXTRACTION PHACO AND INTRAOCULAR LENS PLACEMENT (IOC) (Left) - [place] 44.8 AP% 19.6 CDE 8.77 FLUID PACK LOT # [id number]  Patient Location: PACU and Short Stay  Anesthesia Type:MAC  Level of Consciousness: awake, alert  and oriented  Airway & Oxygen Therapy: Patient Spontanous Breathing  Post-op Assessment: Report given to RN and Post -op Vital signs reviewed and stable  Post vital signs: Reviewed and stable  Last Vitals:  Filed Vitals:   09/20/15 0903 09/20/15 1030  BP: 173/86   Pulse: 84   Temp: 36 C 36.9 C  Resp: 16     Complications: No apparent anesthesia complications

## 2015-09-20 NOTE — Discharge Instructions (Signed)
AMBULATORY SURGERY  DISCHARGE INSTRUCTIONS   1) The drugs that you were given will stay in your system until tomorrow so for the next 24 hours you should not:  A) Drive an automobile B) Make any legal decisions C) Drink any alcoholic beverage   2) You may resume regular meals tomorrow.  Today it is better to start with liquids and gradually work up to solid foods.  You may eat anything you prefer, but it is better to start with liquids, then soup and crackers, and gradually work up to solid foods.   3) Please notify your doctor immediately if you have any unusual bleeding, trouble breathing, redness and pain at the surgery site, drainage, fever, or pain not relieved by medication.    4) Additional Instructions:        Please contact your physician with any problems or Same Day Surgery at [phone], Monday through Friday 6 am to 4 pm, or [place] at [facility] number at [phone].   AMBULATORY SURGERY  DISCHARGE INSTRUCTIONS   5) The drugs that you were given will stay in your system until tomorrow so for the next 24 hours you should not:  D) Drive an automobile E) Make any legal decisions F) Drink any alcoholic beverage   6) You may resume regular meals tomorrow.  Today it is better to start with liquids and gradually work up to solid foods.  You may eat anything you prefer, but it is better to start with liquids, then soup and crackers, and gradually work up to solid foods.   7) Please notify your doctor immediately if you have any unusual bleeding, trouble breathing, redness and pain at the surgery site, drainage, fever, or pain not relieved by medication.    8) Additional Instructions:        Please contact your physician with any problems or Same Day Surgery at [phone], Monday through Friday 6 am to 4 pm, or [place] at [facility] number at [phone].   Eye Surgery Discharge Instructions  Expect mild scratchy sensation or  mild soreness. DO NOT RUB YOUR EYE!  The day of surgery:  Minimal physical activity, but bed rest is not required  No reading, computer work, or close hand work  No bending, lifting, or straining.  May watch TV  For 24 hours:  No driving, legal decisions, or alcoholic beverages  Safety precautions  Eat anything you prefer: It is better to start with liquids, then soup then solid foods.  _____ Eye patch should be worn until postoperative exam tomorrow.  ____ Solar shield eyeglasses should be worn for comfort in the sunlight/patch while sleeping  Resume all regular medications including aspirin or Coumadin if these were discontinued prior to surgery. You may shower, bathe, shave, or wash your hair. Tylenol may be taken for mild discomfort.  Call your doctor if you experience significant pain, nausea, or vomiting, fever > 101 or other signs of infection. [phone] or [phone] Specific instructions:  Follow-up Information    Follow up with PORFILIO,WILLIAM LOUIS, MD In 1 day.   Specialty:  Ophthalmology   Why:  April 26 at 9:40am   Contact information:   [address] [place] [address] [phone]

## 2015-09-20 NOTE — Anesthesia Postprocedure Evaluation (Signed)
Anesthesia Post Note  Patient: Joanne Gomez  Procedure(s) Performed: Procedure(s) (LRB): CATARACT EXTRACTION PHACO AND INTRAOCULAR LENS PLACEMENT (IOC) (Left)  Patient location during evaluation: PACU Anesthesia Type: MAC Level of consciousness: awake and alert and oriented Pain management: pain level controlled Vital Signs Assessment: post-procedure vital signs reviewed and stable Respiratory status: spontaneous breathing Cardiovascular status: blood pressure returned to baseline Anesthetic complications: no    Last Vitals:  Filed Vitals:   09/20/15 0903 09/20/15 1030  BP: 173/86 143/71  Pulse: 84 68  Temp: 36 C 36.9 C  Resp: 16 16    Last Pain: There were no vitals filed for this visit.               Natelie Ostrosky

## 2015-09-20 NOTE — Anesthesia Procedure Notes (Signed)
Date/Time: 09/20/2015 10:13 AM Performed by: Kennon Holter Pre-anesthesia Checklist: Timeout performed, Patient being monitored, Suction available, Emergency Drugs available and Patient identified Oxygen Delivery Method: Nasal cannula Placement Confirmation: positive ETCO2

## 2015-09-20 NOTE — H&P (Signed)
  All labs reviewed. Abnormal studies sent to patients PCP when indicated.  Previous H&P reviewed, patient examined, there are NO CHANGES.  Joanne Gomez LOUIS4/25/201710:05 AM

## 2015-09-20 NOTE — Anesthesia Preprocedure Evaluation (Signed)
Anesthesia Evaluation  Patient identified by MRN, date of birth, ID band Patient awake    Reviewed: Allergy & Precautions, NPO status , Patient's Chart, lab work & pertinent test results  History of Anesthesia Complications (+) history of anesthetic complications  Airway Mallampati: II       Dental   Pulmonary neg pulmonary ROS, pneumonia, resolved,    Pulmonary exam normal breath sounds clear to auscultation       Cardiovascular hypertension, Normal cardiovascular exam     Neuro/Psych negative neurological ROS  negative psych ROS   GI/Hepatic Neg liver ROS, hiatal hernia, GERD  Medicated and Controlled,diverticulosis   Endo/Other  Hypothyroidism   Renal/GU negative Renal ROS  negative genitourinary   Musculoskeletal negative musculoskeletal ROS (+)   Abdominal Normal abdominal exam  (+)   Peds negative pediatric ROS (+)  Hematology negative hematology ROS (+)   Anesthesia Other Findings Colon CA  Reproductive/Obstetrics                             Anesthesia Physical  Anesthesia Plan  ASA: II  Anesthesia Plan: MAC   Post-op Pain Management:    Induction: Intravenous  Airway Management Planned: Nasal Cannula  Additional Equipment:   Intra-op Plan:   Post-operative Plan:   Informed Consent: I have reviewed the patients History and Physical, chart, labs and discussed the procedure including the risks, benefits and alternatives for the proposed anesthesia with the patient or authorized representative who has indicated his/her understanding and acceptance.     Plan Discussed with: CRNA and Surgeon  Anesthesia Plan Comments:         Anesthesia Quick Evaluation

## 2015-09-20 NOTE — Op Note (Signed)
PREOPERATIVE DIAGNOSIS:  Nuclear sclerotic cataract of the left eye.   POSTOPERATIVE DIAGNOSIS:  LEFT NUCLEAR SCLEROTIC CATARACT   OPERATIVE PROCEDURE:  Procedure(s): CATARACT EXTRACTION PHACO AND INTRAOCULAR LENS PLACEMENT (IOC)   SURGEON:  Birder Robson, MD.   ANESTHESIA:   Anesthesiologist: Alvin Critchley, MD CRNA: Kennon Holter, CRNA  1.      Managed anesthesia care. 2.      Topical tetracaine drops followed by 2% Xylocaine jelly applied in the preoperative holding area.   COMPLICATIONS:  None.   TECHNIQUE:   Stop and chop   DESCRIPTION OF PROCEDURE:  The patient was examined and consented in the preoperative holding area where the aforementioned topical anesthesia was applied to the left eye and then brought back to the Operating Room where the left eye was prepped and draped in the usual sterile ophthalmic fashion and a lid speculum was placed. A paracentesis was created with the side port blade and the anterior chamber was filled with viscoelastic. A near clear corneal incision was performed with the steel keratome. A continuous curvilinear capsulorrhexis was performed with a cystotome followed by the capsulorrhexis forceps. Hydrodissection and hydrodelineation were carried out with BSS on a blunt cannula. The lens was removed in a stop and chop  technique and the remaining cortical material was removed with the irrigation-aspiration handpiece. The capsular bag was inflated with viscoelastic and the Technis ZCB00 lens was placed in the capsular bag without complication. The remaining viscoelastic was removed from the eye with the irrigation-aspiration handpiece. The wounds were hydrated. The anterior chamber was flushed with Miostat and the eye was inflated to physiologic pressure. 0.1 mL of cefuroxime concentration 10 mg/mL was placed in the anterior chamber. The wounds were found to be water tight. The eye was dressed with Vigamox. The patient was given protective glasses to wear  throughout the day and a shield with which to sleep tonight. The patient was also given drops with which to begin a drop regimen today and will follow-up with me in one day.  Implant Name Type Inv. Item Serial No. Manufacturer Lot No. LRB No. Used  LENS IOL TECNIS 22.5 - [id number] Intraocular Lens LENS IOL TECNIS 22.5 [id number] AMO   Left 1   Procedure(s) with comments: CATARACT EXTRACTION PHACO AND INTRAOCULAR LENS PLACEMENT (IOC) (Left) - [place] 44.8 AP% 19.6 CDE 8.77 FLUID PACK LOT # [id number]  Electronically signed: Peighton Edgin LOUIS 09/20/2015 10:28 AM

## 2015-10-19 ENCOUNTER — Encounter: Payer: Self-pay | Admitting: Ophthalmology

## 2015-11-25 ENCOUNTER — Other Ambulatory Visit: Payer: Medicare HMO

## 2015-11-25 DIAGNOSIS — E039 Hypothyroidism, unspecified: Secondary | ICD-10-CM

## 2015-11-25 DIAGNOSIS — E785 Hyperlipidemia, unspecified: Secondary | ICD-10-CM

## 2015-11-25 DIAGNOSIS — Z79899 Other long term (current) drug therapy: Secondary | ICD-10-CM

## 2015-11-25 DIAGNOSIS — I10 Essential (primary) hypertension: Secondary | ICD-10-CM

## 2015-11-25 LAB — COMPLETE METABOLIC PANEL WITH GFR
ALBUMIN: 4.3 g/dL (ref 3.6–5.1)
ALK PHOS: 50 U/L (ref 33–130)
ALT: 11 U/L (ref 6–29)
AST: 17 U/L (ref 10–35)
BILIRUBIN TOTAL: 0.5 mg/dL (ref 0.2–1.2)
BUN: 13 mg/dL (ref 7–25)
CO2: 25 mmol/L (ref 20–31)
CREATININE: 0.62 mg/dL (ref 0.60–0.88)
Calcium: 9.4 mg/dL (ref 8.6–10.4)
Chloride: 102 mmol/L (ref 98–110)
GFR, EST NON AFRICAN AMERICAN: 84 mL/min (ref >=60)
GFR, Est African American: >89 mL/min (ref >=60)
GLUCOSE: 92 mg/dL (ref 70–99)
Potassium: 4.4 mmol/L (ref 3.5–5.3)
SODIUM: 141 mmol/L (ref 135–146)
TOTAL PROTEIN: 7.1 g/dL (ref 6.1–8.1)

## 2015-11-25 LAB — LIPID PANEL
Cholesterol: 194 mg/dL (ref 125–200)
HDL: 72 mg/dL (ref >=46)
LDL Cholesterol: 105 mg/dL (ref <130)
Total CHOL/HDL Ratio: 2.7 Ratio (ref <=5.0)
Triglycerides: 85 mg/dL (ref <150)
VLDL: 17 mg/dL (ref <30)

## 2015-11-25 LAB — CBC WITH DIFFERENTIAL/PLATELET
BASOS PCT: 1 %
Basophils Absolute: 66 cells/uL (ref 0–200)
EOS PCT: 2 %
Eosinophils Absolute: 132 cells/uL (ref 15–500)
HEMATOCRIT: 40.3 % (ref 35.0–45.0)
HEMOGLOBIN: 13.2 g/dL (ref 12.0–15.0)
LYMPHS ABS: 2112 {cells}/uL (ref 850–3900)
Lymphocytes Relative: 32 %
MCH: 28.8 pg (ref 27.0–33.0)
MCHC: 32.8 g/dL (ref 32.0–36.0)
MCV: 87.8 fL (ref 80.0–100.0)
MONO ABS: 462 {cells}/uL (ref 200–950)
MPV: 10.2 fL (ref 7.5–12.5)
Monocytes Relative: 7 %
NEUTROS ABS: 3828 {cells}/uL (ref 1500–7800)
Neutrophils Relative %: 58 %
Platelets: 232 10*3/uL (ref 140–400)
RBC: 4.59 MIL/uL (ref 3.80–5.10)
RDW: 13.6 % (ref 11.0–15.0)
WBC: 6.6 10*3/uL (ref 3.8–10.8)

## 2015-11-25 LAB — TSH: TSH: 1.81 mIU/L

## 2015-11-30 ENCOUNTER — Telehealth: Payer: Self-pay | Admitting: Family Medicine

## 2015-11-30 NOTE — Telephone Encounter (Signed)
PA submitted by [employer] for Prolia through [employer].  Approved for 2 injections 11/11/15 - 11/10/16  Ref # [id number].  Pt is aware

## 2015-12-01 ENCOUNTER — Ambulatory Visit (INDEPENDENT_AMBULATORY_CARE_PROVIDER_SITE_OTHER): Payer: Medicare HMO | Admitting: Family Medicine

## 2015-12-01 ENCOUNTER — Other Ambulatory Visit: Payer: Self-pay | Admitting: Family Medicine

## 2015-12-01 ENCOUNTER — Encounter: Payer: Self-pay | Admitting: Family Medicine

## 2015-12-01 VITALS — BP 166/84 | HR 80 | Temp 98.1°F | Resp 16 | Ht 62.5 in | Wt 129.0 lb

## 2015-12-01 DIAGNOSIS — R0789 Other chest pain: Secondary | ICD-10-CM

## 2015-12-01 DIAGNOSIS — E785 Hyperlipidemia, unspecified: Secondary | ICD-10-CM

## 2015-12-01 DIAGNOSIS — M810 Age-related osteoporosis without current pathological fracture: Secondary | ICD-10-CM

## 2015-12-01 DIAGNOSIS — E039 Hypothyroidism, unspecified: Secondary | ICD-10-CM | POA: Diagnosis not present

## 2015-12-01 MED ORDER — ESOMEPRAZOLE MAGNESIUM 40 MG PO CPDR: 40.0000 mg | DELAYED_RELEASE_CAPSULE | Freq: Every day | ORAL | Status: DC

## 2015-12-01 MED ORDER — DENOSUMAB 60 MG/ML ~~LOC~~ SOLN
60.0000 mg | Freq: Once | SUBCUTANEOUS | Status: AC
  Administered 2015-12-01: 60 mg via SUBCUTANEOUS

## 2015-12-01 NOTE — Progress Notes (Signed)
Subjective:    Patient ID: Joanne Gomez, female    DOB: Jul 29, 1930, 82 y.o.   MRN: [id number]  HPI Patient is here today for follow-up. Her thyroid test is excellent indicating that the dosage of Armour Thyroid she is taking is appropriate. Her cholesterol is outstanding. However her blood pressure today is elevated at 166/84. She thinks this is due to whitecoat syndrome. However she is not checking her blood pressure at home. She also reports dysphagia. She reports chest pain from her xiphoid process to her cricoid cartilage that occurs with swallowing and eating. She also reports acid reflux and occasionally food sticking.  Lab on 11/25/2015  Component Date Value Ref Range Status  . Sodium 11/25/2015 141  135 - 146 mmol/L Final  . Potassium 11/25/2015 4.4  3.5 - 5.3 mmol/L Final  . Chloride 11/25/2015 102  98 - 110 mmol/L Final  . CO2 11/25/2015 25  20 - 31 mmol/L Final  . Glucose, Bld 11/25/2015 92  70 - 99 mg/dL Final  . BUN 11/25/2015 13  7 - 25 mg/dL Final  . Creat 11/25/2015 0.62  0.60 - 0.88 mg/dL Final   Comment:   For patients > or = 80 years of age: The upper reference limit for Creatinine is approximately 13% higher for people identified as African-American.     . Total Bilirubin 11/25/2015 0.5  0.2 - 1.2 mg/dL Final  . Alkaline Phosphatase 11/25/2015 50  33 - 130 U/L Final  . AST 11/25/2015 17  10 - 35 U/L Final  . ALT 11/25/2015 11  6 - 29 U/L Final  . Total Protein 11/25/2015 7.1  6.1 - 8.1 g/dL Final  . Albumin 11/25/2015 4.3  3.6 - 5.1 g/dL Final  . Calcium 11/25/2015 9.4  8.6 - 10.4 mg/dL Final  . GFR, Est African American 11/25/2015 >89  >=60 mL/min Final  . GFR, Est Non African American 11/25/2015 84  >=60 mL/min Final  . Cholesterol 11/25/2015 194  125 - 200 mg/dL Final  . Triglycerides 11/25/2015 85  <150 mg/dL Final  . HDL 11/25/2015 72  >=46 mg/dL Final  . Total CHOL/HDL Ratio 11/25/2015 2.7  <=5.0 Ratio Final  . VLDL 11/25/2015 17  <30 mg/dL Final  .  LDL Cholesterol 11/25/2015 105  <130 mg/dL Final   Comment:   Total Cholesterol/HDL Ratio:CHD Risk                        Coronary Heart Disease Risk Table                                        Men       Women          1/2 Average Risk              3.4        3.3              Average Risk              5.0        4.4           2X Average Risk              9.6        7.1           3X Average Risk  23.4       11.0 Use the calculated Patient Ratio above and the CHD Risk table  to determine the patient's CHD Risk.   . WBC 11/25/2015 6.6  3.8 - 10.8 K/uL Final  . RBC 11/25/2015 4.59  3.80 - 5.10 MIL/uL Final  . Hemoglobin 11/25/2015 13.2  12.0 - 15.0 g/dL Final  . HCT 11/25/2015 40.3  35.0 - 45.0 % Final  . MCV 11/25/2015 87.8  80.0 - 100.0 fL Final  . MCH 11/25/2015 28.8  27.0 - 33.0 pg Final  . MCHC 11/25/2015 32.8  32.0 - 36.0 g/dL Final  . RDW 11/25/2015 13.6  11.0 - 15.0 % Final  . Platelets 11/25/2015 232  140 - 400 K/uL Final  . MPV 11/25/2015 10.2  7.5 - 12.5 fL Final  . Neutro Abs 11/25/2015 3828  1500 - 7800 cells/uL Final  . Lymphs Abs 11/25/2015 2112  850 - 3900 cells/uL Final  . Monocytes Absolute 11/25/2015 462  200 - 950 cells/uL Final  . Eosinophils Absolute 11/25/2015 132  15 - 500 cells/uL Final  . Basophils Absolute 11/25/2015 66  0 - 200 cells/uL Final  . Neutrophils Relative % 11/25/2015 58   Final  . Lymphocytes Relative 11/25/2015 32   Final  . Monocytes Relative 11/25/2015 7   Final  . Eosinophils Relative 11/25/2015 2   Final  . Basophils Relative 11/25/2015 1   Final  . Smear Review 11/25/2015 Criteria for review not met   Final   ** Please note change in unit of measure and reference range(s). **  . TSH 11/25/2015 1.81   Final   Comment:   Reference Range   > or = 20 Years  0.40-4.50   Pregnancy Range First trimester  0.26-2.66 Second trimester 0.55-2.73 Third trimester  0.43-2.91       Past Medical History  Diagnosis Date  . H/O  colon cancer, stage II 11/15/2011  . Hypothyroidism   . Hyperlipidemia   . Osteoporosis   . GERD (gastroesophageal reflux disease)   . History of hiatal hernia   . Cancer ([place]) 11/2007    colon stage II  . HOH (hard of hearing)     MILD LOSS  . Pneumonia   . Diverticula of colon   . HH (hiatus hernia)    Past Surgical History  Procedure Laterality Date  . Cholecystectomy    . Hemocolectomy  2009  . Abdominal hysterectomy    . Appendectomy    . Elbow surgery      right   . Vaginal prolapse repair    . Cataract extraction w/phaco Right 08/30/2015    Procedure: CATARACT EXTRACTION PHACO AND INTRAOCULAR LENS PLACEMENT (IOC);  Surgeon: Birder Robson, MD;  Location: [facility] ORS;  Service: Ophthalmology;  Laterality: Right;   [place]    00:40.8 AP%  15.7 CDE    6.42   fluid pack lot #[id number] H  exp 02/24/2017  . Cataract extraction w/phaco Left 09/20/2015    Procedure: CATARACT EXTRACTION PHACO AND INTRAOCULAR LENS PLACEMENT (IOC);  Surgeon: Birder Robson, MD;  Location: [facility] ORS;  Service: Ophthalmology;  Laterality: Left;  [place] 44.8 AP% 19.6 CDE 8.77 FLUID PACK LOT # [id number]   Current Outpatient Prescriptions on File Prior to Visit  Medication Sig Dispense Refill  . ARMOUR THYROID 60 MG tablet TAKE ONE TABLET BY MOUTH EVERY DAY BEFORE BREAKFAST (TAKE WITH 15MG TABLET ONCE DAILY TO EQUAL 75MG DOSE) (Patient taking differently: No sig reported) 90 tablet 1  . aspirin  81 MG tablet Take 81 mg by mouth at bedtime.     . Calcium Carbonate (CALCIUM 600 PO) Take 600 mg by mouth 2 (two) times daily.    Marland Kitchen CALCIUM CITRATE PO Take 1 tablet by mouth 2 (two) times daily. 623m    . Cholecalciferol (VITAMIN D3) 2000 units TABS Take 1 tablet by mouth daily.    .Marland Kitchendenosumab (PROLIA) 60 MG/ML SOLN injection Inject 60 mg into the skin every 6 (six) months. Administer in upper arm, thigh, or abdomen    . Magnesium 250 MG TABS Take 1 tablet by mouth daily.    . methylcellulose oral powder Take 1 packet by  mouth daily. 5078m2/day    . Multiple Vitamin (MULTIVITAMIN) tablet Take 1 tablet by mouth daily.      . Earney Navyicarbonate (ZEGERID) 20-1100 MG CAPS capsule Take 1 capsule by mouth daily before breakfast.     No current facility-administered medications on file prior to visit.   No Known Allergies Social History   Social History  . Marital Status: Married    Spouse Name: N/A  . Number of Children: N/A  . Years of Education: N/A   Occupational History  . Not on file.   Social History Main Topics  . Smoking status: Never Smoker   . Smokeless tobacco: Never Used  . Alcohol Use: Yes     Comment: occ  . Drug Use: No  . Sexual Activity: Yes     Comment: Married to PaMeridenretired.   Other Topics Concern  . Not on file   Social History Narrative   Family History  Problem Relation Age of Onset  . Heart attack Sister 6811. Hyperlipidemia Sister   . Hypertension Sister      Review of Systems  All other systems reviewed and are negative.      Objective:   Physical Exam  Constitutional: She appears well-developed and well-nourished. No distress.  HENT:  Head: Normocephalic and atraumatic.  Right Ear: External ear normal.  Left Ear: External ear normal.  Nose: Nose normal.  Mouth/Throat: Oropharynx is clear and moist. No oropharyngeal exudate.  Eyes: Conjunctivae are normal. No scleral icterus.  Neck: Normal range of motion. Neck supple. No JVD present. No thyromegaly present.  Cardiovascular: Normal rate, regular rhythm, normal heart sounds and intact distal pulses.  Exam reveals no gallop and no friction rub.   No murmur heard. Pulmonary/Chest: Effort normal and breath sounds normal. No respiratory distress. She has no wheezes. She has no rales. She exhibits no tenderness.  Abdominal: Soft. Bowel sounds are normal. She exhibits no distension and no mass. There is no tenderness. There is no rebound and no guarding.  Lymphadenopathy:    She has no cervical  adenopathy.  Vitals reviewed.         Assessment & Plan:  Hypothyroidism, unspecified hypothyroidism type  HLD (hyperlipidemia)  Osteoporosis  Other chest pain  Cholesterol is outstanding. Thyroid medication is appropriately dosed. Patient will receive her prolia injection today for her osteoporosis. I believe her chest pain and dysphagia is related to GERD. Increase Nexium to 40 mg a day. Discontinue aspirin. Recheck in 2 weeks. If no better proceed with an EGD. Meanwhile check her blood pressure daily at home and notify me of the values. If persistently greater than 140/90, I will start the patient on an angiotensin receptor blocker

## 2015-12-01 NOTE — Telephone Encounter (Signed)
Refill appropriate and filled per protocol. 

## 2015-12-01 NOTE — Addendum Note (Signed)
Addended by: Shary Decamp B on: 12/01/2015 10:56 AM   Modules accepted: Orders

## 2015-12-23 ENCOUNTER — Telehealth: Payer: Self-pay | Admitting: Family Medicine

## 2015-12-23 NOTE — Telephone Encounter (Signed)
Patient calling stating she has dropped off B/p readings and wanted to discuss.  [id number]

## 2016-01-06 NOTE — Telephone Encounter (Signed)
Spoke to pt and she was to drop off BP reading but have not seen them will call pt Monday to please bring back or call

## 2016-01-16 ENCOUNTER — Ambulatory Visit (HOSPITAL_BASED_OUTPATIENT_CLINIC_OR_DEPARTMENT_OTHER): Payer: Medicare HMO | Admitting: Oncology

## 2016-01-16 ENCOUNTER — Other Ambulatory Visit (HOSPITAL_BASED_OUTPATIENT_CLINIC_OR_DEPARTMENT_OTHER): Payer: Medicare HMO

## 2016-01-16 VITALS — BP 146/76 | HR 80 | Temp 98.1°F | Resp 18 | Ht 62.5 in | Wt 127.6 lb

## 2016-01-16 DIAGNOSIS — Z85038 Personal history of other malignant neoplasm of large intestine: Secondary | ICD-10-CM | POA: Diagnosis not present

## 2016-01-16 LAB — CEA (IN HOUSE-CHCC): CEA (CHCC-In House): 2.29 ng/mL (ref 0.00–5.00)

## 2016-01-16 NOTE — Progress Notes (Signed)
  [place] OFFICE PROGRESS NOTE   Diagnosis: Colon cancer  INTERVAL HISTORY:   [name] returns as scheduled. She feels well. No difficulty with bowel or bladder function. Good appetite. No bleeding.  Objective:  Vital signs in last 24 hours:  Blood pressure (!) 146/76, pulse 80, temperature 98.1 F (36.7 C), temperature source Oral, resp. rate 18, height 5' 2.5" (1.588 m), weight 127 lb 9.6 oz (57.9 kg), SpO2 99 %.    HEENT: Neck without mass Lymphatics: No cervical, supra-clavicular, axillary, or inguinal nodes Resp: Lungs clear bilaterally Cardio: Regular rate and rhythm GI: No hepatosplenomegaly, no mass, nontender Vascular: No leg edema   Lab Results:  Lab Results  Component Value Date   WBC 6.6 11/25/2015   HGB 13.2 11/25/2015   HCT 40.3 11/25/2015   MCV 87.8 11/25/2015   PLT 232 11/25/2015   NEUTROABS 3,828 11/25/2015     Medications: I have reviewed the patient's current medications.  Assessment/Plan: 1. Colon cancer, acsending colon, status post a laparoscopic assisted right hemicolectomy 12/24/2007, stage II (T3 N0)    Disposition:  [name] remains in clinical remission from colon cancer. She has a good prognosis for a long-term disease-free survival. She last had a colonoscopy in 2015. She would like to continue follow-up at the [facility]. She will be scheduled for an office visit in one year. She can decide on the indication for additional surveillance colonoscopies with Dr. Vira Agar and her primary physician.Marland Kitchen  Betsy Coder, MD  01/16/2016  10:22 AM

## 2016-01-17 LAB — CEA (PARALLEL TESTING): CEA: 1.1 ng/mL

## 2016-01-18 ENCOUNTER — Telehealth: Payer: Self-pay | Admitting: *Deleted

## 2016-01-18 NOTE — Telephone Encounter (Signed)
-----   Message from Ladell Pier, MD sent at 01/17/2016  5:16 PM EDT ----- Please call patient , cea is normal

## 2016-01-18 NOTE — Telephone Encounter (Signed)
Pt notified of normal CEA result. She voiced appreciation for call.

## 2016-01-20 ENCOUNTER — Telehealth: Payer: Self-pay | Admitting: Oncology

## 2016-01-20 NOTE — Telephone Encounter (Signed)
CALLED PATIENT TO CONF APPT PER 08/21 LOS. L/M APPT LTR AND SCHD MAILED. °

## 2016-02-08 ENCOUNTER — Other Ambulatory Visit: Payer: Self-pay | Admitting: Family Medicine

## 2016-03-12 ENCOUNTER — Other Ambulatory Visit: Payer: Self-pay | Admitting: Family Medicine

## 2016-03-12 DIAGNOSIS — Z1231 Encounter for screening mammogram for malignant neoplasm of breast: Secondary | ICD-10-CM

## 2016-03-13 ENCOUNTER — Encounter: Payer: Self-pay | Admitting: Family Medicine

## 2016-03-13 ENCOUNTER — Ambulatory Visit (INDEPENDENT_AMBULATORY_CARE_PROVIDER_SITE_OTHER): Payer: Medicare HMO | Admitting: Family Medicine

## 2016-03-13 VITALS — BP 150/80 | HR 88 | Temp 98.2°F | Resp 16 | Ht 62.5 in | Wt 127.0 lb

## 2016-03-13 DIAGNOSIS — R309 Painful micturition, unspecified: Secondary | ICD-10-CM

## 2016-03-13 DIAGNOSIS — R1030 Lower abdominal pain, unspecified: Secondary | ICD-10-CM | POA: Diagnosis not present

## 2016-03-13 DIAGNOSIS — Z23 Encounter for immunization: Secondary | ICD-10-CM | POA: Diagnosis not present

## 2016-03-13 MED ORDER — CIPROFLOXACIN HCL 250 MG PO TABS: 250.0000 mg | ORAL_TABLET | Freq: Two times a day (BID) | ORAL | 0 refills | Status: DC

## 2016-03-13 NOTE — Progress Notes (Signed)
Subjective:    Patient ID: Joanne Gomez, female    DOB: [birth year], 82 y.o.   MRN: [id number]  HPI The patient is a pleasant 82 year old Caucasian female who is in her normal state of health until approximately 3 AM this morning. She awoke with pain and pressure in the suprapubic area. She states that the pain was moderate in intensity. She grades it as a 7 on a scale of 10.  She also reports burning with urination as well as urgency and a pressure-like feeling. The pain persisted until early morning and then resolve spontaneously. Now she is pain-free. She denies any fevers or chills. She denies any nausea vomiting or diarrhea. She denies any constipation. She denies any blood in her stool. She denies any low back pain. Her exam today is completely normal. Urinalysis is significant for trace blood and trace leukocyte esterase. Past Medical History:  Diagnosis Date  . Cancer ([place]) 11/2007   colon stage II  . Diverticula of colon   . GERD (gastroesophageal reflux disease)   . H/O colon cancer, stage II 11/15/2011  . HH (hiatus hernia)   . History of hiatal hernia   . HOH (hard of hearing)    MILD LOSS  . Hyperlipidemia   . Hypothyroidism   . Osteoporosis   . Pneumonia    Past Surgical History:  Procedure Laterality Date  . ABDOMINAL HYSTERECTOMY    . APPENDECTOMY    . CATARACT EXTRACTION W/PHACO Right 08/30/2015   Procedure: CATARACT EXTRACTION PHACO AND INTRAOCULAR LENS PLACEMENT (IOC);  Surgeon: Birder Robson, MD;  Location: [facility] ORS;  Service: Ophthalmology;  Laterality: Right;   [place]    00:40.8 AP%  15.7 CDE    6.42   fluid pack lot [id number]  exp 02/24/2017  . CATARACT EXTRACTION W/PHACO Left 09/20/2015   Procedure: CATARACT EXTRACTION PHACO AND INTRAOCULAR LENS PLACEMENT (IOC);  Surgeon: Birder Robson, MD;  Location: [facility] ORS;  Service: Ophthalmology;  Laterality: Left;  [place] 44.8 AP% 19.6 CDE 8.77 FLUID PACK LOT # [id number]  . CHOLECYSTECTOMY    . ELBOW SURGERY     right    . hemocolectomy  2009  . VAGINAL PROLAPSE REPAIR     Current Outpatient Prescriptions on File Prior to Visit  Medication Sig Dispense Refill  . ARMOUR THYROID 60 MG tablet TAKE ONE TABLET BY MOUTH EVERY MORNING BEFORE BREAKFAST (TAKE WITH 15MG  TAB EVERY DAY TO EQUAL 75MG  DOSE) 90 tablet 0  . CALCIUM CITRATE PO Take 1 tablet by mouth 2 (two) times daily. 630mg     . Cholecalciferol (VITAMIN D3) 2000 units TABS Take 1 tablet by mouth daily.    Marland Kitchen denosumab (PROLIA) 60 MG/ML SOLN injection Inject 60 mg into the skin every 6 (six) months. Administer in upper arm, thigh, or abdomen    . esomeprazole (NEXIUM) 40 MG capsule Take 1 capsule (40 mg total) by mouth daily. 30 capsule 3  . fluticasone (FLONASE) 50 MCG/ACT nasal spray 2 SPRAYS IN  EACH NOSTRIL EVERY DAY 16 g 3  . Magnesium 250 MG TABS Take 1 tablet by mouth daily.    . Methylcellulose, Laxative, (CITRUCEL) 500 MG TABS Take by mouth 2 (two) times daily.    . Multiple Vitamin (MULTIVITAMIN) tablet Take 1 tablet by mouth daily.      Marland Kitchen nystatin cream (MYCOSTATIN) Mix together with Kenalog cream    . triamcinolone cream (KENALOG) 0.1 % Mix together with Nystatin cream     No current facility-administered medications on  file prior to visit.    No Known Allergies Social History   Social History  . Marital status: Married    Spouse name: N/A  . Number of children: N/A  . Years of education: N/A   Occupational History  . Not on file.   Social History Main Topics  . Smoking status: Never Smoker  . Smokeless tobacco: Never Used  . Alcohol use Yes     Comment: occ  . Drug use: No  . Sexual activity: Yes     Comment: Married to [place], retired.   Other Topics Concern  . Not on file   Social History Narrative  . No narrative on file      Review of Systems  All other systems reviewed and are negative.      Objective:   Physical Exam  Constitutional: She appears well-developed and well-nourished.  Cardiovascular: Normal  rate, regular rhythm and normal heart sounds.   No murmur heard. Pulmonary/Chest: Effort normal and breath sounds normal. No respiratory distress. She has no wheezes. She has no rales. She exhibits no tenderness.  Abdominal: Soft. Bowel sounds are normal. She exhibits no distension and no mass. There is no tenderness. There is no rebound and no guarding.  Vitals reviewed.         Assessment & Plan:  Pain with urination - Plan: Urinalysis, Routine w reflex microscopic (not at [facility]), Urine culture  Needs flu shot - Plan: Flu Vaccine QUAD 36+ mos IM  Lower abdominal pain  Difficult to determine the etiology given the sudden onset of the pain and spontaneous resolution. Differential diagnosis includes urinary tract infection, nephrolithiasis, early diverticulitis, intestinal pain due to unknown etiology. Patient has no uterus although she still has both ovaries. She does have a history of a pelvic sling. Adhesions are also a possibility. However at the present time the patient is asymptomatic. Given the blood in her urine, the leukocyte Estrace, the pressure and burning with urination, I will treat the patient is a urinary tract infection although some of her symptoms sound like she may have passed a small kidney stone. Monitor over the next 48 hours for return of symptoms. Begin Cipro 250 mg by mouth twice a day for 5 days. I will also send a urine culture. If pain returns, proceed with imaging of the abdomen and pelvis

## 2016-03-14 LAB — URINALYSIS, ROUTINE W REFLEX MICROSCOPIC
BILIRUBIN URINE: NEGATIVE
Glucose, UA: NEGATIVE
HGB URINE DIPSTICK: NEGATIVE
KETONES UR: NEGATIVE
Leukocytes, UA: NEGATIVE
NITRITE: NEGATIVE
Protein, ur: NEGATIVE
SPECIFIC GRAVITY, URINE: 1.01 (ref 1.001–1.035)
pH: 7.5 (ref 5.0–8.0)

## 2016-03-15 LAB — URINE CULTURE: Organism ID, Bacteria: NO GROWTH

## 2016-03-19 ENCOUNTER — Other Ambulatory Visit: Payer: Self-pay | Admitting: Family Medicine

## 2016-03-19 DIAGNOSIS — Z1231 Encounter for screening mammogram for malignant neoplasm of breast: Secondary | ICD-10-CM

## 2016-03-30 ENCOUNTER — Ambulatory Visit
Admission: RE | Admit: 2016-03-30 | Discharge: 2016-03-30 | Disposition: A | Discharge status: Home / Self Care | Payer: Medicare HMO | Source: Ambulatory Visit | Attending: Family Medicine | Admitting: Family Medicine

## 2016-03-30 DIAGNOSIS — Z1231 Encounter for screening mammogram for malignant neoplasm of breast: Secondary | ICD-10-CM

## 2016-05-09 ENCOUNTER — Other Ambulatory Visit: Payer: Self-pay | Admitting: Family Medicine

## 2016-05-14 ENCOUNTER — Telehealth: Payer: Self-pay | Admitting: Family Medicine

## 2016-05-14 NOTE — Telephone Encounter (Signed)
PA submitted for Armour Thyroid through [email or web address] and could take up to 5 business days to get a reply.   Contact plan to follow up on [id number]

## 2016-05-15 MED ORDER — THYROID 60 MG PO TABS: ORAL_TABLET | ORAL | 1 refills | Status: DC

## 2016-05-15 NOTE — Telephone Encounter (Signed)
Approved through 05/27/16 - pharmacy aware

## 2016-07-05 ENCOUNTER — Other Ambulatory Visit: Payer: PPO

## 2016-07-05 ENCOUNTER — Other Ambulatory Visit: Payer: Self-pay | Admitting: Family Medicine

## 2016-07-05 DIAGNOSIS — Z79899 Other long term (current) drug therapy: Secondary | ICD-10-CM

## 2016-07-05 DIAGNOSIS — Z0000 Encounter for general adult medical examination without abnormal findings: Secondary | ICD-10-CM | POA: Diagnosis not present

## 2016-07-05 DIAGNOSIS — I10 Essential (primary) hypertension: Secondary | ICD-10-CM | POA: Diagnosis not present

## 2016-07-05 DIAGNOSIS — M810 Age-related osteoporosis without current pathological fracture: Secondary | ICD-10-CM

## 2016-07-05 DIAGNOSIS — E039 Hypothyroidism, unspecified: Secondary | ICD-10-CM

## 2016-07-05 LAB — COMPLETE METABOLIC PANEL WITH GFR
ALBUMIN: 4.3 g/dL (ref 3.6–5.1)
ALT: 11 U/L (ref 6–29)
AST: 18 U/L (ref 10–35)
Alkaline Phosphatase: 65 U/L (ref 33–130)
BUN: 10 mg/dL (ref 7–25)
CALCIUM: 9.8 mg/dL (ref 8.6–10.4)
CHLORIDE: 103 mmol/L (ref 98–110)
CO2: 29 mmol/L (ref 20–31)
CREATININE: 0.7 mg/dL (ref 0.60–0.88)
GFR, Est African American: >89 mL/min (ref >=60)
GFR, Est Non African American: 80 mL/min (ref >=60)
GLUCOSE: 85 mg/dL (ref 70–99)
POTASSIUM: 4.8 mmol/L (ref 3.5–5.3)
SODIUM: 141 mmol/L (ref 135–146)
Total Bilirubin: 0.4 mg/dL (ref 0.2–1.2)
Total Protein: 7.5 g/dL (ref 6.1–8.1)

## 2016-07-05 LAB — CBC WITH DIFFERENTIAL/PLATELET
BASOS PCT: 0 %
Basophils Absolute: 0 cells/uL (ref 0–200)
Eosinophils Absolute: 104 cells/uL (ref 15–500)
Eosinophils Relative: 1 %
HCT: 43.4 % (ref 35.0–45.0)
Hemoglobin: 14.1 g/dL (ref 12.0–15.0)
LYMPHS PCT: 18 %
Lymphs Abs: 1872 cells/uL (ref 850–3900)
MCH: 28.6 pg (ref 27.0–33.0)
MCHC: 32.5 g/dL (ref 32.0–36.0)
MCV: 88 fL (ref 80.0–100.0)
MONO ABS: 520 {cells}/uL (ref 200–950)
MONOS PCT: 5 %
MPV: 10.1 fL (ref 7.5–12.5)
NEUTROS PCT: 76 %
Neutro Abs: 7904 cells/uL — ABNORMAL HIGH (ref 1500–7800)
PLATELETS: 246 10*3/uL (ref 140–400)
RBC: 4.93 MIL/uL (ref 3.80–5.10)
RDW: 13.8 % (ref 11.0–15.0)
WBC: 10.4 10*3/uL (ref 3.8–10.8)

## 2016-07-05 LAB — LIPID PANEL
CHOL/HDL RATIO: 3 ratio (ref <5.0)
Cholesterol: 219 mg/dL — ABNORMAL HIGH (ref <200)
HDL: 73 mg/dL (ref >50)
LDL Cholesterol: 128 mg/dL — ABNORMAL HIGH (ref <100)
Triglycerides: 92 mg/dL (ref <150)
VLDL: 18 mg/dL (ref <30)

## 2016-07-05 LAB — TSH: TSH: 3.48 m[IU]/L

## 2016-07-05 MED ORDER — CYCLOBENZAPRINE HCL 5 MG PO TABS: 5.0000 mg | ORAL_TABLET | Freq: Three times a day (TID) | ORAL | 1 refills | Status: DC | PRN

## 2016-07-06 LAB — VITAMIN D 25 HYDROXY (VIT D DEFICIENCY, FRACTURES): VIT D 25 HYDROXY: 44 ng/mL (ref 30–100)

## 2016-07-09 ENCOUNTER — Encounter: Payer: Self-pay | Admitting: Family Medicine

## 2016-07-09 ENCOUNTER — Ambulatory Visit: Payer: PPO

## 2016-07-09 ENCOUNTER — Telehealth: Payer: Self-pay | Admitting: Family Medicine

## 2016-07-09 ENCOUNTER — Ambulatory Visit (INDEPENDENT_AMBULATORY_CARE_PROVIDER_SITE_OTHER): Payer: PPO | Admitting: Family Medicine

## 2016-07-09 VITALS — BP 160/84 | HR 82 | Temp 97.6°F | Resp 14 | Ht 62.5 in | Wt 127.0 lb

## 2016-07-09 DIAGNOSIS — M810 Age-related osteoporosis without current pathological fracture: Secondary | ICD-10-CM

## 2016-07-09 DIAGNOSIS — Z0000 Encounter for general adult medical examination without abnormal findings: Secondary | ICD-10-CM | POA: Diagnosis not present

## 2016-07-09 DIAGNOSIS — E038 Other specified hypothyroidism: Secondary | ICD-10-CM

## 2016-07-09 DIAGNOSIS — E7800 Pure hypercholesterolemia, unspecified: Secondary | ICD-10-CM | POA: Diagnosis not present

## 2016-07-09 MED ORDER — DENOSUMAB 60 MG/ML ~~LOC~~ SOLN
60.0000 mg | SUBCUTANEOUS | Status: DC
  Administered 2016-07-09 – 2018-08-13 (×4): 60 mg via SUBCUTANEOUS

## 2016-07-09 NOTE — Telephone Encounter (Signed)
Pt aware.

## 2016-07-09 NOTE — Addendum Note (Signed)
Addended by: Olena Mater on: 07/09/2016 10:57 AM   Modules accepted: Orders

## 2016-07-09 NOTE — Telephone Encounter (Signed)
Pt wants to know if her and her husband needs follow up appointment for todays visit or not.

## 2016-07-09 NOTE — Telephone Encounter (Signed)
In 6 months

## 2016-07-09 NOTE — Progress Notes (Signed)
Subjective:    Patient ID: Joanne Gomez, female    DOB: 02/24/1931, 83 y.o.   MRN: [id number]  HPI  Patient is here today for complete physical exam. Patient had a colonoscopy performed 2015 which was significant for a polyp. No regular surveillance was scheduled due to age. She had a mammogram as well as a bone density test in 2016. She does have osteoporosis for which she has been on Prolia 4 years and her T score improved on her last bone density test.  Patient's immunizations are all up-to-date.  Immunization History  Administered Date(s) Administered  . Influenza,inj,Quad PF,36+ Mos 03/10/2013, 03/08/2014, 03/17/2015, 03/13/2016  . Pneumococcal Conjugate-13 05/11/2013  . Pneumococcal Polysaccharide-23 05/31/2014  . Td 02/25/1996  . Tdap 05/03/2011  . Zoster 02/23/2011    Lab on 07/05/2016  Component Date Value Ref Range Status  . Sodium 07/05/2016 141  135 - 146 mmol/L Final  . Potassium 07/05/2016 4.8  3.5 - 5.3 mmol/L Final  . Chloride 07/05/2016 103  98 - 110 mmol/L Final  . CO2 07/05/2016 29  20 - 31 mmol/L Final  . Glucose, Bld 07/05/2016 85  70 - 99 mg/dL Final  . BUN 07/05/2016 10  7 - 25 mg/dL Final  . Creat 07/05/2016 0.70  0.60 - 0.88 mg/dL Final   Comment:   For patients > or = 81 years of age: The upper reference limit for Creatinine is approximately 13% higher for people identified as African-American.     . Total Bilirubin 07/05/2016 0.4  0.2 - 1.2 mg/dL Final  . Alkaline Phosphatase 07/05/2016 65  33 - 130 U/L Final  . AST 07/05/2016 18  10 - 35 U/L Final  . ALT 07/05/2016 11  6 - 29 U/L Final  . Total Protein 07/05/2016 7.5  6.1 - 8.1 g/dL Final  . Albumin 07/05/2016 4.3  3.6 - 5.1 g/dL Final  . Calcium 07/05/2016 9.8  8.6 - 10.4 mg/dL Final  . GFR, Est African American 07/05/2016 >89  >=60 mL/min Final  . GFR, Est Non African American 07/05/2016 80  >=60 mL/min Final  . TSH 07/05/2016 3.48  mIU/L Final   Comment:   Reference Range   > or = 20  Years  0.40-4.50   Pregnancy Range First trimester  0.26-2.66 Second trimester 0.55-2.73 Third trimester  0.43-2.91     . Cholesterol 07/05/2016 219* <200 mg/dL Final  . Triglycerides 07/05/2016 92  <150 mg/dL Final  . HDL 07/05/2016 73  >50 mg/dL Final  . Total CHOL/HDL Ratio 07/05/2016 3.0  <5.0 Ratio Final  . VLDL 07/05/2016 18  <30 mg/dL Final  . LDL Cholesterol 07/05/2016 128* <100 mg/dL Final  . WBC 07/05/2016 10.4  3.8 - 10.8 K/uL Final  . RBC 07/05/2016 4.93  3.80 - 5.10 MIL/uL Final  . Hemoglobin 07/05/2016 14.1  12.0 - 15.0 g/dL Final  . HCT 07/05/2016 43.4  35.0 - 45.0 % Final  . MCV 07/05/2016 88.0  80.0 - 100.0 fL Final  . MCH 07/05/2016 28.6  27.0 - 33.0 pg Final  . MCHC 07/05/2016 32.5  32.0 - 36.0 g/dL Final  . RDW 07/05/2016 13.8  11.0 - 15.0 % Final  . Platelets 07/05/2016 246  140 - 400 K/uL Final  . MPV 07/05/2016 10.1  7.5 - 12.5 fL Final  . Neutro Abs 07/05/2016 7904* 1,500 - 7,800 cells/uL Final  . Lymphs Abs 07/05/2016 1872  850 - 3,900 cells/uL Final  . Monocytes Absolute 07/05/2016 520  200 - 950 cells/uL Final  . Eosinophils Absolute 07/05/2016 104  15 - 500 cells/uL Final  . Basophils Absolute 07/05/2016 0  0 - 200 cells/uL Final  . Neutrophils Relative % 07/05/2016 76  % Final  . Lymphocytes Relative 07/05/2016 18  % Final  . Monocytes Relative 07/05/2016 5  % Final  . Eosinophils Relative 07/05/2016 1  % Final  . Basophils Relative 07/05/2016 0  % Final  . Smear Review 07/05/2016 Criteria for review not met   Final  . Vit D, 25-Hydroxy 07/05/2016 44  30 - 100 ng/mL Final   Comment: Vitamin D Status           25-OH Vitamin D        Deficiency                <20 ng/mL        Insufficiency         20 - 29 ng/mL        Optimal             > or = 30 ng/mL   For 25-OH Vitamin D testing on patients on D2-supplementation and patients for whom quantitation of D2 and D3 fractions is required, the QuestAssureD 25-OH VIT D, (D2,D3), LC/MS/MS is  recommended: order code [id number] (patients > 2 yrs).     Past Medical History:  Diagnosis Date  . Cancer ([place]) 11/2007   colon stage II  . Diverticula of colon   . GERD (gastroesophageal reflux disease)   . H/O colon cancer, stage II 11/15/2011  . HH (hiatus hernia)   . History of hiatal hernia   . HOH (hard of hearing)    MILD LOSS  . Hyperlipidemia   . Hypothyroidism   . Osteoporosis   . Pneumonia    Past Surgical History:  Procedure Laterality Date  . ABDOMINAL HYSTERECTOMY    . APPENDECTOMY    . CATARACT EXTRACTION W/PHACO Right 08/30/2015   Procedure: CATARACT EXTRACTION PHACO AND INTRAOCULAR LENS PLACEMENT (IOC);  Surgeon: Birder Robson, MD;  Location: [facility] ORS;  Service: Ophthalmology;  Laterality: Right;   [place]    00:40.8 AP%  15.7 CDE    6.42   fluid pack lot #[id number]  exp 02/24/2017  . CATARACT EXTRACTION W/PHACO Left 09/20/2015   Procedure: CATARACT EXTRACTION PHACO AND INTRAOCULAR LENS PLACEMENT (IOC);  Surgeon: Birder Robson, MD;  Location: [facility] ORS;  Service: Ophthalmology;  Laterality: Left;  [place] 44.8 AP% 19.6 CDE 8.77 FLUID PACK LOT # [id number]  . CHOLECYSTECTOMY    . ELBOW SURGERY     right   . hemocolectomy  2009  . VAGINAL PROLAPSE REPAIR     Current Outpatient Prescriptions on File Prior to Visit  Medication Sig Dispense Refill  . CALCIUM CITRATE PO Take 1 tablet by mouth 2 (two) times daily. 610m    . Cholecalciferol (VITAMIN D3) 2000 units TABS Take 1 tablet by mouth daily.    . cyclobenzaprine (FLEXERIL) 5 MG tablet Take 1 tablet (5 mg total) by mouth 3 (three) times daily as needed for muscle spasms. 30 tablet 1  . denosumab (PROLIA) 60 MG/ML SOLN injection Inject 60 mg into the skin every 6 (six) months. Administer in upper arm, thigh, or abdomen    . esomeprazole (NEXIUM) 40 MG capsule Take 1 capsule (40 mg total) by mouth daily. 30 capsule 3  . fluticasone (FLONASE) 50 MCG/ACT nasal spray 2 SPRAYS IN  EACH NOSTRIL EVERY DAY 16 g 3  .  Magnesium  250 MG TABS Take 1 tablet by mouth daily.    . Methylcellulose, Laxative, (CITRUCEL) 500 MG TABS Take by mouth 2 (two) times daily.    . Multiple Vitamin (MULTIVITAMIN) tablet Take 1 tablet by mouth daily.      Marland Kitchen nystatin cream (MYCOSTATIN) Mix together with Kenalog cream    . thyroid (ARMOUR THYROID) 60 MG tablet TAKE ONE TABLET EVERY MORNING BEFORE BREAKFAST 90 tablet 1  . triamcinolone cream (KENALOG) 0.1 % Mix together with Nystatin cream     No current facility-administered medications on file prior to visit.    No Known Allergies Social History   Social History  . Marital status: Married    Spouse name: N/A  . Number of children: N/A  . Years of education: N/A   Occupational History  . Not on file.   Social History Main Topics  . Smoking status: Never Smoker  . Smokeless tobacco: Never Used  . Alcohol use Yes     Comment: occ  . Drug use: No  . Sexual activity: Yes     Comment: Married to [place], retired.   Other Topics Concern  . Not on file   Social History Narrative  . No narrative on file   Family History  Problem Relation Age of Onset  . Heart attack Sister 32  . Hyperlipidemia Sister   . Hypertension Sister      Review of Systems  All other systems reviewed and are negative.      Objective:   Physical Exam  Constitutional: She is oriented to person, place, and time. She appears well-developed and well-nourished. No distress.  HENT:  Head: Normocephalic and atraumatic.  Right Ear: External ear normal.  Left Ear: External ear normal.  Nose: Nose normal.  Mouth/Throat: Oropharynx is clear and moist. No oropharyngeal exudate.  Eyes: Conjunctivae and EOM are normal. Pupils are equal, round, and reactive to light. Left eye exhibits no discharge. No scleral icterus.  Neck: Normal range of motion. Neck supple. No JVD present. No tracheal deviation present. No thyromegaly present.  Cardiovascular: Normal rate, regular rhythm, normal heart sounds and intact  distal pulses.  Exam reveals no gallop and no friction rub.   No murmur heard. Pulmonary/Chest: Effort normal and breath sounds normal. No stridor. No respiratory distress. She has no wheezes. She has no rales. She exhibits no tenderness.  Abdominal: Soft. Bowel sounds are normal. She exhibits no distension and no mass. There is no tenderness. There is no rebound and no guarding.  Musculoskeletal: Normal range of motion. She exhibits no edema or tenderness.  Lymphadenopathy:    She has no cervical adenopathy.  Neurological: She is alert and oriented to person, place, and time. She has normal reflexes. No cranial nerve deficit. She exhibits normal muscle tone. Coordination normal.  Skin: Skin is warm. No rash noted. No erythema. No pallor.  Psychiatric: She has a normal mood and affect. Her behavior is normal. Judgment and thought content normal.  Vitals reviewed.         Assessment & Plan:  Routine general medical examination at a health care facility  Pure hypercholesterolemia  Osteoporosis without current pathological fracture, unspecified osteoporosis type  Other specified hypothyroidism  Patient's physical exam is completely normal.  Her cancer screening is up-to-date. Regular anticipatory guidance is provided. I will make no changes in her medication at this time. Her lab work is excellent.  However I'm concerned about her blood pressure. She will check her blood pressure  everyday at home for the next week and notify me of the values. At the present time her TSH is within therapeutic limits and therefore she does not want to change away from Armour Thyroid. Immunizations are up-to-date.

## 2016-07-13 DIAGNOSIS — H02056 Trichiasis without entropian left eye, unspecified eyelid: Secondary | ICD-10-CM | POA: Diagnosis not present

## 2016-09-24 DIAGNOSIS — H6060 Unspecified chronic otitis externa, unspecified ear: Secondary | ICD-10-CM | POA: Diagnosis not present

## 2016-10-17 DIAGNOSIS — H02056 Trichiasis without entropian left eye, unspecified eyelid: Secondary | ICD-10-CM | POA: Diagnosis not present

## 2016-11-05 ENCOUNTER — Other Ambulatory Visit: Payer: Self-pay | Admitting: Family Medicine

## 2016-11-21 DIAGNOSIS — L661 Lichen planopilaris: Secondary | ICD-10-CM | POA: Diagnosis not present

## 2016-11-21 DIAGNOSIS — Z85828 Personal history of other malignant neoplasm of skin: Secondary | ICD-10-CM | POA: Diagnosis not present

## 2016-11-21 DIAGNOSIS — L820 Inflamed seborrheic keratosis: Secondary | ICD-10-CM | POA: Diagnosis not present

## 2016-11-21 DIAGNOSIS — Z01419 Encounter for gynecological examination (general) (routine) without abnormal findings: Secondary | ICD-10-CM | POA: Diagnosis not present

## 2016-11-21 DIAGNOSIS — Z6822 Body mass index (BMI) 22.0-22.9, adult: Secondary | ICD-10-CM | POA: Diagnosis not present

## 2016-11-21 DIAGNOSIS — L570 Actinic keratosis: Secondary | ICD-10-CM | POA: Diagnosis not present

## 2016-11-21 DIAGNOSIS — X32XXXA Exposure to sunlight, initial encounter: Secondary | ICD-10-CM | POA: Diagnosis not present

## 2016-11-21 DIAGNOSIS — D2262 Melanocytic nevi of left upper limb, including shoulder: Secondary | ICD-10-CM | POA: Diagnosis not present

## 2017-01-08 ENCOUNTER — Other Ambulatory Visit: Payer: Self-pay | Admitting: Family Medicine

## 2017-01-08 ENCOUNTER — Other Ambulatory Visit: Payer: PPO

## 2017-01-08 ENCOUNTER — Telehealth: Payer: Self-pay | Admitting: Family Medicine

## 2017-01-08 DIAGNOSIS — M810 Age-related osteoporosis without current pathological fracture: Secondary | ICD-10-CM | POA: Diagnosis not present

## 2017-01-08 DIAGNOSIS — E039 Hypothyroidism, unspecified: Secondary | ICD-10-CM

## 2017-01-08 DIAGNOSIS — Z79899 Other long term (current) drug therapy: Secondary | ICD-10-CM | POA: Diagnosis not present

## 2017-01-08 DIAGNOSIS — I10 Essential (primary) hypertension: Secondary | ICD-10-CM | POA: Diagnosis not present

## 2017-01-08 LAB — CBC WITH DIFFERENTIAL/PLATELET
BASOS ABS: 0 {cells}/uL (ref 0–200)
Basophils Relative: 0 %
EOS ABS: 130 {cells}/uL (ref 15–500)
Eosinophils Relative: 2 %
HCT: 42.7 % (ref 35.0–45.0)
Hemoglobin: 13.8 g/dL (ref 12.0–15.0)
LYMPHS PCT: 29 %
Lymphs Abs: 1885 cells/uL (ref 850–3900)
MCH: 28.8 pg (ref 27.0–33.0)
MCHC: 32.3 g/dL (ref 32.0–36.0)
MCV: 89 fL (ref 80.0–100.0)
MONOS PCT: 7 %
MPV: 10.1 fL (ref 7.5–12.5)
Monocytes Absolute: 455 cells/uL (ref 200–950)
NEUTROS PCT: 62 %
Neutro Abs: 4030 cells/uL (ref 1500–7800)
PLATELETS: 256 10*3/uL (ref 140–400)
RBC: 4.8 MIL/uL (ref 3.80–5.10)
RDW: 14.1 % (ref 11.0–15.0)
WBC: 6.5 10*3/uL (ref 3.8–10.8)

## 2017-01-08 LAB — TSH: TSH: 4.35 m[IU]/L

## 2017-01-08 NOTE — Telephone Encounter (Signed)
Pt needs to know when it is time for her next prolia

## 2017-01-09 LAB — LIPID PANEL
CHOL/HDL RATIO: 3.3 ratio (ref <5.0)
Cholesterol: 229 mg/dL — ABNORMAL HIGH (ref <200)
HDL: 70 mg/dL (ref >50)
LDL Cholesterol: 142 mg/dL — ABNORMAL HIGH (ref <100)
Triglycerides: 85 mg/dL (ref <150)
VLDL: 17 mg/dL (ref <30)

## 2017-01-09 LAB — COMPLETE METABOLIC PANEL WITH GFR
ALT: 11 U/L (ref 6–29)
AST: 17 U/L (ref 10–35)
Albumin: 4.3 g/dL (ref 3.6–5.1)
Alkaline Phosphatase: 58 U/L (ref 33–130)
BUN: 9 mg/dL (ref 7–25)
CALCIUM: 9.5 mg/dL (ref 8.6–10.4)
CHLORIDE: 104 mmol/L (ref 98–110)
CO2: 24 mmol/L (ref 20–32)
Creat: 0.65 mg/dL (ref 0.60–0.88)
GFR, EST NON AFRICAN AMERICAN: 82 mL/min (ref >=60)
GFR, Est African American: >89 mL/min (ref >=60)
Glucose, Bld: 90 mg/dL (ref 70–99)
POTASSIUM: 4.8 mmol/L (ref 3.5–5.3)
Sodium: 143 mmol/L (ref 135–146)
Total Bilirubin: 0.5 mg/dL (ref 0.2–1.2)
Total Protein: 7.2 g/dL (ref 6.1–8.1)

## 2017-01-09 LAB — VITAMIN D 25 HYDROXY (VIT D DEFICIENCY, FRACTURES): VIT D 25 HYDROXY: 36 ng/mL (ref 30–100)

## 2017-01-10 ENCOUNTER — Ambulatory Visit (INDEPENDENT_AMBULATORY_CARE_PROVIDER_SITE_OTHER): Payer: PPO | Admitting: Family Medicine

## 2017-01-10 ENCOUNTER — Encounter: Payer: Self-pay | Admitting: Family Medicine

## 2017-01-10 ENCOUNTER — Telehealth: Payer: Self-pay | Admitting: Family Medicine

## 2017-01-10 VITALS — BP 136/80 | HR 68 | Temp 98.2°F | Resp 16 | Ht 62.5 in | Wt 128.0 lb

## 2017-01-10 DIAGNOSIS — E7800 Pure hypercholesterolemia, unspecified: Secondary | ICD-10-CM | POA: Diagnosis not present

## 2017-01-10 DIAGNOSIS — I10 Essential (primary) hypertension: Secondary | ICD-10-CM | POA: Diagnosis not present

## 2017-01-10 DIAGNOSIS — E038 Other specified hypothyroidism: Secondary | ICD-10-CM

## 2017-01-10 NOTE — Progress Notes (Signed)
Subjective:    Patient ID: Joanne Gomez, female    DOB: 09-03-1930, 83 y.o.   MRN: [id number]  HPI  Patient is here today for follow up of hypothyroidism and hypertension.  Her cholesterol is borderline elevated. However given her advanced age with weighted risk and benefits of putting her on statin medication versus the potential side effects. Together we have decided not to treat her mild elevation in cholesterol. Her blood pressure today is much better. She denies any chest pain shortness of breath or dyspnea on exertion. Her most recent lab work is listed below  Appointment on 01/08/2017  Component Date Value Ref Range Status  . Vit D, 25-Hydroxy 01/08/2017 36  30 - 100 ng/mL Final   Comment: Vitamin D Status           25-OH Vitamin D        Deficiency                <20 ng/mL        Insufficiency         20 - 29 ng/mL        Optimal             > or = 30 ng/mL   For 25-OH Vitamin D testing on patients on D2-supplementation and patients for whom quantitation of D2 and D3 fractions is required, the [facility] 25-OH VIT D, (D2,D3), LC/MS/MS is recommended: order code [phone] (patients > 2 yrs).   . WBC 01/08/2017 6.5  3.8 - 10.8 K/uL Final  . RBC 01/08/2017 4.80  3.80 - 5.10 MIL/uL Final  . Hemoglobin 01/08/2017 13.8  12.0 - 15.0 g/dL Final  . HCT 01/08/2017 42.7  35.0 - 45.0 % Final  . MCV 01/08/2017 89.0  80.0 - 100.0 fL Final  . MCH 01/08/2017 28.8  27.0 - 33.0 pg Final  . MCHC 01/08/2017 32.3  32.0 - 36.0 g/dL Final  . RDW 01/08/2017 14.1  11.0 - 15.0 % Final  . Platelets 01/08/2017 256  140 - 400 K/uL Final  . MPV 01/08/2017 10.1  7.5 - 12.5 fL Final  . Neutro Abs 01/08/2017 4030  1,500 - 7,800 cells/uL Final  . Lymphs Abs 01/08/2017 1885  850 - 3,900 cells/uL Final  . Monocytes Absolute 01/08/2017 455  200 - 950 cells/uL Final  . Eosinophils Absolute 01/08/2017 130  15 - 500 cells/uL Final  . Basophils Absolute 01/08/2017 0  0 - 200 cells/uL Final  . Neutrophils  Relative % 01/08/2017 62  % Final  . Lymphocytes Relative 01/08/2017 29  % Final  . Monocytes Relative 01/08/2017 7  % Final  . Eosinophils Relative 01/08/2017 2  % Final  . Basophils Relative 01/08/2017 0  % Final  . Smear Review 01/08/2017 Criteria for review not met   Final  . Cholesterol 01/08/2017 229* <200 mg/dL Final  . Triglycerides 01/08/2017 85  <150 mg/dL Final  . HDL 01/08/2017 70  >50 mg/dL Final  . Total CHOL/HDL Ratio 01/08/2017 3.3  <5.0 Ratio Final  . VLDL 01/08/2017 17  <30 mg/dL Final  . LDL Cholesterol 01/08/2017 142* <100 mg/dL Final  . TSH 01/08/2017 4.35  mIU/L Final   Comment:   Reference Range   > or = 20 Years  0.40-4.50   Pregnancy Range First trimester  0.26-2.66 Second trimester 0.55-2.73 Third trimester  0.43-2.91     . Sodium 01/08/2017 143  135 - 146 mmol/L Final  . Potassium 01/08/2017 4.8  3.5 -  5.3 mmol/L Final  . Chloride 01/08/2017 104  98 - 110 mmol/L Final  . CO2 01/08/2017 24  20 - 32 mmol/L Final   Comment: ** Please note change in reference range(s). **     . Glucose, Bld 01/08/2017 90  70 - 99 mg/dL Final  . BUN 01/08/2017 9  7 - 25 mg/dL Final  . Creat 01/08/2017 0.65  0.60 - 0.88 mg/dL Final   Comment:   For patients > or = 81 years of age: The upper reference limit for Creatinine is approximately 13% higher for people identified as African-American.     . Total Bilirubin 01/08/2017 0.5  0.2 - 1.2 mg/dL Final  . Alkaline Phosphatase 01/08/2017 58  33 - 130 U/L Final  . AST 01/08/2017 17  10 - 35 U/L Final  . ALT 01/08/2017 11  6 - 29 U/L Final  . Total Protein 01/08/2017 7.2  6.1 - 8.1 g/dL Final  . Albumin 01/08/2017 4.3  3.6 - 5.1 g/dL Final  . Calcium 01/08/2017 9.5  8.6 - 10.4 mg/dL Final  . GFR, Est African American 01/08/2017 >89  >=60 mL/min Final  . GFR, Est Non African American 01/08/2017 82  >=60 mL/min Final    Past Medical History:  Diagnosis Date  . Cancer ([place]) 11/2007   colon stage II  . Diverticula of  colon   . GERD (gastroesophageal reflux disease)   . H/O colon cancer, stage II 11/15/2011  . HH (hiatus hernia)   . History of hiatal hernia   . HOH (hard of hearing)    MILD LOSS  . Hyperlipidemia   . Hypothyroidism   . Osteoporosis   . Pneumonia    Past Surgical History:  Procedure Laterality Date  . ABDOMINAL HYSTERECTOMY    . APPENDECTOMY    . CATARACT EXTRACTION W/PHACO Right 08/30/2015   Procedure: CATARACT EXTRACTION PHACO AND INTRAOCULAR LENS PLACEMENT (IOC);  Surgeon: Birder Robson, MD;  Location: [facility] ORS;  Service: Ophthalmology;  Laterality: Right;   [place]    00:40.8 AP%  15.7 CDE    6.42   fluid pack lot #[id number]  exp 02/24/2017  . CATARACT EXTRACTION W/PHACO Left 09/20/2015   Procedure: CATARACT EXTRACTION PHACO AND INTRAOCULAR LENS PLACEMENT (IOC);  Surgeon: Birder Robson, MD;  Location: [facility] ORS;  Service: Ophthalmology;  Laterality: Left;  [place] 44.8 AP% 19.6 CDE 8.77 FLUID PACK LOT # [id number]  . CHOLECYSTECTOMY    . ELBOW SURGERY     right   . hemocolectomy  2009  . VAGINAL PROLAPSE REPAIR     Current Outpatient Prescriptions on File Prior to Visit  Medication Sig Dispense Refill  . CALCIUM CITRATE PO Take 1 tablet by mouth 2 (two) times daily. 635m    . Cholecalciferol (VITAMIN D3) 2000 units TABS Take 1 tablet by mouth daily.    . cyclobenzaprine (FLEXERIL) 5 MG tablet Take 1 tablet (5 mg total) by mouth 3 (three) times daily as needed for muscle spasms. 30 tablet 1  . denosumab (PROLIA) 60 MG/ML SOLN injection Inject 60 mg into the skin every 6 (six) months. Administer in upper arm, thigh, or abdomen    . esomeprazole (NEXIUM) 40 MG capsule TAKE 1 CAPSULE BY MOUTH EVERY DAY 90 capsule 4  . fluticasone (FLONASE) 50 MCG/ACT nasal spray 2 SPRAYS IN  EACH NOSTRIL EVERY DAY 16 g 3  . Magnesium 250 MG TABS Take 1 tablet by mouth daily.    . Methylcellulose, Laxative, (CITRUCEL) 500 MG TABS  Take by mouth 2 (two) times daily.    . Multiple Vitamin (MULTIVITAMIN)  tablet Take 1 tablet by mouth daily.      Marland Kitchen nystatin cream (MYCOSTATIN) Mix together with Kenalog cream    . thyroid (ARMOUR THYROID) 60 MG tablet TAKE ONE TABLET EVERY MORNING BEFORE BREAKFAST 90 tablet 1  . triamcinolone cream (KENALOG) 0.1 % Mix together with Nystatin cream     Current Facility-Administered Medications on File Prior to Visit  Medication Dose Route Frequency Provider Last Rate Last Dose  . denosumab (PROLIA) injection 60 mg  60 mg Subcutaneous Q6 months Susy Frizzle, MD   60 mg at 07/09/16 1052   No Known Allergies Social History   Social History  . Marital status: Married    Spouse name: N/A  . Number of children: N/A  . Years of education: N/A   Occupational History  . Not on file.   Social History Main Topics  . Smoking status: Never Smoker  . Smokeless tobacco: Never Used  . Alcohol use Yes     Comment: occ  . Drug use: No  . Sexual activity: Yes     Comment: Married to [place], retired.   Other Topics Concern  . Not on file   Social History Narrative  . No narrative on file   Family History  Problem Relation Age of Onset  . Heart attack Sister 37  . Hyperlipidemia Sister   . Hypertension Sister      Review of Systems  All other systems reviewed and are negative.      Objective:   Physical Exam  Constitutional: She is oriented to person, place, and time. She appears well-developed and well-nourished. No distress.  HENT:  Head: Normocephalic and atraumatic.  Right Ear: External ear normal.  Left Ear: External ear normal.  Nose: Nose normal.  Mouth/Throat: Oropharynx is clear and moist. No oropharyngeal exudate.  Eyes: Pupils are equal, round, and reactive to light. Conjunctivae and EOM are normal. Left eye exhibits no discharge. No scleral icterus.  Neck: Normal range of motion. Neck supple. No JVD present. No tracheal deviation present. No thyromegaly present.  Cardiovascular: Normal rate, regular rhythm, normal heart sounds and  intact distal pulses.  Exam reveals no gallop and no friction rub.   No murmur heard. Pulmonary/Chest: Effort normal and breath sounds normal. No stridor. No respiratory distress. She has no wheezes. She has no rales. She exhibits no tenderness.  Abdominal: Soft. Bowel sounds are normal. She exhibits no distension and no mass. There is no tenderness. There is no rebound and no guarding.  Musculoskeletal: Normal range of motion. She exhibits no edema or tenderness.  Lymphadenopathy:    She has no cervical adenopathy.  Neurological: She is alert and oriented to person, place, and time. She has normal reflexes. No cranial nerve deficit. She exhibits normal muscle tone. Coordination normal.  Skin: Skin is warm. No rash noted. No erythema. No pallor.  Psychiatric: She has a normal mood and affect. Her behavior is normal. Judgment and thought content normal.  Vitals reviewed.         Assessment & Plan:  Other specified hypothyroidism  Pure hypercholesterolemia  Essential hypertension  Blood pressure is excellent. Lab work is excellent aside from the mild elevation in her LDL cholesterol. We will make no changes in her medication at this time. She will continue to monitor blood pressure. If consistently greater than 329 systolic, I would start the patient on additional medication. However at  the present time given her advanced age I believe 130s to 140s are adequate

## 2017-01-10 NOTE — Telephone Encounter (Signed)
Pharm changed

## 2017-01-10 NOTE — Telephone Encounter (Signed)
New Message  Pt voiced the pharmacy should be [facility] in [place] instead of [place].

## 2017-01-11 NOTE — Telephone Encounter (Signed)
Pt is now due for next injection.  I left her a phone message to advise.  Let us know when coming so we can order.

## 2017-01-14 ENCOUNTER — Telehealth: Payer: Self-pay | Admitting: Family Medicine

## 2017-01-14 DIAGNOSIS — Z1231 Encounter for screening mammogram for malignant neoplasm of breast: Secondary | ICD-10-CM

## 2017-01-14 DIAGNOSIS — Z79899 Other long term (current) drug therapy: Secondary | ICD-10-CM

## 2017-01-14 DIAGNOSIS — M810 Age-related osteoporosis without current pathological fracture: Secondary | ICD-10-CM

## 2017-01-14 NOTE — Telephone Encounter (Signed)
Wants to know when next Prolia shot due.  Is due now, will order and let pt know when here.  Also wants [place] to put in for her follow up Dexa Scan and yearly mammogram [facility].  Will put order in and told pt she can call and schedule for her convenience.

## 2017-01-15 ENCOUNTER — Ambulatory Visit: Payer: Medicare Other | Admitting: Oncology

## 2017-01-26 ENCOUNTER — Other Ambulatory Visit: Payer: Self-pay | Admitting: Family Medicine

## 2017-01-30 ENCOUNTER — Ambulatory Visit (INDEPENDENT_AMBULATORY_CARE_PROVIDER_SITE_OTHER): Payer: PPO | Admitting: *Deleted

## 2017-01-30 DIAGNOSIS — M810 Age-related osteoporosis without current pathological fracture: Secondary | ICD-10-CM | POA: Diagnosis not present

## 2017-01-30 NOTE — Progress Notes (Signed)
Patient seen in office for Prolia Injection.   Tolerated IM administration well.

## 2017-03-01 ENCOUNTER — Ambulatory Visit (INDEPENDENT_AMBULATORY_CARE_PROVIDER_SITE_OTHER): Payer: PPO | Admitting: *Deleted

## 2017-03-01 DIAGNOSIS — Z23 Encounter for immunization: Secondary | ICD-10-CM | POA: Diagnosis not present

## 2017-04-03 ENCOUNTER — Ambulatory Visit
Admission: RE | Admit: 2017-04-03 | Discharge: 2017-04-03 | Disposition: A | Discharge status: Home / Self Care | Payer: PPO | Source: Ambulatory Visit | Attending: Family Medicine | Admitting: Family Medicine

## 2017-04-03 DIAGNOSIS — Z1231 Encounter for screening mammogram for malignant neoplasm of breast: Secondary | ICD-10-CM | POA: Insufficient documentation

## 2017-04-03 DIAGNOSIS — Z79899 Other long term (current) drug therapy: Secondary | ICD-10-CM | POA: Insufficient documentation

## 2017-04-03 DIAGNOSIS — M810 Age-related osteoporosis without current pathological fracture: Secondary | ICD-10-CM

## 2017-04-03 DIAGNOSIS — Z780 Asymptomatic menopausal state: Secondary | ICD-10-CM | POA: Diagnosis not present

## 2017-04-24 DIAGNOSIS — M3501 Sicca syndrome with keratoconjunctivitis: Secondary | ICD-10-CM | POA: Diagnosis not present

## 2017-07-12 ENCOUNTER — Other Ambulatory Visit: Payer: PPO

## 2017-07-12 DIAGNOSIS — I10 Essential (primary) hypertension: Secondary | ICD-10-CM

## 2017-07-12 DIAGNOSIS — E785 Hyperlipidemia, unspecified: Secondary | ICD-10-CM | POA: Diagnosis not present

## 2017-07-12 DIAGNOSIS — E039 Hypothyroidism, unspecified: Secondary | ICD-10-CM | POA: Diagnosis not present

## 2017-07-12 LAB — CBC WITH DIFFERENTIAL/PLATELET
BASOS ABS: 71 {cells}/uL (ref 0–200)
BASOS PCT: 1 %
EOS PCT: 1.4 %
Eosinophils Absolute: 99 cells/uL (ref 15–500)
HEMATOCRIT: 41 % (ref 35.0–45.0)
HEMOGLOBIN: 13.8 g/dL (ref 11.7–15.5)
LYMPHS ABS: 2130 {cells}/uL (ref 850–3900)
MCH: 29.2 pg (ref 27.0–33.0)
MCHC: 33.7 g/dL (ref 32.0–36.0)
MCV: 86.9 fL (ref 80.0–100.0)
MPV: 11 fL (ref 7.5–12.5)
Monocytes Relative: 7.1 %
NEUTROS ABS: 4296 {cells}/uL (ref 1500–7800)
Neutrophils Relative %: 60.5 %
Platelets: 259 10*3/uL (ref 140–400)
RBC: 4.72 10*6/uL (ref 3.80–5.10)
RDW: 12.7 % (ref 11.0–15.0)
Total Lymphocyte: 30 %
WBC mixed population: 504 cells/uL (ref 200–950)
WBC: 7.1 10*3/uL (ref 3.8–10.8)

## 2017-07-12 LAB — COMPREHENSIVE METABOLIC PANEL
AG RATIO: 1.5 (calc) (ref 1.0–2.5)
ALBUMIN MSPROF: 4.3 g/dL (ref 3.6–5.1)
ALT: 11 U/L (ref 6–29)
AST: 18 U/L (ref 10–35)
Alkaline phosphatase (APISO): 59 U/L (ref 33–130)
BUN: 10 mg/dL (ref 7–25)
CALCIUM: 10.1 mg/dL (ref 8.6–10.4)
CO2: 29 mmol/L (ref 20–32)
Chloride: 101 mmol/L (ref 98–110)
Creat: 0.75 mg/dL (ref 0.60–0.88)
GLOBULIN: 2.8 g/dL (ref 1.9–3.7)
Glucose, Bld: 86 mg/dL (ref 65–99)
POTASSIUM: 4.8 mmol/L (ref 3.5–5.3)
SODIUM: 140 mmol/L (ref 135–146)
TOTAL PROTEIN: 7.1 g/dL (ref 6.1–8.1)
Total Bilirubin: 0.4 mg/dL (ref 0.2–1.2)

## 2017-07-12 LAB — LIPID PANEL
CHOL/HDL RATIO: 2.7 (calc) (ref <5.0)
Cholesterol: 206 mg/dL — ABNORMAL HIGH (ref <200)
HDL: 75 mg/dL (ref >50)
LDL CHOLESTEROL (CALC): 112 mg/dL — AB
Non-HDL Cholesterol (Calc): 131 mg/dL (calc) — ABNORMAL HIGH (ref <130)
Triglycerides: 90 mg/dL (ref <150)

## 2017-07-12 LAB — TSH: TSH: 3.81 m[IU]/L (ref 0.40–4.50)

## 2017-07-15 ENCOUNTER — Ambulatory Visit: Payer: PPO | Admitting: Family Medicine

## 2017-07-16 ENCOUNTER — Encounter: Payer: Self-pay | Admitting: Family Medicine

## 2017-07-16 ENCOUNTER — Ambulatory Visit (INDEPENDENT_AMBULATORY_CARE_PROVIDER_SITE_OTHER): Payer: PPO | Admitting: Family Medicine

## 2017-07-16 VITALS — BP 152/90 | HR 70 | Temp 97.7°F | Resp 14 | Ht 62.5 in | Wt 128.0 lb

## 2017-07-16 DIAGNOSIS — E7800 Pure hypercholesterolemia, unspecified: Secondary | ICD-10-CM | POA: Diagnosis not present

## 2017-07-16 DIAGNOSIS — I10 Essential (primary) hypertension: Secondary | ICD-10-CM | POA: Diagnosis not present

## 2017-07-16 DIAGNOSIS — E039 Hypothyroidism, unspecified: Secondary | ICD-10-CM

## 2017-07-16 NOTE — Progress Notes (Signed)
Subjective:    Patient ID: Joanne Gomez, female    DOB: [birth year], 84 y.o.   MRN: [id number]  HPI  Patient is here today for follow up of hypothyroidism and hypertension.  She also has a history of hyperlipidemia.  She is here today to discuss her lab work.  Her cholesterol is much improved compared to her last visit with her LDL cholesterol at 112.  Her blood pressure today however remains elevated.  However the patient states that she feels extremely dizzy at times when she stands up.  Sometimes she feels faint when she stands up as though her blood pressures dropping.  She has never checked her blood pressure when she is symptomatic.  She has never checked her heart rate when she is symptomatic.  I recommended checking her blood pressure and heart rate multiple times over the next week and provide the values to me so that I can review them here to determine if her blood pressures dropping or if it is truly high at home.  She also reports constipation.  She states that despite taking stool softeners, she struggles to have a bowel movement every other day.  She also reports occasional abdominal pain which is mild and related to the constipation.  She denies any blood in her stool.  She denies any melena.  Lab on 07/12/2017  Component Date Value Ref Range Status  . WBC 07/12/2017 7.1  3.8 - 10.8 Thousand/uL Final  . RBC 07/12/2017 4.72  3.80 - 5.10 Million/uL Final  . Hemoglobin 07/12/2017 13.8  11.7 - 15.5 g/dL Final  . HCT 07/12/2017 41.0  35.0 - 45.0 % Final  . MCV 07/12/2017 86.9  80.0 - 100.0 fL Final  . MCH 07/12/2017 29.2  27.0 - 33.0 pg Final  . MCHC 07/12/2017 33.7  32.0 - 36.0 g/dL Final  . RDW 07/12/2017 12.7  11.0 - 15.0 % Final  . Platelets 07/12/2017 259  140 - 400 Thousand/uL Final  . MPV 07/12/2017 11.0  7.5 - 12.5 fL Final  . Neutro Abs 07/12/2017 4,296  1,500 - 7,800 cells/uL Final  . Lymphs Abs 07/12/2017 2,130  850 - 3,900 cells/uL Final  . WBC mixed population  07/12/2017 504  200 - 950 cells/uL Final  . Eosinophils Absolute 07/12/2017 99  15 - 500 cells/uL Final  . Basophils Absolute 07/12/2017 71  0 - 200 cells/uL Final  . Neutrophils Relative % 07/12/2017 60.5  % Final  . Total Lymphocyte 07/12/2017 30.0  % Final  . Monocytes Relative 07/12/2017 7.1  % Final  . Eosinophils Relative 07/12/2017 1.4  % Final  . Basophils Relative 07/12/2017 1.0  % Final  . Glucose, Bld 07/12/2017 86  65 - 99 mg/dL Final   Comment: .            Fasting reference interval .   . BUN 07/12/2017 10  7 - 25 mg/dL Final  . Creat 07/12/2017 0.75  0.60 - 0.88 mg/dL Final   Comment: For patients >53 years of age, the reference limit for Creatinine is approximately 13% higher for people identified as African-American. .   Havery Moros Ratio 80/99/8338 NOT APPLICABLE  6 - 22 (calc) Final  . Sodium 07/12/2017 140  135 - 146 mmol/L Final  . Potassium 07/12/2017 4.8  3.5 - 5.3 mmol/L Final  . Chloride 07/12/2017 101  98 - 110 mmol/L Final  . CO2 07/12/2017 29  20 - 32 mmol/L Final  . Calcium 07/12/2017 10.1  8.6 - 10.4 mg/dL Final  . Total Protein 07/12/2017 7.1  6.1 - 8.1 g/dL Final  . Albumin 07/12/2017 4.3  3.6 - 5.1 g/dL Final  . Globulin 07/12/2017 2.8  1.9 - 3.7 g/dL (calc) Final  . AG Ratio 07/12/2017 1.5  1.0 - 2.5 (calc) Final  . Total Bilirubin 07/12/2017 0.4  0.2 - 1.2 mg/dL Final  . Alkaline phosphatase (APISO) 07/12/2017 59  33 - 130 U/L Final  . AST 07/12/2017 18  10 - 35 U/L Final  . ALT 07/12/2017 11  6 - 29 U/L Final  . Cholesterol 07/12/2017 206* <200 mg/dL Final  . HDL 07/12/2017 75  >50 mg/dL Final  . Triglycerides 07/12/2017 90  <150 mg/dL Final  . LDL Cholesterol (Calc) 07/12/2017 112* mg/dL (calc) Final   Comment: Reference range: <100 . Desirable range <100 mg/dL for primary prevention;   <70 mg/dL for patients with CHD or diabetic patients  with > or = 2 CHD risk factors. Marland Kitchen LDL-C is now calculated using the Martin-Hopkins    calculation, which is a validated novel method providing  better accuracy than the Friedewald equation in the  estimation of LDL-C.  Cresenciano Genre et al. Annamaria Helling. 0277;412(87): 2061-2068  (http://education.Quest[facility].com/faq/FAQ164)   . Total CHOL/HDL Ratio 07/12/2017 2.7  <5.0 (calc) Final  . Non-HDL Cholesterol (Calc) 07/12/2017 131* <130 mg/dL (calc) Final   Comment: For patients with diabetes plus 1 major ASCVD risk  factor, treating to a non-HDL-C goal of <100 mg/dL  (LDL-C of <70 mg/dL) is considered a therapeutic  option.   . TSH 07/12/2017 3.81  0.40 - 4.50 mIU/L Final    Past Medical History:  Diagnosis Date  . Cancer ([place]) 11/2007   colon stage II  . Diverticula of colon   . GERD (gastroesophageal reflux disease)   . H/O colon cancer, stage II 11/15/2011  . HH (hiatus hernia)   . History of hiatal hernia   . HOH (hard of hearing)    MILD LOSS  . Hyperlipidemia   . Hypothyroidism   . Osteoporosis   . Pneumonia    Past Surgical History:  Procedure Laterality Date  . ABDOMINAL HYSTERECTOMY    . APPENDECTOMY    . CATARACT EXTRACTION W/PHACO Right 08/30/2015   Procedure: CATARACT EXTRACTION PHACO AND INTRAOCULAR LENS PLACEMENT (IOC);  Surgeon: Birder Robson, MD;  Location: [facility] ORS;  Service: Ophthalmology;  Laterality: Right;   [place]    00:40.8 AP%  15.7 CDE    6.42   fluid pack lot #[id number]  exp 02/24/2017  . CATARACT EXTRACTION W/PHACO Left 09/20/2015   Procedure: CATARACT EXTRACTION PHACO AND INTRAOCULAR LENS PLACEMENT (IOC);  Surgeon: Birder Robson, MD;  Location: [facility] ORS;  Service: Ophthalmology;  Laterality: Left;  [place] 44.8 AP% 19.6 CDE 8.77 FLUID PACK LOT # [id number]  . CHOLECYSTECTOMY    . ELBOW SURGERY     right   . hemocolectomy  2009  . VAGINAL PROLAPSE REPAIR     Current Outpatient Medications on File Prior to Visit  Medication Sig Dispense Refill  . ARMOUR THYROID 60 MG tablet TAKE ONE TABLET BY MOUTH EVERY MORNING BEFORE BREAKFAST 90 tablet 3   . CALCIUM CITRATE PO Take 1 tablet by mouth 2 (two) times daily. 630mg     . Cholecalciferol (VITAMIN D3) 2000 units TABS Take 1 tablet by mouth daily.    Marland Kitchen denosumab (PROLIA) 60 MG/ML SOLN injection Inject 60 mg into the skin every 6 (six) months. Administer in upper arm, thigh, or  abdomen    . esomeprazole (NEXIUM) 40 MG capsule TAKE 1 CAPSULE BY MOUTH EVERY DAY 90 capsule 4  . Magnesium 250 MG TABS Take 1 tablet by mouth daily.    . Multiple Vitamin (MULTIVITAMIN) tablet Take 1 tablet by mouth daily.      Marland Kitchen nystatin cream (MYCOSTATIN) Mix together with Kenalog cream     Current Facility-Administered Medications on File Prior to Visit  Medication Dose Route Frequency Provider Last Rate Last Dose  . denosumab (PROLIA) injection 60 mg  60 mg Subcutaneous Q6 months Susy Frizzle, MD   60 mg at 01/30/17 1021   No Known Allergies Social History   Socioeconomic History  . Marital status: Married    Spouse name: Not on file  . Number of children: Not on file  . Years of education: Not on file  . Highest education level: Not on file  Social Needs  . Financial resource strain: Not on file  . Food insecurity - worry: Not on file  . Food insecurity - inability: Not on file  . Transportation needs - medical: Not on file  . Transportation needs - non-medical: Not on file  Occupational History  . Not on file  Tobacco Use  . Smoking status: Never Smoker  . Smokeless tobacco: Never Used  Substance and Sexual Activity  . Alcohol use: Yes    Comment: occ  . Drug use: No  . Sexual activity: Yes    Comment: Married to [place], retired.  Other Topics Concern  . Not on file  Social History Narrative  . Not on file   Family History  Problem Relation Age of Onset  . Heart attack Sister 69  . Hyperlipidemia Sister   . Hypertension Sister      Review of Systems  All other systems reviewed and are negative.      Objective:   Physical Exam  Constitutional: She is oriented to person,  place, and time. She appears well-developed and well-nourished. No distress.  HENT:  Head: Normocephalic and atraumatic.  Right Ear: External ear normal.  Left Ear: External ear normal.  Nose: Nose normal.  Mouth/Throat: Oropharynx is clear and moist. No oropharyngeal exudate.  Eyes: Conjunctivae and EOM are normal. Pupils are equal, round, and reactive to light. Left eye exhibits no discharge. No scleral icterus.  Neck: Normal range of motion. Neck supple. No JVD present. No tracheal deviation present. No thyromegaly present.  Cardiovascular: Normal rate, regular rhythm, normal heart sounds and intact distal pulses. Exam reveals no gallop and no friction rub.  No murmur heard. Pulmonary/Chest: Effort normal and breath sounds normal. No stridor. No respiratory distress. She has no wheezes. She has no rales. She exhibits no tenderness.  Abdominal: Soft. Bowel sounds are normal. She exhibits no distension and no mass. There is no tenderness. There is no rebound and no guarding.  Musculoskeletal: Normal range of motion. She exhibits no edema or tenderness.  Lymphadenopathy:    She has no cervical adenopathy.  Neurological: She is alert and oriented to person, place, and time. She has normal reflexes. No cranial nerve deficit. She exhibits normal muscle tone. Coordination normal.  Skin: Skin is warm. No rash noted. No erythema. No pallor.  Psychiatric: She has a normal mood and affect. Her behavior is normal. Judgment and thought content normal.  Vitals reviewed.         Assessment & Plan:  Pure hypercholesterolemia  Essential hypertension  Hypothyroidism, unspecified type  Cholesterol is excellent.  I will make no changes at the present time.  Her blood pressure today is elevated.  I have recommended that the patient check her blood pressure numerous times at home over the next week particularly given her orthostatic dizziness and then allow me to review those values to determine if she  requires no medication to lower her blood pressure.  Her thyroid appears to be appropriately dosed as her TSH is within therapeutic range.  I recommended trying Linzess 145 mcg poqday for constipation.  Reassess in 2 weeks to determine if the constipation has improved on the medication

## 2017-07-24 ENCOUNTER — Telehealth: Payer: Self-pay | Admitting: Family Medicine

## 2017-07-24 MED ORDER — PANTOPRAZOLE SODIUM 40 MG PO TBEC: 40.0000 mg | DELAYED_RELEASE_TABLET | Freq: Every day | ORAL | 3 refills | Status: DC

## 2017-07-24 MED ORDER — LINACLOTIDE 145 MCG PO CAPS: 145.0000 ug | ORAL_CAPSULE | Freq: Every day | ORAL | 5 refills | Status: DC

## 2017-07-24 NOTE — Telephone Encounter (Signed)
Pt called and states that the Linzess samples are working great and would like to know if she needs to continue them. Also she has been on nexium for multiple years and it does not work as well now and would like something different.   Per Dr. Dennard Schaumann continue Linzess and will change Nexium to Protonix. Med sent to pharm and pt aware via vm.

## 2017-08-02 ENCOUNTER — Ambulatory Visit (INDEPENDENT_AMBULATORY_CARE_PROVIDER_SITE_OTHER): Payer: PPO | Admitting: Family Medicine

## 2017-08-02 ENCOUNTER — Ambulatory Visit: Payer: PPO

## 2017-08-02 ENCOUNTER — Encounter: Payer: Self-pay | Admitting: Family Medicine

## 2017-08-02 VITALS — BP 160/82 | HR 82 | Temp 97.6°F | Resp 14 | Ht 62.5 in | Wt 129.0 lb

## 2017-08-02 DIAGNOSIS — I10 Essential (primary) hypertension: Secondary | ICD-10-CM | POA: Diagnosis not present

## 2017-08-02 DIAGNOSIS — M810 Age-related osteoporosis without current pathological fracture: Secondary | ICD-10-CM | POA: Diagnosis not present

## 2017-08-02 MED ORDER — LISINOPRIL-HYDROCHLOROTHIAZIDE 20-12.5 MG PO TABS
1.0000 | ORAL_TABLET | Freq: Every day | ORAL | 3 refills | Status: DC
Start: 2017-08-02 — End: 2017-09-18

## 2017-08-02 NOTE — Progress Notes (Signed)
Subjective:    Patient ID: Joanne Gomez, female    DOB: 12-Jul-1930, 84 y.o.   MRN: [id number]  HPI Patient's blood pressure has been elevated between 150 and 160/80-90 at home. She denies any chest pain shortness of breath or dyspnea on exertion but she is now willing to except that she needs medication for high blood pressure and is here today to discuss treatment options. Past Medical History:  Diagnosis Date  . Cancer ([place]) 11/2007   colon stage II  . Diverticula of colon   . GERD (gastroesophageal reflux disease)   . H/O colon cancer, stage II 11/15/2011  . HH (hiatus hernia)   . History of hiatal hernia   . HOH (hard of hearing)    MILD LOSS  . Hyperlipidemia   . Hypothyroidism   . Osteoporosis   . Pneumonia    Past Surgical History:  Procedure Laterality Date  . ABDOMINAL HYSTERECTOMY    . APPENDECTOMY    . CATARACT EXTRACTION W/PHACO Right 08/30/2015   Procedure: CATARACT EXTRACTION PHACO AND INTRAOCULAR LENS PLACEMENT (IOC);  Surgeon: Birder Robson, MD;  Location: [facility] ORS;  Service: Ophthalmology;  Laterality: Right;   [place]    00:40.8 AP%  15.7 CDE    6.42   fluid pack lot #[id number]  exp 02/24/2017  . CATARACT EXTRACTION W/PHACO Left 09/20/2015   Procedure: CATARACT EXTRACTION PHACO AND INTRAOCULAR LENS PLACEMENT (IOC);  Surgeon: Birder Robson, MD;  Location: [facility];  Service: Ophthalmology;  Laterality: Left;  [place] 44.8 AP% 19.6 CDE 8.77 FLUID PACK LOT # [id number]  . CHOLECYSTECTOMY    . ELBOW SURGERY     right   . hemocolectomy  2009  . VAGINAL PROLAPSE REPAIR     Current Outpatient Medications on File Prior to Visit  Medication Sig Dispense Refill  . ARMOUR THYROID 60 MG tablet TAKE ONE TABLET BY MOUTH EVERY MORNING BEFORE BREAKFAST 90 tablet 3  . CALCIUM CITRATE PO Take 1 tablet by mouth 2 (two) times daily. 630mg     . Cholecalciferol (VITAMIN D3) 2000 units TABS Take 1 tablet by mouth daily.    Marland Kitchen denosumab (PROLIA) 60 MG/ML SOLN injection Inject 60 mg  into the skin every 6 (six) months. Administer in upper arm, thigh, or abdomen    . linaclotide (LINZESS) 145 MCG CAPS capsule Take 1 capsule (145 mcg total) by mouth daily before breakfast. 30 capsule 5  . Magnesium 250 MG TABS Take 1 tablet by mouth daily.    . Multiple Vitamin (MULTIVITAMIN) tablet Take 1 tablet by mouth daily.      Marland Kitchen nystatin cream (MYCOSTATIN) Apply 1 application topically 2 (two) times daily. Mix together with Kenalog cream    . pantoprazole (PROTONIX) 40 MG tablet Take 1 tablet (40 mg total) by mouth daily. 90 tablet 3   Current Facility-Administered Medications on File Prior to Visit  Medication Dose Route Frequency Provider Last Rate Last Dose  . denosumab (PROLIA) injection 60 mg  60 mg Subcutaneous Q6 months Susy Frizzle, MD   60 mg at 01/30/17 1021   No Known Allergies Social History   Socioeconomic History  . Marital status: Married    Spouse name: Not on file  . Number of children: Not on file  . Years of education: Not on file  . Highest education level: Not on file  Social Needs  . Financial resource strain: Not on file  . Food insecurity - worry: Not on file  . Food insecurity -  inability: Not on file  . Transportation needs - medical: Not on file  . Transportation needs - non-medical: Not on file  Occupational History  . Not on file  Tobacco Use  . Smoking status: Never Smoker  . Smokeless tobacco: Never Used  Substance and Sexual Activity  . Alcohol use: Yes    Comment: occ  . Drug use: No  . Sexual activity: Yes    Comment: Married to [place], retired.  Other Topics Concern  . Not on file  Social History Narrative  . Not on file      Review of Systems  All other systems reviewed and are negative.      Objective:   Physical Exam  Constitutional: She appears well-developed and well-nourished.  Cardiovascular: Normal rate, regular rhythm and normal heart sounds.  Pulmonary/Chest: Effort normal and breath sounds normal. No  respiratory distress. She has no wheezes. She has no rales.  Abdominal: Soft. Bowel sounds are normal.  Vitals reviewed.         Assessment & Plan:  Essential hypertension  Begin lisinopril HCTZ 20/12.5 one by mouth daily and recheck blood pressure and BMP in 1 month

## 2017-09-03 ENCOUNTER — Ambulatory Visit (INDEPENDENT_AMBULATORY_CARE_PROVIDER_SITE_OTHER): Payer: PPO | Admitting: Family Medicine

## 2017-09-03 ENCOUNTER — Encounter: Payer: Self-pay | Admitting: Family Medicine

## 2017-09-03 VITALS — BP 140/60 | HR 74 | Temp 97.8°F | Resp 14 | Ht 62.5 in | Wt 131.0 lb

## 2017-09-03 DIAGNOSIS — I10 Essential (primary) hypertension: Secondary | ICD-10-CM

## 2017-09-03 NOTE — Progress Notes (Signed)
Subjective:    Patient ID: Joanne Gomez, female    DOB: August 11, 1930, 84 y.o.   MRN: [id number]  HPI  08/02/17 Patient's blood pressure has been elevated between 150 and 160/80-90 at home. She denies any chest pain shortness of breath or dyspnea on exertion but she is now willing to except that she needs medication for high blood pressure and is here today to discuss treatment options.  At that time, my plan was: Begin lisinopril HCTZ 20/12.5 one by mouth daily and recheck blood pressure and BMP in 1 month  09/03/17 Patient's blood pressure has been excellent since starting the new medication.  She comes in today with almost 20 different readings that she is taken over the last month.  The vast majority are averaging between 123 and 145/66-82.  She denies any side effects on the medication typically she denies any cough. Past Medical History:  Diagnosis Date  . Cancer ([place]) 11/2007   colon stage II  . Diverticula of colon   . GERD (gastroesophageal reflux disease)   . H/O colon cancer, stage II 11/15/2011  . HH (hiatus hernia)   . History of hiatal hernia   . HOH (hard of hearing)    MILD LOSS  . Hyperlipidemia   . Hypothyroidism   . Osteoporosis   . Pneumonia    Past Surgical History:  Procedure Laterality Date  . ABDOMINAL HYSTERECTOMY    . APPENDECTOMY    . CATARACT EXTRACTION W/PHACO Right 08/30/2015   Procedure: CATARACT EXTRACTION PHACO AND INTRAOCULAR LENS PLACEMENT (IOC);  Surgeon: Birder Robson, MD;  Location: [facility];  Service: Ophthalmology;  Laterality: Right;   [place]    00:40.8 AP%  15.7 CDE    6.42   fluid pack lot #[id number] H  exp 02/24/2017  . CATARACT EXTRACTION W/PHACO Left 09/20/2015   Procedure: CATARACT EXTRACTION PHACO AND INTRAOCULAR LENS PLACEMENT (IOC);  Surgeon: Birder Robson, MD;  Location: [facility];  Service: Ophthalmology;  Laterality: Left;  [place] 44.8 AP% 19.6 CDE 8.77 FLUID PACK LOT # [id number]  . CHOLECYSTECTOMY    . ELBOW SURGERY     right   .  hemocolectomy  2009  . VAGINAL PROLAPSE REPAIR     Current Outpatient Medications on File Prior to Visit  Medication Sig Dispense Refill  . ARMOUR THYROID 60 MG tablet TAKE ONE TABLET BY MOUTH EVERY MORNING BEFORE BREAKFAST 90 tablet 3  . CALCIUM CITRATE PO Take 1 tablet by mouth 2 (two) times daily. 630mg     . Cholecalciferol (VITAMIN D3) 2000 units TABS Take 1 tablet by mouth daily.    Marland Kitchen denosumab (PROLIA) 60 MG/ML SOLN injection Inject 60 mg into the skin every 6 (six) months. Administer in upper arm, thigh, or abdomen    . linaclotide (LINZESS) 145 MCG CAPS capsule Take 1 capsule (145 mcg total) by mouth daily before breakfast. (Patient taking differently: Take 145 mcg by mouth as needed. ) 30 capsule 5  . lisinopril-hydrochlorothiazide (ZESTORETIC) 20-12.5 MG tablet Take 1 tablet by mouth daily. 90 tablet 3  . Magnesium 250 MG TABS Take 1 tablet by mouth daily.    . Multiple Vitamin (MULTIVITAMIN) tablet Take 1 tablet by mouth daily.      Marland Kitchen nystatin cream (MYCOSTATIN) Apply 1 application topically 2 (two) times daily. Mix together with Kenalog cream    . pantoprazole (PROTONIX) 40 MG tablet Take 1 tablet (40 mg total) by mouth daily. 90 tablet 3   Current Facility-Administered Medications on File Prior to  Visit  Medication Dose Route Frequency Provider Last Rate Last Dose  . denosumab (PROLIA) injection 60 mg  60 mg Subcutaneous Q6 months Susy Frizzle, MD   60 mg at 08/02/17 1617   No Known Allergies Social History   Socioeconomic History  . Marital status: Married    Spouse name: Not on file  . Number of children: Not on file  . Years of education: Not on file  . Highest education level: Not on file  Occupational History  . Not on file  Social Needs  . Financial resource strain: Not on file  . Food insecurity:    Worry: Not on file    Inability: Not on file  . Transportation needs:    Medical: Not on file    Non-medical: Not on file  Tobacco Use  . Smoking status:  Never Smoker  . Smokeless tobacco: Never Used  Substance and Sexual Activity  . Alcohol use: Yes    Comment: occ  . Drug use: No  . Sexual activity: Yes    Comment: Married to [place], retired.  Lifestyle  . Physical activity:    Days per week: Not on file    Minutes per session: Not on file  . Stress: Not on file  Relationships  . Social connections:    Talks on phone: Not on file    Gets together: Not on file    Attends religious service: Not on file    Active member of club or organization: Not on file    Attends meetings of clubs or organizations: Not on file    Relationship status: Not on file  . Intimate partner violence:    Fear of current or ex partner: Not on file    Emotionally abused: Not on file    Physically abused: Not on file    Forced sexual activity: Not on file  Other Topics Concern  . Not on file  Social History Narrative  . Not on file      Review of Systems  All other systems reviewed and are negative.      Objective:   Physical Exam  Constitutional: She appears well-developed and well-nourished.  Cardiovascular: Normal rate, regular rhythm and normal heart sounds.  Pulmonary/Chest: Effort normal and breath sounds normal. No respiratory distress. She has no wheezes. She has no rales.  Abdominal: Soft. Bowel sounds are normal.  Vitals reviewed.         Assessment & Plan:  Essential hypertension - Plan: BASIC METABOLIC PANEL WITH GFR  I am very happy with her blood pressure.  We will make no changes in her medication.  I will check a BMP to monitor her renal function and potassium.  If lab work is stable, I would plan on seeing the patient again in 6 months or as needed

## 2017-09-04 LAB — BASIC METABOLIC PANEL WITH GFR
BUN: 11 mg/dL (ref 7–25)
CALCIUM: 9.3 mg/dL (ref 8.6–10.4)
CHLORIDE: 89 mmol/L — AB (ref 98–110)
CO2: 32 mmol/L (ref 20–32)
Creat: 0.61 mg/dL (ref 0.60–0.88)
GFR, Est African American: 96 mL/min/{1.73_m2} (ref >=60)
GFR, Est Non African American: 83 mL/min/{1.73_m2} (ref >=60)
GLUCOSE: 111 mg/dL — AB (ref 65–99)
Potassium: 4.2 mmol/L (ref 3.5–5.3)
Sodium: 127 mmol/L — ABNORMAL LOW (ref 135–146)

## 2017-09-04 LAB — EXTRA LAV TOP TUBE

## 2017-09-05 ENCOUNTER — Other Ambulatory Visit: Payer: PPO

## 2017-09-05 ENCOUNTER — Other Ambulatory Visit: Payer: Self-pay | Admitting: *Deleted

## 2017-09-05 DIAGNOSIS — E871 Hypo-osmolality and hyponatremia: Secondary | ICD-10-CM

## 2017-09-06 LAB — BASIC METABOLIC PANEL
BUN: 10 mg/dL (ref 7–25)
CALCIUM: 9.5 mg/dL (ref 8.6–10.4)
CO2: 29 mmol/L (ref 20–32)
Chloride: 90 mmol/L — ABNORMAL LOW (ref 98–110)
Creat: 0.87 mg/dL (ref 0.60–0.88)
GLUCOSE: 102 mg/dL — AB (ref 65–99)
Potassium: 4.6 mmol/L (ref 3.5–5.3)
SODIUM: 128 mmol/L — AB (ref 135–146)

## 2017-09-09 ENCOUNTER — Other Ambulatory Visit: Payer: Self-pay | Admitting: Family Medicine

## 2017-09-09 DIAGNOSIS — E871 Hypo-osmolality and hyponatremia: Secondary | ICD-10-CM

## 2017-09-16 ENCOUNTER — Encounter: Payer: Self-pay | Admitting: Family Medicine

## 2017-09-16 ENCOUNTER — Ambulatory Visit (INDEPENDENT_AMBULATORY_CARE_PROVIDER_SITE_OTHER): Payer: PPO | Admitting: Family Medicine

## 2017-09-16 VITALS — BP 160/80 | HR 88 | Temp 98.3°F | Resp 16 | Ht 62.5 in | Wt 126.0 lb

## 2017-09-16 DIAGNOSIS — S0010XA Contusion of unspecified eyelid and periocular area, initial encounter: Secondary | ICD-10-CM | POA: Diagnosis not present

## 2017-09-16 DIAGNOSIS — W19XXXA Unspecified fall, initial encounter: Secondary | ICD-10-CM | POA: Diagnosis not present

## 2017-09-16 DIAGNOSIS — E871 Hypo-osmolality and hyponatremia: Secondary | ICD-10-CM | POA: Diagnosis not present

## 2017-09-16 LAB — BASIC METABOLIC PANEL WITH GFR
BUN: 10 mg/dL (ref 7–25)
CHLORIDE: 91 mmol/L — AB (ref 98–110)
CO2: 31 mmol/L (ref 20–32)
Calcium: 9.8 mg/dL (ref 8.6–10.4)
Creat: 0.63 mg/dL (ref 0.60–0.88)
GFR, EST AFRICAN AMERICAN: 95 mL/min/{1.73_m2} (ref >=60)
GFR, Est Non African American: 82 mL/min/{1.73_m2} (ref >=60)
Glucose, Bld: 103 mg/dL — ABNORMAL HIGH (ref 65–99)
POTASSIUM: 4.6 mmol/L (ref 3.5–5.3)
SODIUM: 130 mmol/L — AB (ref 135–146)

## 2017-09-16 NOTE — Progress Notes (Signed)
Subjective:    Patient ID: Joanne Gomez, female    DOB: [birth year], 84 y.o.   MRN: [id number]  HPI  08/02/17 Patient's blood pressure has been elevated between 150 and 160/80-90 at home. She denies any chest pain shortness of breath or dyspnea on exertion but she is now willing to except that she needs medication for high blood pressure and is here today to discuss treatment options.  At that time, my plan was: Begin lisinopril HCTZ 20/12.5 one by mouth daily and recheck blood pressure and BMP in 1 month  09/03/17 Patient's blood pressure has been excellent since starting the new medication.  She comes in today with almost 20 different readings that she is taken over the last month.  The vast majority are averaging between 123 and 145/66-82.  She denies any side effects on the medication typically she denies any cough.  At that time, my plan was: I am very happy with her blood pressure.  We will make no changes in her medication.  I will check a BMP to monitor her renal function and potassium.  If lab work is stable, I would plan on seeing the patient again in 6 months or as needed  09/16/17 After I saw the patient last time, her lab work returned showing hyponatremia.  This was confirmed on a repeat sample.  Patient has no history of hyponatremia.  She has been restricting her fluids as directed.  She is here today to recheck a BMP.  Her blood pressure is elevated here however her blood pressures at home are on average excellent.  They range from 416-606 systolic but the vast majority are 120-145/70-84.  He denies any side effects from the medication as far she can tells.  Unfortunately she fell on Wednesday striking the right temple on the ground.  This forced her glasses which she was wearing at the time into her face bruising her cheeks and periorbital area bilaterally.  She has raccoon eyes today on exam.  She denies any loss of consciousness.  She denies any neurologic deficits.  She denies any  blurry vision.  She denies any nausea or vomiting or other symptoms of increased intracranial pressure. Past Medical History:  Diagnosis Date  . Cancer ([place]) 11/2007   colon stage II  . Diverticula of colon   . GERD (gastroesophageal reflux disease)   . H/O colon cancer, stage II 11/15/2011  . HH (hiatus hernia)   . History of hiatal hernia   . HOH (hard of hearing)    MILD LOSS  . Hyperlipidemia   . Hypothyroidism   . Osteoporosis   . Pneumonia    Past Surgical History:  Procedure Laterality Date  . ABDOMINAL HYSTERECTOMY    . APPENDECTOMY    . CATARACT EXTRACTION W/PHACO Right 08/30/2015   Procedure: CATARACT EXTRACTION PHACO AND INTRAOCULAR LENS PLACEMENT (IOC);  Surgeon: Birder Robson, MD;  Location: [facility];  Service: Ophthalmology;  Laterality: Right;   [place]    00:40.8 AP%  15.7 CDE    6.42   fluid pack lot #[id number]  exp 02/24/2017  . CATARACT EXTRACTION W/PHACO Left 09/20/2015   Procedure: CATARACT EXTRACTION PHACO AND INTRAOCULAR LENS PLACEMENT (IOC);  Surgeon: Birder Robson, MD;  Location: [facility];  Service: Ophthalmology;  Laterality: Left;  [place] 44.8 AP% 19.6 CDE 8.77 FLUID PACK LOT # [id number]  . CHOLECYSTECTOMY    . ELBOW SURGERY     right   . hemocolectomy  2009  . VAGINAL PROLAPSE  REPAIR     Current Outpatient Medications on File Prior to Visit  Medication Sig Dispense Refill  . ARMOUR THYROID 60 MG tablet TAKE ONE TABLET BY MOUTH EVERY MORNING BEFORE BREAKFAST 90 tablet 3  . CALCIUM CITRATE PO Take 1 tablet by mouth 2 (two) times daily. 630mg     . Cholecalciferol (VITAMIN D3) 2000 units TABS Take 1 tablet by mouth daily.    Marland Kitchen denosumab (PROLIA) 60 MG/ML SOLN injection Inject 60 mg into the skin every 6 (six) months. Administer in upper arm, thigh, or abdomen    . linaclotide (LINZESS) 145 MCG CAPS capsule Take 1 capsule (145 mcg total) by mouth daily before breakfast. (Patient taking differently: Take 145 mcg by mouth as needed. ) 30 capsule 5  .  lisinopril-hydrochlorothiazide (ZESTORETIC) 20-12.5 MG tablet Take 1 tablet by mouth daily. 90 tablet 3  . Magnesium 250 MG TABS Take 1 tablet by mouth daily.    . Multiple Vitamin (MULTIVITAMIN) tablet Take 1 tablet by mouth daily.      Marland Kitchen nystatin cream (MYCOSTATIN) Apply 1 application topically 2 (two) times daily. Mix together with Kenalog cream    . pantoprazole (PROTONIX) 40 MG tablet Take 1 tablet (40 mg total) by mouth daily. 90 tablet 3   Current Facility-Administered Medications on File Prior to Visit  Medication Dose Route Frequency Provider Last Rate Last Dose  . denosumab (PROLIA) injection 60 mg  60 mg Subcutaneous Q6 months Susy Frizzle, MD   60 mg at 08/02/17 1617   No Known Allergies Social History   Socioeconomic History  . Marital status: Married    Spouse name: Not on file  . Number of children: Not on file  . Years of education: Not on file  . Highest education level: Not on file  Occupational History  . Not on file  Social Needs  . Financial resource strain: Not on file  . Food insecurity:    Worry: Not on file    Inability: Not on file  . Transportation needs:    Medical: Not on file    Non-medical: Not on file  Tobacco Use  . Smoking status: Never Smoker  . Smokeless tobacco: Never Used  Substance and Sexual Activity  . Alcohol use: Yes    Comment: occ  . Drug use: No  . Sexual activity: Yes    Comment: Married to [place], retired.  Lifestyle  . Physical activity:    Days per week: Not on file    Minutes per session: Not on file  . Stress: Not on file  Relationships  . Social connections:    Talks on phone: Not on file    Gets together: Not on file    Attends religious service: Not on file    Active member of club or organization: Not on file    Attends meetings of clubs or organizations: Not on file    Relationship status: Not on file  . Intimate partner violence:    Fear of current or ex partner: Not on file    Emotionally abused: Not on  file    Physically abused: Not on file    Forced sexual activity: Not on file  Other Topics Concern  . Not on file  Social History Narrative  . Not on file      Review of Systems  All other systems reviewed and are negative.      Objective:   Physical Exam  Constitutional: She appears well-developed and well-nourished.  Cardiovascular: Normal rate, regular rhythm and normal heart sounds.  Pulmonary/Chest: Effort normal and breath sounds normal. No respiratory distress. She has no wheezes. She has no rales.  Abdominal: Soft. Bowel sounds are normal.  Vitals reviewed.   Patient has significant periorbital bruising.  The bruises are a turning yellowish brown in color the bruises are already turning yellowish brown in color.  Pupils are equal round and reactive to light.  Extraocular motions are intact.  There is no papilledema today on exam.  Cranial nerves II through XII are grossly intact with muscle strength 5/5 equal and symmetric in the upper and lower extremities.  She has no ataxia and no problems with coordination      Assessment & Plan:  Hyponatremia - Plan: Joanne Gomez GFR  Fall, initial encounter  I have very little suspicion for a basilar skull fracture.  I believe the bruises are a result of her glasses when she fell.  There is no sign of intracranial hemorrhage today on exam.  I will recheck a BMP.  If sodium remains low, I will change medication most likely to amlodipine.

## 2017-09-18 ENCOUNTER — Other Ambulatory Visit: Payer: Self-pay | Admitting: Family Medicine

## 2017-09-18 MED ORDER — AMLODIPINE BESYLATE 10 MG PO TABS: 10.0000 mg | ORAL_TABLET | Freq: Every day | ORAL | 3 refills | Status: DC

## 2017-10-03 ENCOUNTER — Ambulatory Visit (INDEPENDENT_AMBULATORY_CARE_PROVIDER_SITE_OTHER): Payer: PPO | Admitting: Family Medicine

## 2017-10-03 ENCOUNTER — Encounter: Payer: Self-pay | Admitting: Family Medicine

## 2017-10-03 VITALS — BP 150/78 | HR 88 | Temp 98.0°F | Resp 14 | Ht 62.5 in | Wt 126.0 lb

## 2017-10-03 DIAGNOSIS — E871 Hypo-osmolality and hyponatremia: Secondary | ICD-10-CM | POA: Diagnosis not present

## 2017-10-03 LAB — BASIC METABOLIC PANEL WITH GFR
BUN: 11 mg/dL (ref 7–25)
CO2: 30 mmol/L (ref 20–32)
CREATININE: 0.6 mg/dL (ref 0.60–0.88)
Calcium: 10.1 mg/dL (ref 8.6–10.4)
Chloride: 102 mmol/L (ref 98–110)
GFR, EST AFRICAN AMERICAN: 97 mL/min/{1.73_m2} (ref >=60)
GFR, EST NON AFRICAN AMERICAN: 84 mL/min/{1.73_m2} (ref >=60)
Glucose, Bld: 113 mg/dL — ABNORMAL HIGH (ref 65–99)
POTASSIUM: 4.4 mmol/L (ref 3.5–5.3)
SODIUM: 139 mmol/L (ref 135–146)

## 2017-10-03 NOTE — Progress Notes (Signed)
Subjective:    Patient ID: Joanne Gomez, female    DOB: [birth year], 84 y.o.   MRN: [id number]  HPI   08/02/17 Patient's blood pressure has been elevated between 150 and 160/80-90 at home. She denies any chest pain shortness of breath or dyspnea on exertion but she is now willing to except that she needs medication for high blood pressure and is here today to discuss treatment options.  At that time, my plan was: Begin lisinopril HCTZ 20/12.5 one by mouth daily and recheck blood pressure and BMP in 1 month  09/03/17 Patient's blood pressure has been excellent since starting the new medication.  She comes in today with almost 20 different readings that she is taken over the last month.  The vast majority are averaging between 123 and 145/66-82.  She denies any side effects on the medication typically she denies any cough.  At that time, my plan was: I am very happy with her blood pressure.  We will make no changes in her medication.  I will check a BMP to monitor her renal function and potassium.  If lab work is stable, I would plan on seeing the patient again in 6 months or as needed  09/16/17 After I saw the patient last time, her lab work returned showing hyponatremia.  This was confirmed on a repeat sample.  Patient has no history of hyponatremia.  She has been restricting her fluids as directed.  She is here today to recheck a BMP.  Her blood pressure is elevated here however her blood pressures at home are on average excellent.  They range from 258-527 systolic but the vast majority are 120-145/70-84.  He denies any side effects from the medication as far she can tells.  Unfortunately she fell on Wednesday striking the right temple on the ground.  This forced her glasses which she was wearing at the time into her face bruising her cheeks and periorbital area bilaterally.  She has raccoon eyes today on exam.  She denies any loss of consciousness.  She denies any neurologic deficits.  She denies any  blurry vision.  She denies any nausea or vomiting or other symptoms of increased intracranial pressure.  At that time, my plan was:  I have very little suspicion for a basilar skull fracture.  I believe the bruises are a result of her glasses when she fell.  There is no sign of intracranial hemorrhage today on exam.  I will recheck a BMP.  If sodium remains low, I will change medication most likely to amlodipine.  10/03/17 Bruising has improved dramatically has almost essentially faded.  Her blood pressure at home is consistently between 782 and 423 systolic.  Several days is in the 130s.  Given her age, I believe that this is acceptable.  She is tolerating the amlodipine without any difficulty.  She is here today mainly to recheck her sodium as I believe her hyponatremia was related to the lisinopril HCTZ.  She denies any dizziness or headaches or nausea or vomiting. Past Medical History:  Diagnosis Date  . Cancer ([place]) 11/2007   colon stage II  . Diverticula of colon   . GERD (gastroesophageal reflux disease)   . H/O colon cancer, stage II 11/15/2011  . HH (hiatus hernia)   . History of hiatal hernia   . HOH (hard of hearing)    MILD LOSS  . Hyperlipidemia   . Hypothyroidism   . Osteoporosis   . Pneumonia  Past Surgical History:  Procedure Laterality Date  . ABDOMINAL HYSTERECTOMY    . APPENDECTOMY    . CATARACT EXTRACTION W/PHACO Right 08/30/2015   Procedure: CATARACT EXTRACTION PHACO AND INTRAOCULAR LENS PLACEMENT (IOC);  Surgeon: Birder Robson, MD;  Location: [facility] ORS;  Service: Ophthalmology;  Laterality: Right;   [place]    00:40.8 AP%  15.7 CDE    6.42   fluid pack lot #[id number]  exp 02/24/2017  . CATARACT EXTRACTION W/PHACO Left 09/20/2015   Procedure: CATARACT EXTRACTION PHACO AND INTRAOCULAR LENS PLACEMENT (IOC);  Surgeon: Birder Robson, MD;  Location: [facility] ORS;  Service: Ophthalmology;  Laterality: Left;  [place] 44.8 AP% 19.6 CDE 8.77 FLUID PACK LOT # [id number]  .  CHOLECYSTECTOMY    . ELBOW SURGERY     right   . hemocolectomy  2009  . VAGINAL PROLAPSE REPAIR     Current Outpatient Medications on File Prior to Visit  Medication Sig Dispense Refill  . amLODipine (NORVASC) 10 MG tablet Take 1 tablet (10 mg total) by mouth daily. 90 tablet 3  . ARMOUR THYROID 60 MG tablet TAKE ONE TABLET BY MOUTH EVERY MORNING BEFORE BREAKFAST 90 tablet 3  . CALCIUM CITRATE PO Take 1 tablet by mouth 2 (two) times daily. 630mg     . Cholecalciferol (VITAMIN D3) 2000 units TABS Take 1 tablet by mouth daily.    Marland Kitchen denosumab (PROLIA) 60 MG/ML SOLN injection Inject 60 mg into the skin every 6 (six) months. Administer in upper arm, thigh, or abdomen    . linaclotide (LINZESS) 145 MCG CAPS capsule Take 1 capsule (145 mcg total) by mouth daily before breakfast. (Patient taking differently: Take 145 mcg by mouth as needed. ) 30 capsule 5  . Magnesium 250 MG TABS Take 1 tablet by mouth daily.    . Multiple Vitamin (MULTIVITAMIN) tablet Take 1 tablet by mouth daily.      Marland Kitchen nystatin cream (MYCOSTATIN) Apply 1 application topically 2 (two) times daily.     . pantoprazole (PROTONIX) 40 MG tablet Take 1 tablet (40 mg total) by mouth daily. 90 tablet 3   Current Facility-Administered Medications on File Prior to Visit  Medication Dose Route Frequency Provider Last Rate Last Dose  . denosumab (PROLIA) injection 60 mg  60 mg Subcutaneous Q6 months Susy Frizzle, MD   60 mg at 08/02/17 1617   No Known Allergies Social History   Socioeconomic History  . Marital status: Married    Spouse name: Not on file  . Number of children: Not on file  . Years of education: Not on file  . Highest education level: Not on file  Occupational History  . Not on file  Social Needs  . Financial resource strain: Not on file  . Food insecurity:    Worry: Not on file    Inability: Not on file  . Transportation needs:    Medical: Not on file    Non-medical: Not on file  Tobacco Use  . Smoking  status: Never Smoker  . Smokeless tobacco: Never Used  Substance and Sexual Activity  . Alcohol use: Yes    Comment: occ  . Drug use: No  . Sexual activity: Yes    Comment: Married to [place], retired.  Lifestyle  . Physical activity:    Days per week: Not on file    Minutes per session: Not on file  . Stress: Not on file  Relationships  . Social connections:    Talks on phone: Not on file  Gets together: Not on file    Attends religious service: Not on file    Active member of club or organization: Not on file    Attends meetings of clubs or organizations: Not on file    Relationship status: Not on file  . Intimate partner violence:    Fear of current or ex partner: Not on file    Emotionally abused: Not on file    Physically abused: Not on file    Forced sexual activity: Not on file  Other Topics Concern  . Not on file  Social History Narrative  . Not on file      Review of Systems  All other systems reviewed and are negative.      Objective:   Physical Exam  Constitutional: She appears well-developed and well-nourished.  Cardiovascular: Normal rate, regular rhythm and normal heart sounds.  Pulmonary/Chest: Effort normal and breath sounds normal. No respiratory distress. She has no wheezes. She has no rales.  Abdominal: Soft. Bowel sounds are normal.  Vitals reviewed.       Assessment & Plan:  Hyponatremia - Plan: BASIC METABOLIC PANEL WITH GFR Blood pressure is now acceptable on amlodipine.  I asked the patient to monitor her blood pressure daily.  If consistently greater than 150, I would add more medication but if she can keep her systolic blood pressures in the 140s I believe this is acceptable.  I will recheck a BMP to monitor her hyponatremia.  If persistent, begin a work-up for SIADH by obtaining a chest x-ray and consider a head CT

## 2017-11-20 DIAGNOSIS — L570 Actinic keratosis: Secondary | ICD-10-CM | POA: Diagnosis not present

## 2017-11-20 DIAGNOSIS — X32XXXA Exposure to sunlight, initial encounter: Secondary | ICD-10-CM | POA: Diagnosis not present

## 2017-11-20 DIAGNOSIS — Z85828 Personal history of other malignant neoplasm of skin: Secondary | ICD-10-CM | POA: Diagnosis not present

## 2017-11-20 DIAGNOSIS — D2262 Melanocytic nevi of left upper limb, including shoulder: Secondary | ICD-10-CM | POA: Diagnosis not present

## 2017-11-20 DIAGNOSIS — L538 Other specified erythematous conditions: Secondary | ICD-10-CM | POA: Diagnosis not present

## 2017-11-20 DIAGNOSIS — D225 Melanocytic nevi of trunk: Secondary | ICD-10-CM | POA: Diagnosis not present

## 2017-11-20 DIAGNOSIS — L820 Inflamed seborrheic keratosis: Secondary | ICD-10-CM | POA: Diagnosis not present

## 2017-11-20 DIAGNOSIS — L661 Lichen planopilaris: Secondary | ICD-10-CM | POA: Diagnosis not present

## 2017-11-20 DIAGNOSIS — H61032 Chondritis of left external ear: Secondary | ICD-10-CM | POA: Diagnosis not present

## 2017-12-24 DIAGNOSIS — Z86010 Personal history of colonic polyps: Secondary | ICD-10-CM | POA: Diagnosis not present

## 2017-12-24 DIAGNOSIS — Z85038 Personal history of other malignant neoplasm of large intestine: Secondary | ICD-10-CM | POA: Diagnosis not present

## 2017-12-24 DIAGNOSIS — Z800 Family history of malignant neoplasm of digestive organs: Secondary | ICD-10-CM | POA: Diagnosis not present

## 2017-12-24 DIAGNOSIS — Z6822 Body mass index (BMI) 22.0-22.9, adult: Secondary | ICD-10-CM | POA: Diagnosis not present

## 2017-12-24 DIAGNOSIS — Z1272 Encounter for screening for malignant neoplasm of vagina: Secondary | ICD-10-CM | POA: Diagnosis not present

## 2017-12-24 DIAGNOSIS — Z124 Encounter for screening for malignant neoplasm of cervix: Secondary | ICD-10-CM | POA: Diagnosis not present

## 2018-01-14 ENCOUNTER — Other Ambulatory Visit: Payer: PPO

## 2018-01-16 ENCOUNTER — Ambulatory Visit: Payer: PPO | Admitting: Family Medicine

## 2018-02-05 ENCOUNTER — Ambulatory Visit: Payer: PPO

## 2018-02-06 ENCOUNTER — Ambulatory Visit (INDEPENDENT_AMBULATORY_CARE_PROVIDER_SITE_OTHER): Payer: PPO | Admitting: Family Medicine

## 2018-02-06 DIAGNOSIS — M810 Age-related osteoporosis without current pathological fracture: Secondary | ICD-10-CM

## 2018-02-06 MED ORDER — DENOSUMAB 60 MG/ML ~~LOC~~ SOSY
60.0000 mg | PREFILLED_SYRINGE | Freq: Once | SUBCUTANEOUS | Status: AC
  Administered 2018-02-06: 60 mg via SUBCUTANEOUS

## 2018-02-24 ENCOUNTER — Other Ambulatory Visit: Payer: Self-pay | Admitting: Family Medicine

## 2018-02-26 ENCOUNTER — Other Ambulatory Visit: Payer: PPO

## 2018-02-27 ENCOUNTER — Other Ambulatory Visit: Payer: PPO

## 2018-02-28 ENCOUNTER — Other Ambulatory Visit: Payer: PPO

## 2018-03-03 ENCOUNTER — Other Ambulatory Visit: Payer: PPO

## 2018-03-03 DIAGNOSIS — E7800 Pure hypercholesterolemia, unspecified: Secondary | ICD-10-CM

## 2018-03-03 DIAGNOSIS — E039 Hypothyroidism, unspecified: Secondary | ICD-10-CM

## 2018-03-03 DIAGNOSIS — I10 Essential (primary) hypertension: Secondary | ICD-10-CM

## 2018-03-04 LAB — CBC WITH DIFFERENTIAL/PLATELET
Basophils Absolute: 68 cells/uL (ref 0–200)
Basophils Relative: 0.9 %
EOS ABS: 152 {cells}/uL (ref 15–500)
Eosinophils Relative: 2 %
HEMATOCRIT: 40.7 % (ref 35.0–45.0)
Hemoglobin: 13.4 g/dL (ref 11.7–15.5)
Lymphs Abs: 2295 cells/uL (ref 850–3900)
MCH: 28.3 pg (ref 27.0–33.0)
MCHC: 32.9 g/dL (ref 32.0–36.0)
MCV: 85.9 fL (ref 80.0–100.0)
MPV: 11.3 fL (ref 7.5–12.5)
Monocytes Relative: 7 %
NEUTROS PCT: 59.9 %
Neutro Abs: 4552 cells/uL (ref 1500–7800)
PLATELETS: 248 10*3/uL (ref 140–400)
RBC: 4.74 10*6/uL (ref 3.80–5.10)
RDW: 12.7 % (ref 11.0–15.0)
TOTAL LYMPHOCYTE: 30.2 %
WBC: 7.6 10*3/uL (ref 3.8–10.8)
WBCMIX: 532 {cells}/uL (ref 200–950)

## 2018-03-04 LAB — LIPID PANEL
CHOL/HDL RATIO: 3.4 (calc) (ref <5.0)
CHOLESTEROL: 218 mg/dL — AB (ref <200)
HDL: 64 mg/dL (ref >50)
LDL CHOLESTEROL (CALC): 137 mg/dL — AB
NON-HDL CHOLESTEROL (CALC): 154 mg/dL — AB (ref <130)
Triglycerides: 75 mg/dL (ref <150)

## 2018-03-04 LAB — COMPREHENSIVE METABOLIC PANEL
AG Ratio: 1.4 (calc) (ref 1.0–2.5)
ALBUMIN MSPROF: 4.1 g/dL (ref 3.6–5.1)
ALT: 11 U/L (ref 6–29)
AST: 18 U/L (ref 10–35)
Alkaline phosphatase (APISO): 59 U/L (ref 33–130)
BUN: 13 mg/dL (ref 7–25)
CHLORIDE: 103 mmol/L (ref 98–110)
CO2: 33 mmol/L — ABNORMAL HIGH (ref 20–32)
CREATININE: 0.72 mg/dL (ref 0.60–0.88)
Calcium: 9.6 mg/dL (ref 8.6–10.4)
GLOBULIN: 3 g/dL (ref 1.9–3.7)
GLUCOSE: 84 mg/dL (ref 65–99)
POTASSIUM: 3.9 mmol/L (ref 3.5–5.3)
SODIUM: 142 mmol/L (ref 135–146)
TOTAL PROTEIN: 7.1 g/dL (ref 6.1–8.1)
Total Bilirubin: 0.4 mg/dL (ref 0.2–1.2)

## 2018-03-04 LAB — TSH: TSH: 1.65 m[IU]/L (ref 0.40–4.50)

## 2018-03-06 ENCOUNTER — Ambulatory Visit (INDEPENDENT_AMBULATORY_CARE_PROVIDER_SITE_OTHER): Payer: PPO | Admitting: Family Medicine

## 2018-03-06 VITALS — BP 150/80 | HR 72 | Temp 97.9°F | Resp 16 | Wt 126.0 lb

## 2018-03-06 DIAGNOSIS — Z1239 Encounter for other screening for malignant neoplasm of breast: Secondary | ICD-10-CM | POA: Diagnosis not present

## 2018-03-06 DIAGNOSIS — Z23 Encounter for immunization: Secondary | ICD-10-CM | POA: Diagnosis not present

## 2018-03-06 DIAGNOSIS — E039 Hypothyroidism, unspecified: Secondary | ICD-10-CM

## 2018-03-06 DIAGNOSIS — E7800 Pure hypercholesterolemia, unspecified: Secondary | ICD-10-CM

## 2018-03-06 DIAGNOSIS — I10 Essential (primary) hypertension: Secondary | ICD-10-CM | POA: Diagnosis not present

## 2018-03-06 MED ORDER — ESOMEPRAZOLE MAGNESIUM 40 MG PO CPDR: 40.0000 mg | DELAYED_RELEASE_CAPSULE | Freq: Every day | ORAL | 3 refills | Status: DC

## 2018-03-06 NOTE — Addendum Note (Signed)
Addended by: Shary Decamp B on: 03/06/2018 03:43 PM   Modules accepted: Orders

## 2018-03-06 NOTE — Progress Notes (Signed)
Subjective:    Patient ID: Joanne Gomez, female    DOB: 10-Sep-1930, 84 y.o.   MRN: [id number]  HPI  08/02/17 Patient's blood pressure has been elevated between 150 and 160/80-90 at home. She denies any chest pain shortness of breath or dyspnea on exertion but she is now willing to except that she needs medication for high blood pressure and is here today to discuss treatment options.  At that time, my plan was: Begin lisinopril HCTZ 20/12.5 one by mouth daily and recheck blood pressure and BMP in 1 month  09/03/17 Patient's blood pressure has been excellent since starting the new medication.  She comes in today with almost 20 different readings that she is taken over the last month.  The vast majority are averaging between 123 and 145/66-82.  She denies any side effects on the medication typically she denies any cough.  At that time, my plan was: I am very happy with her blood pressure.  We will make no changes in her medication.  I will check a BMP to monitor her renal function and potassium.  If lab work is stable, I would plan on seeing the patient again in 6 months or as needed  09/16/17 After I saw the patient last time, her lab work returned showing hyponatremia.  This was confirmed on a repeat sample.  Patient has no history of hyponatremia.  She has been restricting her fluids as directed.  She is here today to recheck a BMP.  Her blood pressure is elevated here however her blood pressures at home are on average excellent.  They range from 035-465 systolic but the vast majority are 120-145/70-84.  He denies any side effects from the medication as far she can tells.  Unfortunately she fell on Wednesday striking the right temple on the ground.  This forced her glasses which she was wearing at the time into her face bruising her cheeks and periorbital area bilaterally.  She has raccoon eyes today on exam.  She denies any loss of consciousness.  She denies any neurologic deficits.  She denies any  blurry vision.  She denies any nausea or vomiting or other symptoms of increased intracranial pressure.  At that time, my plan was:  I have very little suspicion for a basilar skull fracture.  I believe the bruises are a result of her glasses when she fell.  There is no sign of intracranial hemorrhage today on exam.  I will recheck a BMP.  If sodium remains low, I will change medication most likely to amlodipine.  10/03/17 Bruising has improved dramatically has almost essentially faded.  Her blood pressure at home is consistently between 681 and 275 systolic.  Several days is in the 130s.  Given her age, I believe that this is acceptable.  She is tolerating the amlodipine without any difficulty.  She is here today mainly to recheck her sodium as I believe her hyponatremia was related to the lisinopril HCTZ.  She denies any dizziness or headaches or nausea or vomiting.  At that time, my plan was: Blood pressure is now acceptable on amlodipine.  I asked the patient to monitor her blood pressure daily.  If consistently greater than 150, I would add more medication but if she can keep her systolic blood pressures in the 140s I believe this is acceptable.  I will recheck a BMP to monitor her hyponatremia.  If persistent, begin a work-up for SIADH by obtaining a chest x-ray and consider a head  CT   03/06/18 Patient is here today for follow-up.  Her most recent lab work is listed below: Lab on 03/03/2018  Component Date Value Ref Range Status  . Glucose, Bld 03/03/2018 84  65 - 99 mg/dL Final   Comment: .            Fasting reference interval .   . BUN 03/03/2018 13  7 - 25 mg/dL Final  . Creat 03/03/2018 0.72  0.60 - 0.88 mg/dL Final   Comment: For patients >24 years of age, the reference limit for Creatinine is approximately 13% higher for people identified as African-American. .   Havery Moros Ratio 59/56/3875 NOT APPLICABLE  6 - 22 (calc) Final  . Sodium 03/03/2018 142  135 - 146 mmol/L  Final  . Potassium 03/03/2018 3.9  3.5 - 5.3 mmol/L Final  . Chloride 03/03/2018 103  98 - 110 mmol/L Final  . CO2 03/03/2018 33* 20 - 32 mmol/L Final  . Calcium 03/03/2018 9.6  8.6 - 10.4 mg/dL Final  . Total Protein 03/03/2018 7.1  6.1 - 8.1 g/dL Final  . Albumin 03/03/2018 4.1  3.6 - 5.1 g/dL Final  . Globulin 03/03/2018 3.0  1.9 - 3.7 g/dL (calc) Final  . AG Ratio 03/03/2018 1.4  1.0 - 2.5 (calc) Final  . Total Bilirubin 03/03/2018 0.4  0.2 - 1.2 mg/dL Final  . Alkaline phosphatase (APISO) 03/03/2018 59  33 - 130 U/L Final  . AST 03/03/2018 18  10 - 35 U/L Final  . ALT 03/03/2018 11  6 - 29 U/L Final  . Cholesterol 03/03/2018 218* <200 mg/dL Final  . HDL 03/03/2018 64  >50 mg/dL Final  . Triglycerides 03/03/2018 75  <150 mg/dL Final  . LDL Cholesterol (Calc) 03/03/2018 137* mg/dL (calc) Final   Comment: Reference range: <100 . Desirable range <100 mg/dL for primary prevention;   <70 mg/dL for patients with CHD or diabetic patients  with > or = 2 CHD risk factors. Marland Kitchen LDL-C is now calculated using the Martin-Hopkins  calculation, which is a validated novel method providing  better accuracy than the Friedewald equation in the  estimation of LDL-C.  Cresenciano Genre et al. Annamaria Helling. 6433;295(18): 2061-2068  (http://education.[facility].com/faq/FAQ164)   . Total CHOL/HDL Ratio 03/03/2018 3.4  <5.0 (calc) Final  . Non-HDL Cholesterol (Calc) 03/03/2018 154* <130 mg/dL (calc) Final   Comment: For patients with diabetes plus 1 major ASCVD risk  factor, treating to a non-HDL-C goal of <100 mg/dL  (LDL-C of <70 mg/dL) is considered a therapeutic  option.   . WBC 03/03/2018 7.6  3.8 - 10.8 Thousand/uL Final  . RBC 03/03/2018 4.74  3.80 - 5.10 Million/uL Final  . Hemoglobin 03/03/2018 13.4  11.7 - 15.5 g/dL Final  . HCT 03/03/2018 40.7  35.0 - 45.0 % Final  . MCV 03/03/2018 85.9  80.0 - 100.0 fL Final  . MCH 03/03/2018 28.3  27.0 - 33.0 pg Final  . MCHC 03/03/2018 32.9  32.0 - 36.0 g/dL  Final  . RDW 03/03/2018 12.7  11.0 - 15.0 % Final  . Platelets 03/03/2018 248  140 - 400 Thousand/uL Final  . MPV 03/03/2018 11.3  7.5 - 12.5 fL Final  . Neutro Abs 03/03/2018 4,552  1,500 - 7,800 cells/uL Final  . Lymphs Abs 03/03/2018 2,295  850 - 3,900 cells/uL Final  . WBC mixed population 03/03/2018 532  200 - 950 cells/uL Final  . Eosinophils Absolute 03/03/2018 152  15 - 500 cells/uL Final  . Basophils  Absolute 03/03/2018 68  0 - 200 cells/uL Final  . Neutrophils Relative % 03/03/2018 59.9  % Final  . Total Lymphocyte 03/03/2018 30.2  % Final  . Monocytes Relative 03/03/2018 7.0  % Final  . Eosinophils Relative 03/03/2018 2.0  % Final  . Basophils Relative 03/03/2018 0.9  % Final  . TSH 03/03/2018 1.65  0.40 - 4.50 mIU/L Final   Aside from her cholesterol, her labs are outstanding.  Patient has done poorly on statins in the past and declines any treatment for her cholesterol.  At this point, given her 5-year life expectancy, I do not believe that initiating statin therapy at this point will be extremely beneficial for this patient and therefore I will agree with her not to start cholesterol medication.  Her thyroid is currently well controlled on Armour Thyroid.  She discontinue pantoprazole due to concerns over interstitial nephritis however she is now having breakthrough acid reflux.  She would like to go back on the Nexium she was previously taking.  She is reporting some mild age-related memory problems.  She states at times she is having a difficult time thinking of the words she wants to say.  She denies any apraxia.  She denies any agnosia.  She denies any problems managing her finances or performing IADLs.  At the present time the word finding problems are minor and infrequent.  Otherwise she is doing relatively well.  She denies any chest pain shortness of breath or dyspnea on exertion.  Her blood pressures are marginal but the majority are in the 106Y systolic.  Diastolic blood  pressures always well controlled.  She is due today for her flu shot Past Medical History:  Diagnosis Date  . Cancer ([place]) 11/2007   colon stage II  . Diverticula of colon   . GERD (gastroesophageal reflux disease)   . H/O colon cancer, stage II 11/15/2011  . HH (hiatus hernia)   . History of hiatal hernia   . HOH (hard of hearing)    MILD LOSS  . Hyperlipidemia   . Hypothyroidism   . Osteoporosis   . Pneumonia    Past Surgical History:  Procedure Laterality Date  . ABDOMINAL HYSTERECTOMY    . APPENDECTOMY    . CATARACT EXTRACTION W/PHACO Right 08/30/2015   Procedure: CATARACT EXTRACTION PHACO AND INTRAOCULAR LENS PLACEMENT (IOC);  Surgeon: Birder Robson, MD;  Location: [facility] ORS;  Service: Ophthalmology;  Laterality: Right;   [place]    00:40.8 AP%  15.7 CDE    6.42   fluid pack lot #[id number] H  exp 02/24/2017  . CATARACT EXTRACTION W/PHACO Left 09/20/2015   Procedure: CATARACT EXTRACTION PHACO AND INTRAOCULAR LENS PLACEMENT (IOC);  Surgeon: Birder Robson, MD;  Location: [facility] ORS;  Service: Ophthalmology;  Laterality: Left;  [place] 44.8 AP% 19.6 CDE 8.77 FLUID PACK LOT # [id number]  . CHOLECYSTECTOMY    . ELBOW SURGERY     right   . hemocolectomy  2009  . VAGINAL PROLAPSE REPAIR     Current Outpatient Medications on File Prior to Visit  Medication Sig Dispense Refill  . amLODipine (NORVASC) 10 MG tablet Take 1 tablet (10 mg total) by mouth daily. 90 tablet 3  . ARMOUR THYROID 60 MG tablet TAKE ONE TABLET BY MOUTH EVERY MORNING BEFORE BREAKFAST 90 tablet 3  . CALCIUM CITRATE PO Take 1 tablet by mouth 2 (two) times daily. 630mg     . Cholecalciferol (VITAMIN D3) 2000 units TABS Take 1 tablet by mouth daily.    Marland Kitchen  denosumab (PROLIA) 60 MG/ML SOLN injection Inject 60 mg into the skin every 6 (six) months. Administer in upper arm, thigh, or abdomen    . linaclotide (LINZESS) 145 MCG CAPS capsule Take 1 capsule (145 mcg total) by mouth daily before breakfast. (Patient taking differently: Take  145 mcg by mouth as needed. ) 30 capsule 5  . Magnesium 250 MG TABS Take 1 tablet by mouth daily.    . Multiple Vitamin (MULTIVITAMIN) tablet Take 1 tablet by mouth daily.      Marland Kitchen nystatin cream (MYCOSTATIN) Apply 1 application topically 2 (two) times daily.     . pantoprazole (PROTONIX) 40 MG tablet Take 1 tablet (40 mg total) by mouth daily. (Patient not taking: Reported on 03/06/2018) 90 tablet 3   Current Facility-Administered Medications on File Prior to Visit  Medication Dose Route Frequency Provider Last Rate Last Dose  . denosumab (PROLIA) injection 60 mg  60 mg Subcutaneous Q6 months Susy Frizzle, MD   60 mg at 08/02/17 1617   No Known Allergies Social History   Socioeconomic History  . Marital status: Married    Spouse name: Not on file  . Number of children: Not on file  . Years of education: Not on file  . Highest education level: Not on file  Occupational History  . Not on file  Social Needs  . Financial resource strain: Not on file  . Food insecurity:    Worry: Not on file    Inability: Not on file  . Transportation needs:    Medical: Not on file    Non-medical: Not on file  Tobacco Use  . Smoking status: Never Smoker  . Smokeless tobacco: Never Used  Substance and Sexual Activity  . Alcohol use: Yes    Comment: occ  . Drug use: No  . Sexual activity: Yes    Comment: Married to [place], retired.  Lifestyle  . Physical activity:    Days per week: Not on file    Minutes per session: Not on file  . Stress: Not on file  Relationships  . Social connections:    Talks on phone: Not on file    Gets together: Not on file    Attends religious service: Not on file    Active member of club or organization: Not on file    Attends meetings of clubs or organizations: Not on file    Relationship status: Not on file  . Intimate partner violence:    Fear of current or ex partner: Not on file    Emotionally abused: Not on file    Physically abused: Not on file     Forced sexual activity: Not on file  Other Topics Concern  . Not on file  Social History Narrative  . Not on file      Review of Systems  All other systems reviewed and are negative.      Objective:   Physical Exam  Constitutional: She is oriented to person, place, and time. She appears well-developed and well-nourished. No distress.  Eyes: Pupils are equal, round, and reactive to light. Conjunctivae and EOM are normal.  Neck: No JVD present. No thyromegaly present.  Cardiovascular: Normal rate, regular rhythm and normal heart sounds. Exam reveals no gallop and no friction rub.  No murmur heard. Pulmonary/Chest: Effort normal and breath sounds normal. No stridor. No respiratory distress. She has no wheezes. She has no rales. She exhibits no tenderness.  Abdominal: Soft. Bowel sounds are normal. She  exhibits no distension and no mass. There is no tenderness. There is no rebound and no guarding.  Lymphadenopathy:    She has no cervical adenopathy.  Neurological: She is alert and oriented to person, place, and time. She displays normal reflexes. No cranial nerve deficit. She exhibits normal muscle tone. Coordination normal.  Skin: She is not diaphoretic.  Vitals reviewed.       Assessment & Plan:  Breast cancer screening - Plan: MM Digital Screening  Essential hypertension  Pure hypercholesterolemia  Hypothyroidism, unspecified type Patient is requesting a mammogram.  I will schedule this for her.  Thyroid is currently well controlled.  I will make no dose adjustments and Armour Thyroid.  Cholesterol is elevated but the patient declines statin therapy at this point.  Given her 5-year life expectancy, I would agree with the patient and not push the issue of starting cholesterol medication for primary prevention.  Blood pressure today is slightly elevated however the patient states that her blood pressures at home are better controlled.  Therefore I will make no adjustments in her  medications at this time.  She received a high-dose flu vaccine today.  We discussed her memory loss and at the present time we have agreed to monitor this closely.  If she experiences any additional memory problems, we can pursue further testing at that point

## 2018-03-21 ENCOUNTER — Encounter: Payer: Self-pay | Admitting: Family Medicine

## 2018-03-21 ENCOUNTER — Ambulatory Visit (INDEPENDENT_AMBULATORY_CARE_PROVIDER_SITE_OTHER): Payer: PPO | Admitting: Family Medicine

## 2018-03-21 VITALS — BP 126/74 | HR 94 | Temp 98.3°F | Resp 14 | Ht 62.5 in | Wt 125.0 lb

## 2018-03-21 DIAGNOSIS — N3090 Cystitis, unspecified without hematuria: Secondary | ICD-10-CM

## 2018-03-21 DIAGNOSIS — R300 Dysuria: Secondary | ICD-10-CM

## 2018-03-21 LAB — URINALYSIS, ROUTINE W REFLEX MICROSCOPIC
BILIRUBIN URINE: NEGATIVE
Glucose, UA: NEGATIVE
Ketones, ur: NEGATIVE
Nitrite: NEGATIVE
PH: 7 (ref 5.0–8.0)
Protein, ur: NEGATIVE
SPECIFIC GRAVITY, URINE: 1.01 (ref 1.001–1.03)

## 2018-03-21 LAB — MICROSCOPIC MESSAGE

## 2018-03-21 MED ORDER — SULFAMETHOXAZOLE-TRIMETHOPRIM 800-160 MG PO TABS: 1.0000 | ORAL_TABLET | Freq: Two times a day (BID) | ORAL | 0 refills | Status: DC

## 2018-03-21 NOTE — Progress Notes (Signed)
Subjective:    Patient ID: Joanne Gomez, female    DOB: 12/17/1930, 84 y.o.   MRN: [id number]  HPI  Patient reports a one-week history of dysuria.  It burns every time she urinates.  She also reports pressure-like discomfort in her bladder area.  She reports lancing pain in her bladder associated with urination.  She reports increased frequency as well as increased urgency.  Urinalysis today shows +2 blood and +2 leukocyte esterase.  She denies any fever.  She denies any chills.  She denies any nausea or vomiting.  She denies any CVA tenderness.  She denies any constipation or diarrhea or melena or hematochezia Past Medical History:  Diagnosis Date  . Cancer ([place]) 11/2007   colon stage II  . Diverticula of colon   . GERD (gastroesophageal reflux disease)   . H/O colon cancer, stage II 11/15/2011  . HH (hiatus hernia)   . History of hiatal hernia   . HOH (hard of hearing)    MILD LOSS  . Hyperlipidemia   . Hypothyroidism   . Osteoporosis   . Pneumonia    Past Surgical History:  Procedure Laterality Date  . ABDOMINAL HYSTERECTOMY    . APPENDECTOMY    . CATARACT EXTRACTION W/PHACO Right 08/30/2015   Procedure: CATARACT EXTRACTION PHACO AND INTRAOCULAR LENS PLACEMENT (IOC);  Surgeon: Birder Robson, MD;  Location: [facility] ORS;  Service: Ophthalmology;  Laterality: Right;   [place]    00:40.8 AP%  15.7 CDE    6.42   fluid pack lot #[id number]  exp 02/24/2017  . CATARACT EXTRACTION W/PHACO Left 09/20/2015   Procedure: CATARACT EXTRACTION PHACO AND INTRAOCULAR LENS PLACEMENT (IOC);  Surgeon: Birder Robson, MD;  Location: [facility];  Service: Ophthalmology;  Laterality: Left;  [place] 44.8 AP% 19.6 CDE 8.77 FLUID PACK LOT # [id number]  . CHOLECYSTECTOMY    . ELBOW SURGERY     right   . hemocolectomy  2009  . VAGINAL PROLAPSE REPAIR     Current Outpatient Medications on File Prior to Visit  Medication Sig Dispense Refill  . amLODipine (NORVASC) 10 MG tablet Take 1 tablet (10 mg total) by mouth  daily. 90 tablet 3  . ARMOUR THYROID 60 MG tablet TAKE ONE TABLET BY MOUTH EVERY MORNING BEFORE BREAKFAST 90 tablet 3  . CALCIUM CITRATE PO Take 1 tablet by mouth 2 (two) times daily. 630mg     . Cholecalciferol (VITAMIN D3) 2000 units TABS Take 1 tablet by mouth daily.    Marland Kitchen denosumab (PROLIA) 60 MG/ML SOLN injection Inject 60 mg into the skin every 6 (six) months. Administer in upper arm, thigh, or abdomen    . esomeprazole (NEXIUM) 40 MG capsule Take 1 capsule (40 mg total) by mouth daily. 90 capsule 3  . Magnesium 250 MG TABS Take 1 tablet by mouth daily.    . Multiple Vitamin (MULTIVITAMIN) tablet Take 1 tablet by mouth daily.      Marland Kitchen nystatin cream (MYCOSTATIN) Apply 1 application topically 2 (two) times daily.      Current Facility-Administered Medications on File Prior to Visit  Medication Dose Route Frequency Provider Last Rate Last Dose  . denosumab (PROLIA) injection 60 mg  60 mg Subcutaneous Q6 months Susy Frizzle, MD   60 mg at 08/02/17 1617   No Known Allergies Social History   Socioeconomic History  . Marital status: Married    Spouse name: Not on file  . Number of children: Not on file  . Years of education:  Not on file  . Highest education level: Not on file  Occupational History  . Not on file  Social Needs  . Financial resource strain: Not on file  . Food insecurity:    Worry: Not on file    Inability: Not on file  . Transportation needs:    Medical: Not on file    Non-medical: Not on file  Tobacco Use  . Smoking status: Never Smoker  . Smokeless tobacco: Never Used  Substance and Sexual Activity  . Alcohol use: Yes    Comment: occ  . Drug use: No  . Sexual activity: Yes    Comment: Married to [place], retired.  Lifestyle  . Physical activity:    Days per week: Not on file    Minutes per session: Not on file  . Stress: Not on file  Relationships  . Social connections:    Talks on phone: Not on file    Gets together: Not on file    Attends religious  service: Not on file    Active member of club or organization: Not on file    Attends meetings of clubs or organizations: Not on file    Relationship status: Not on file  . Intimate partner violence:    Fear of current or ex partner: Not on file    Emotionally abused: Not on file    Physically abused: Not on file    Forced sexual activity: Not on file  Other Topics Concern  . Not on file  Social History Narrative  . Not on file     Review of Systems  All other systems reviewed and are negative.      Objective:   Physical Exam  Cardiovascular: Normal rate, regular rhythm and normal heart sounds.  Pulmonary/Chest: Effort normal and breath sounds normal.  Abdominal: Soft. Normal appearance and bowel sounds are normal. There is no tenderness. There is no rigidity, no rebound, no guarding and no CVA tenderness.  Vitals reviewed.         Assessment & Plan:  Dysuria - Plan: Urinalysis, Routine w reflex microscopic  Cystitis  I believe the patient has cystitis.  Begin Bactrim double strength tablets 1 p.o. twice daily for 5 days.  Recheck next week if no better.  I encouraged increased consumption of fluids.

## 2018-05-27 ENCOUNTER — Other Ambulatory Visit: Payer: Self-pay | Admitting: Family Medicine

## 2018-06-06 ENCOUNTER — Encounter: Payer: Self-pay | Admitting: Family Medicine

## 2018-06-06 ENCOUNTER — Ambulatory Visit (INDEPENDENT_AMBULATORY_CARE_PROVIDER_SITE_OTHER): Payer: PPO | Admitting: Family Medicine

## 2018-06-06 VITALS — BP 110/70 | HR 82 | Temp 97.6°F | Resp 12 | Ht 62.5 in | Wt 121.0 lb

## 2018-06-06 DIAGNOSIS — E860 Dehydration: Secondary | ICD-10-CM

## 2018-06-06 DIAGNOSIS — H353131 Nonexudative age-related macular degeneration, bilateral, early dry stage: Secondary | ICD-10-CM | POA: Diagnosis not present

## 2018-06-06 DIAGNOSIS — J019 Acute sinusitis, unspecified: Secondary | ICD-10-CM | POA: Diagnosis not present

## 2018-06-06 DIAGNOSIS — B9689 Other specified bacterial agents as the cause of diseases classified elsewhere: Secondary | ICD-10-CM | POA: Diagnosis not present

## 2018-06-06 MED ORDER — FLUTICASONE PROPIONATE 50 MCG/ACT NA SUSP: 2.0000 | Freq: Every day | NASAL | 6 refills | Status: DC

## 2018-06-06 MED ORDER — AMOXICILLIN 875 MG PO TABS: 875.0000 mg | ORAL_TABLET | Freq: Two times a day (BID) | ORAL | 0 refills | Status: DC

## 2018-06-06 NOTE — Progress Notes (Signed)
Subjective:    Patient ID: Joanne Gomez, female    DOB: [birth year], 85 y.o.   MRN: [id number]  HPI  Patient symptoms began 2 weeks ago.  Symptoms include head congestion, rhinorrhea, sinus headache in her frontal and maxillary sinuses bilaterally, postnasal drip causing a sore throat and nonproductive cough, objective fevers to 100.6.  She also reports feeling weak and tired.  Over the last few days she is also developed diarrhea.  She has 1-2 loose bowel movements a day.  Her appetite has been poor.  She is also feeling weak and tired.  Her blood pressure is low given her previous blood pressures. Wt Readings from Last 3 Encounters:  06/06/18 121 lb (54.9 kg)  03/21/18 125 lb (56.7 kg)  03/06/18 126 lb (57.2 kg)  Her weight is down 4 pounds from her previous visit  Past Medical History:  Diagnosis Date  . Cancer ([place]) 11/2007   colon stage II  . Diverticula of colon   . GERD (gastroesophageal reflux disease)   . H/O colon cancer, stage II 11/15/2011  . HH (hiatus hernia)   . History of hiatal hernia   . HOH (hard of hearing)    MILD LOSS  . Hyperlipidemia   . Hypothyroidism   . Osteoporosis   . Pneumonia    Past Surgical History:  Procedure Laterality Date  . ABDOMINAL HYSTERECTOMY    . APPENDECTOMY    . CATARACT EXTRACTION W/PHACO Right 08/30/2015   Procedure: CATARACT EXTRACTION PHACO AND INTRAOCULAR LENS PLACEMENT (IOC);  Surgeon: Birder Robson, MD;  Location: [facility];  Service: Ophthalmology;  Laterality: Right;   [place]    00:40.8 AP%  15.7 CDE    6.42   fluid pack lot #[id number] H  exp 02/24/2017  . CATARACT EXTRACTION W/PHACO Left 09/20/2015   Procedure: CATARACT EXTRACTION PHACO AND INTRAOCULAR LENS PLACEMENT (IOC);  Surgeon: Birder Robson, MD;  Location: [facility];  Service: Ophthalmology;  Laterality: Left;  [place] 44.8 AP% 19.6 CDE 8.77 FLUID PACK LOT # [id number]  . CHOLECYSTECTOMY    . ELBOW SURGERY     right   . hemocolectomy  2009  . VAGINAL PROLAPSE REPAIR       Current Outpatient Medications on File Prior to Visit  Medication Sig Dispense Refill  . amLODipine (NORVASC) 10 MG tablet TAKE ONE TABLET BY MOUTH EVERY DAY ( DISCONTINUE LISINOPRIL HCTZ ) 90 tablet 3  . ARMOUR THYROID 60 MG tablet TAKE ONE TABLET BY MOUTH EVERY MORNING BEFORE BREAKFAST 90 tablet 3  . CALCIUM CITRATE PO Take 1 tablet by mouth 2 (two) times daily. 630mg     . Cholecalciferol (VITAMIN D3) 2000 units TABS Take 1 tablet by mouth daily.    Marland Kitchen denosumab (PROLIA) 60 MG/ML SOLN injection Inject 60 mg into the skin every 6 (six) months. Administer in upper arm, thigh, or abdomen    . esomeprazole (NEXIUM) 40 MG capsule Take 1 capsule (40 mg total) by mouth daily. 90 capsule 3  . Magnesium 250 MG TABS Take 1 tablet by mouth daily.    . Multiple Vitamin (MULTIVITAMIN) tablet Take 1 tablet by mouth daily.      Marland Kitchen nystatin cream (MYCOSTATIN) Apply 1 application topically 2 (two) times daily.     Marland Kitchen sulfamethoxazole-trimethoprim (BACTRIM DS,SEPTRA DS) 800-160 MG tablet Take 1 tablet by mouth 2 (two) times daily. 14 tablet 0   Current Facility-Administered Medications on File Prior to Visit  Medication Dose Route Frequency Provider Last Rate Last Dose  .  denosumab (PROLIA) injection 60 mg  60 mg Subcutaneous Q6 months Susy Frizzle, MD   60 mg at 08/02/17 1617   No Known Allergies Social History   Socioeconomic History  . Marital status: Married    Spouse name: Not on file  . Number of children: Not on file  . Years of education: Not on file  . Highest education level: Not on file  Occupational History  . Not on file  Social Needs  . Financial resource strain: Not on file  . Food insecurity:    Worry: Not on file    Inability: Not on file  . Transportation needs:    Medical: Not on file    Non-medical: Not on file  Tobacco Use  . Smoking status: Never Smoker  . Smokeless tobacco: Never Used  Substance and Sexual Activity  . Alcohol use: Yes    Comment: occ  . Drug  use: No  . Sexual activity: Yes    Comment: Married to [place], retired.  Lifestyle  . Physical activity:    Days per week: Not on file    Minutes per session: Not on file  . Stress: Not on file  Relationships  . Social connections:    Talks on phone: Not on file    Gets together: Not on file    Attends religious service: Not on file    Active member of club or organization: Not on file    Attends meetings of clubs or organizations: Not on file    Relationship status: Not on file  . Intimate partner violence:    Fear of current or ex partner: Not on file    Emotionally abused: Not on file    Physically abused: Not on file    Forced sexual activity: Not on file  Other Topics Concern  . Not on file  Social History Narrative  . Not on file     Review of Systems  All other systems reviewed and are negative.      Objective:   Physical Exam Vitals signs reviewed.  Constitutional:      General: She is not in acute distress.    Appearance: Normal appearance. She is not ill-appearing, toxic-appearing or diaphoretic.  HENT:     Right Ear: Tympanic membrane and ear canal normal.     Left Ear: Tympanic membrane and ear canal normal.     Nose: Nasal tenderness, mucosal edema, congestion and rhinorrhea present.     Right Sinus: Maxillary sinus tenderness and frontal sinus tenderness present.     Left Sinus: Maxillary sinus tenderness and frontal sinus tenderness present.     Mouth/Throat:     Mouth: Mucous membranes are moist.     Pharynx: Oropharynx is clear. No oropharyngeal exudate or posterior oropharyngeal erythema.  Eyes:     Conjunctiva/sclera: Conjunctivae normal.  Cardiovascular:     Rate and Rhythm: Normal rate and regular rhythm.     Heart sounds: Normal heart sounds.  Pulmonary:     Effort: Pulmonary effort is normal. No respiratory distress.     Breath sounds: Normal breath sounds. No stridor. No wheezing or rales.  Abdominal:     General: Bowel sounds are normal.      Palpations: Abdomen is soft. Abdomen is not rigid.     Tenderness: There is no abdominal tenderness. There is no guarding or rebound.  Lymphadenopathy:     Cervical: No cervical adenopathy.  Neurological:     Mental Status: She is  alert.           Assessment & Plan:  Bacterial rhinosinusitis, dehydration, relative hypotension  Discontinue amlodipine, push fluids.  Encourage the patient to drink 1-2 large Gatorade's every day for the next 2 to 3 days.  Resume amlodipine once her strength is returned to normal.  Meanwhile begin amoxicillin 875 mg p.o. twice daily for 10 days for sinusitis.  Eat a brat diet.  Use Imodium as needed for diarrhea.  Recheck next week if no better or sooner if worse

## 2018-06-19 ENCOUNTER — Ambulatory Visit
Admission: RE | Admit: 2018-06-19 | Discharge: 2018-06-19 | Disposition: A | Discharge status: Home / Self Care | Payer: PPO | Source: Ambulatory Visit | Attending: Family Medicine | Admitting: Family Medicine

## 2018-06-19 DIAGNOSIS — Z1231 Encounter for screening mammogram for malignant neoplasm of breast: Secondary | ICD-10-CM | POA: Insufficient documentation

## 2018-06-19 DIAGNOSIS — Z1239 Encounter for other screening for malignant neoplasm of breast: Secondary | ICD-10-CM

## 2018-07-01 ENCOUNTER — Encounter: Payer: Self-pay | Admitting: Family Medicine

## 2018-07-01 ENCOUNTER — Ambulatory Visit
Admission: RE | Admit: 2018-07-01 | Discharge: 2018-07-01 | Disposition: A | Discharge status: Home / Self Care | Payer: PPO | Attending: Family Medicine | Admitting: Family Medicine

## 2018-07-01 ENCOUNTER — Ambulatory Visit (INDEPENDENT_AMBULATORY_CARE_PROVIDER_SITE_OTHER): Payer: PPO | Admitting: Family Medicine

## 2018-07-01 ENCOUNTER — Ambulatory Visit
Admission: RE | Admit: 2018-07-01 | Discharge: 2018-07-01 | Disposition: A | Discharge status: Home / Self Care | Payer: PPO | Source: Ambulatory Visit | Attending: Family Medicine | Admitting: Family Medicine

## 2018-07-01 VITALS — BP 116/70 | HR 96 | Temp 98.2°F | Resp 15 | Ht 62.5 in | Wt 121.4 lb

## 2018-07-01 DIAGNOSIS — R109 Unspecified abdominal pain: Secondary | ICD-10-CM | POA: Diagnosis not present

## 2018-07-01 DIAGNOSIS — K921 Melena: Secondary | ICD-10-CM | POA: Diagnosis not present

## 2018-07-01 MED ORDER — FAMOTIDINE 20 MG PO TABS: 20.0000 mg | ORAL_TABLET | Freq: Two times a day (BID) | ORAL | 0 refills | Status: DC | PRN

## 2018-07-01 MED ORDER — PANTOPRAZOLE SODIUM 40 MG PO TBEC: 40.0000 mg | DELAYED_RELEASE_TABLET | Freq: Every day | ORAL | 3 refills | Status: DC

## 2018-07-01 NOTE — Patient Instructions (Signed)
Take protonix > 2 hours after your dinner   Pepcid - as needed when you have pain or indigestion  Go get your X-ray

## 2018-07-01 NOTE — Progress Notes (Addendum)
Patient ID: Joanne Gomez, female    DOB: April 22, 1931, 85 y.o.   MRN: [id number]  PCP: Susy Frizzle, MD  Chief Complaint  Patient presents with  . Rectal Bleeding    Patient in with c/o epigastric pain, generalized pain. Also has c/o rectal bleeding. Onset last night.    Subjective:   Joanne Gomez is a 85 y.o. female, presents to clinic with CC of concerned that she may have some bleeding per her rectum.  She woke up this morning, usually wears a pad in her undergarments, there was some blood on it it was located towards the back of the pad so she suspected that it may have come from her bottom.  She cannot tell if it was urinary or not.  She has had a few days of generalized epigastric pain she has not had any fever, chills, sweats, vomiting, nausea pain does not radiate to her back, pain is mild, feels slightly crampy and bloated.  She does have some indigestion, increased belching and acid reflux.  She says that she is not having any dysuria, urinary urgency, urinary frequency.  She denies diarrhea, constipation, melena, hematochezia.  In the toilet or while awake today no bright red blood per rectum noted with wiping.  No rectal pain no tenesmus     Patient Active Problem List   Diagnosis Date Noted  . Chest pain at rest 03/26/2014  . GERD (gastroesophageal reflux disease) 03/26/2014  . Essential hypertension 03/26/2014  . Osteoporosis   . Hypothyroidism   . H/O colon cancer, stage II 11/15/2011     Prior to Admission medications   Medication Sig Start Date End Date Taking? Authorizing Provider  amLODipine (NORVASC) 10 MG tablet TAKE ONE TABLET BY MOUTH EVERY DAY ( DISCONTINUE LISINOPRIL HCTZ ) 05/27/18  Yes Pickard, Cammie Mcgee, MD  ARMOUR THYROID 60 MG tablet TAKE ONE TABLET BY MOUTH EVERY MORNING BEFORE BREAKFAST 02/24/18  Yes Susy Frizzle, MD  CALCIUM CITRATE PO Take 1 tablet by mouth 2 (two) times daily. 630mg    Yes [provider]  Cholecalciferol  (VITAMIN D3) 2000 units TABS Take 1 tablet by mouth daily.   Yes [provider]  denosumab (PROLIA) 60 MG/ML SOLN injection Inject 60 mg into the skin every 6 (six) months. Administer in upper arm, thigh, or abdomen   Yes [provider]  esomeprazole (NEXIUM) 40 MG capsule Take 1 capsule (40 mg total) by mouth daily. 03/06/18  Yes Susy Frizzle, MD  fluticasone (FLONASE) 50 MCG/ACT nasal spray Place 2 sprays into both nostrils daily. 06/06/18  Yes Susy Frizzle, MD  Magnesium 250 MG TABS Take 1 tablet by mouth daily.   Yes [provider]  Multiple Vitamin (MULTIVITAMIN) tablet Take 1 tablet by mouth daily.     Yes [provider]     No Known Allergies   Family History  Problem Relation Age of Onset  . Heart attack Sister 61  . Hyperlipidemia Sister   . Hypertension Sister   . Breast cancer Neg Hx      Social History   Socioeconomic History  . Marital status: Married    Spouse name: Not on file  . Number of children: Not on file  . Years of education: Not on file  . Highest education level: Not on file  Occupational History  . Not on file  Social Needs  . Financial resource strain: Not on file  . Food insecurity:  Worry: Not on file    Inability: Not on file  . Transportation needs:    Medical: Not on file    Non-medical: Not on file  Tobacco Use  . Smoking status: Never Smoker  . Smokeless tobacco: Never Used  Substance and Sexual Activity  . Alcohol use: Yes    Comment: occ  . Drug use: No  . Sexual activity: Yes    Comment: Married to [place], retired.  Lifestyle  . Physical activity:    Days per week: Not on file    Minutes per session: Not on file  . Stress: Not on file  Relationships  . Social connections:    Talks on phone: Not on file    Gets together: Not on file    Attends religious service: Not on file    Active member of club or organization: Not on file    Attends meetings of clubs or organizations:  Not on file    Relationship status: Not on file  . Intimate partner violence:    Fear of current or ex partner: Not on file    Emotionally abused: Not on file    Physically abused: Not on file    Forced sexual activity: Not on file  Other Topics Concern  . Not on file  Social History Narrative  . Not on file     Review of Systems  Constitutional: Negative.   HENT: Negative.   Eyes: Negative.   Respiratory: Negative.   Cardiovascular: Negative.   Gastrointestinal: Negative.   Endocrine: Negative.   Genitourinary: Negative.   Musculoskeletal: Negative.   Skin: Negative.   Allergic/Immunologic: Negative.   Neurological: Negative.   Hematological: Negative.   Psychiatric/Behavioral: Negative.   All other systems reviewed and are negative.      Objective:    Vitals:   07/01/18 1408  BP: 116/70  Pulse: 96  Resp: 15  Temp: 98.2 F (36.8 C)  TempSrc: Oral  SpO2: 96%  Weight: 121 lb 6 oz (55.1 kg)  Height: 5' 2.5" (1.588 m)      Physical Exam Vitals signs and nursing note reviewed.  Constitutional:      General: She is not in acute distress.    Appearance: She is well-developed. She is not ill-appearing, toxic-appearing or diaphoretic.     Comments: Elderly female, well-appearing  HENT:     Head: Normocephalic and atraumatic.     Right Ear: Hearing, tympanic membrane, ear canal and external ear normal.     Left Ear: Hearing, tympanic membrane, ear canal and external ear normal.     Nose: Mucosal edema and rhinorrhea present.     Right Sinus: No maxillary sinus tenderness or frontal sinus tenderness.     Left Sinus: No maxillary sinus tenderness or frontal sinus tenderness.     Mouth/Throat:     Mouth: Mucous membranes are moist. Mucous membranes are not pale.     Pharynx: Oropharynx is clear. Uvula midline. No oropharyngeal exudate or uvula swelling.     Tonsils: No tonsillar abscesses.  Eyes:     General:        Right eye: No discharge.        Left eye:  No discharge.     Conjunctiva/sclera: Conjunctivae normal.     Pupils: Pupils are equal, round, and reactive to light.  Neck:     Musculoskeletal: Normal range of motion and neck supple.     Trachea: No tracheal deviation.  Cardiovascular:  Rate and Rhythm: Normal rate and regular rhythm.     Pulses: Normal pulses.     Heart sounds: Normal heart sounds. No murmur. No friction rub. No gallop.   Pulmonary:     Effort: Pulmonary effort is normal. No respiratory distress.     Breath sounds: No stridor. No wheezing, rhonchi or rales.  Abdominal:     General: Bowel sounds are normal. There is no distension.     Palpations: Abdomen is soft. There is no mass.     Tenderness: There is no abdominal tenderness. There is no right CVA tenderness, left CVA tenderness, guarding or rebound.     Hernia: No hernia is present.  Genitourinary:    Rectum: Normal. Guaiac result negative. No mass, tenderness, anal fissure, external hemorrhoid or internal hemorrhoid. Normal anal tone.  Musculoskeletal: Normal range of motion.  Skin:    General: Skin is warm and dry.     Coloration: Skin is not pale.     Findings: No rash.  Neurological:     Mental Status: She is alert.     Motor: No weakness or abnormal muscle tone.     Coordination: Coordination normal.     Gait: Gait normal.  Psychiatric:        Mood and Affect: Mood normal.        Behavior: Behavior normal.      DRE complete, chaperone present, bedside Hemoccult negative      Assessment & Plan:      ICD-10-CM   1. Abdominal pain, unspecified abdominal location R10.9 Urinalysis, Routine w reflex microscopic    pantoprazole (PROTONIX) 40 MG tablet    famotidine (PEPCID) 20 MG tablet    DG Abd 1 View    Patient with some blood in her pad overnight she suspected it may be from her bottom, she is having some generalized abdominal pain, no tenderness on exam, some reflux symptoms, no fever, change in appetite, no urinary  symptoms.  Hemoccult was negative which was reassuring Urinalysis pending We will obtain KUB -patient wishes to go to [facility] so this was put in No other signs of abd infection.    If KUB unremarkable, 2 week f/up or sooner if not improving.    Delsa Grana, PA-C 07/01/18 2:15 PM   For some reason UA did not result - although I believe pt did leave a sample.

## 2018-07-04 ENCOUNTER — Encounter: Payer: Self-pay | Admitting: Family Medicine

## 2018-07-15 ENCOUNTER — Ambulatory Visit (INDEPENDENT_AMBULATORY_CARE_PROVIDER_SITE_OTHER): Payer: PPO | Admitting: Family Medicine

## 2018-07-15 ENCOUNTER — Encounter: Payer: Self-pay | Admitting: Family Medicine

## 2018-07-15 VITALS — BP 130/64 | HR 80 | Temp 97.9°F | Resp 14 | Ht 62.5 in | Wt 121.0 lb

## 2018-07-15 DIAGNOSIS — E7800 Pure hypercholesterolemia, unspecified: Secondary | ICD-10-CM | POA: Diagnosis not present

## 2018-07-15 DIAGNOSIS — R300 Dysuria: Secondary | ICD-10-CM | POA: Diagnosis not present

## 2018-07-15 DIAGNOSIS — I10 Essential (primary) hypertension: Secondary | ICD-10-CM | POA: Diagnosis not present

## 2018-07-15 DIAGNOSIS — E039 Hypothyroidism, unspecified: Secondary | ICD-10-CM

## 2018-07-15 DIAGNOSIS — R002 Palpitations: Secondary | ICD-10-CM

## 2018-07-15 DIAGNOSIS — Z0000 Encounter for general adult medical examination without abnormal findings: Secondary | ICD-10-CM | POA: Diagnosis not present

## 2018-07-15 DIAGNOSIS — M810 Age-related osteoporosis without current pathological fracture: Secondary | ICD-10-CM | POA: Diagnosis not present

## 2018-07-15 LAB — URINALYSIS, ROUTINE W REFLEX MICROSCOPIC
Bilirubin Urine: NEGATIVE
Glucose, UA: NEGATIVE
HGB URINE DIPSTICK: NEGATIVE
Ketones, ur: NEGATIVE
Leukocytes,Ua: NEGATIVE
Nitrite: NEGATIVE
PROTEIN: NEGATIVE
Specific Gravity, Urine: 1.015 (ref 1.001–1.03)
pH: 7.5 (ref 5.0–8.0)

## 2018-07-15 NOTE — Progress Notes (Signed)
Subjective:    Patient ID: Joanne Gomez, female    DOB: January 18, 1931, 85 y.o.   MRN: [id number]  Medication Refill    Here for CPE.  Last mammogram was 05/2018 and was normal.  Has history of colon cancer and last colonoscopy was 2015.  There were three polyps and biopsy was consistent with three tubular adenomas.  We discussed whether or not the patient should have a repeat colonoscopy.  Due to her age we have elected against a repeat colonoscopy at the present time.  Due to age and her PMH of hysterectomy, she does not require a pap smear.  Dexa in 2018 revealed osteoporosis and prolia was recommended.  Patient's immunizations are listed below and are up-to-date.  On physical exam today, the patient seems to have occasional irregular heartbeats.  They sound like they may be PVCs however I have recommended an EKG to evaluate further.  Otherwise her exam today is normal.  Immunization History  Administered Date(s) Administered  . Influenza, High Dose Seasonal PF 03/01/2017, 03/06/2018  . Influenza,inj,Quad PF,6+ Mos 03/10/2013, 03/08/2014, 03/17/2015, 03/13/2016  . Pneumococcal Conjugate-13 05/11/2013  . Pneumococcal Polysaccharide-23 05/31/2014  . Td 02/25/1996  . Tdap 05/03/2011  . Zoster 02/23/2011    Past Medical History:  Diagnosis Date  . Cancer ([place]) 11/2007   colon stage II  . Diverticula of colon   . GERD (gastroesophageal reflux disease)   . H/O colon cancer, stage II 11/15/2011  . HH (hiatus hernia)   . History of hiatal hernia   . HOH (hard of hearing)    MILD LOSS  . Hyperlipidemia   . Hypothyroidism   . Osteoporosis   . Pneumonia    Past Surgical History:  Procedure Laterality Date  . ABDOMINAL HYSTERECTOMY    . APPENDECTOMY    . CATARACT EXTRACTION W/PHACO Right 08/30/2015   Procedure: CATARACT EXTRACTION PHACO AND INTRAOCULAR LENS PLACEMENT (IOC);  Surgeon: Birder Robson, MD;  Location: [facility] ORS;  Service: Ophthalmology;  Laterality: Right;   [place]     00:40.8 AP%  15.7 CDE    6.42   fluid pack lot #[id number]  exp 02/24/2017  . CATARACT EXTRACTION W/PHACO Left 09/20/2015   Procedure: CATARACT EXTRACTION PHACO AND INTRAOCULAR LENS PLACEMENT (IOC);  Surgeon: Birder Robson, MD;  Location: [facility] ORS;  Service: Ophthalmology;  Laterality: Left;  [place] 44.8 AP% 19.6 CDE 8.77 FLUID PACK LOT # [id number]  . CHOLECYSTECTOMY    . ELBOW SURGERY     right   . hemocolectomy  2009  . VAGINAL PROLAPSE REPAIR     Current Outpatient Medications on File Prior to Visit  Medication Sig Dispense Refill  . amLODipine (NORVASC) 10 MG tablet TAKE ONE TABLET BY MOUTH EVERY DAY ( DISCONTINUE LISINOPRIL HCTZ ) 90 tablet 3  . ARMOUR THYROID 60 MG tablet TAKE ONE TABLET BY MOUTH EVERY MORNING BEFORE BREAKFAST 90 tablet 3  . CALCIUM CITRATE PO Take 1 tablet by mouth 2 (two) times daily. 630mg     . Cholecalciferol (VITAMIN D3) 2000 units TABS Take 1 tablet by mouth daily.    Marland Kitchen denosumab (PROLIA) 60 MG/ML SOLN injection Inject 60 mg into the skin every 6 (six) months. Administer in upper arm, thigh, or abdomen    . esomeprazole (NEXIUM) 40 MG capsule Take 1 capsule (40 mg total) by mouth daily. 90 capsule 3  . famotidine (PEPCID) 20 MG tablet Take 1 tablet (20 mg total) by mouth 2 (two) times daily as needed for heartburn or  indigestion. 60 tablet 0  . fluticasone (FLONASE) 50 MCG/ACT nasal spray Place 2 sprays into both nostrils daily. 16 g 6  . Magnesium 250 MG TABS Take 1 tablet by mouth daily.    . Multiple Vitamin (MULTIVITAMIN) tablet Take 1 tablet by mouth daily.      . pantoprazole (PROTONIX) 40 MG tablet Take 1 tablet (40 mg total) by mouth daily. On empty stomach 30 tablet 3   Current Facility-Administered Medications on File Prior to Visit  Medication Dose Route Frequency Provider Last Rate Last Dose  . denosumab (PROLIA) injection 60 mg  60 mg Subcutaneous Q6 months Susy Frizzle, MD   60 mg at 08/02/17 1617   No Known Allergies Social History    Socioeconomic History  . Marital status: Married    Spouse name: Not on file  . Number of children: Not on file  . Years of education: Not on file  . Highest education level: Not on file  Occupational History  . Not on file  Social Needs  . Financial resource strain: Not on file  . Food insecurity:    Worry: Not on file    Inability: Not on file  . Transportation needs:    Medical: Not on file    Non-medical: Not on file  Tobacco Use  . Smoking status: Never Smoker  . Smokeless tobacco: Never Used  Substance and Sexual Activity  . Alcohol use: Yes    Comment: occ  . Drug use: No  . Sexual activity: Yes    Comment: Married to [place], retired.  Lifestyle  . Physical activity:    Days per week: Not on file    Minutes per session: Not on file  . Stress: Not on file  Relationships  . Social connections:    Talks on phone: Not on file    Gets together: Not on file    Attends religious service: Not on file    Active member of club or organization: Not on file    Attends meetings of clubs or organizations: Not on file    Relationship status: Not on file  . Intimate partner violence:    Fear of current or ex partner: Not on file    Emotionally abused: Not on file    Physically abused: Not on file    Forced sexual activity: Not on file  Other Topics Concern  . Not on file  Social History Narrative  . Not on file   Family History  Problem Relation Age of Onset  . Heart attack Sister 57  . Hyperlipidemia Sister   . Hypertension Sister   . Breast cancer Neg Hx      Review of Systems  All other systems reviewed and are negative.      Objective:   Physical Exam  Constitutional: She is oriented to person, place, and time. She appears well-developed and well-nourished. No distress.  HENT:  Head: Normocephalic and atraumatic.  Right Ear: External ear normal.  Left Ear: External ear normal.  Nose: Nose normal.  Mouth/Throat: Oropharynx is clear and moist. No  oropharyngeal exudate.  Eyes: Pupils are equal, round, and reactive to light. Conjunctivae and EOM are normal. Left eye exhibits no discharge. No scleral icterus.  Neck: Normal range of motion. Neck supple. No JVD present. No tracheal deviation present. No thyromegaly present.  Cardiovascular: Normal rate, regular rhythm, normal heart sounds and intact distal pulses. Exam reveals no gallop and no friction rub.  No murmur heard. Pulmonary/Chest:  Effort normal and breath sounds normal. No stridor. No respiratory distress. She has no wheezes. She has no rales. She exhibits no tenderness.  Abdominal: Soft. Bowel sounds are normal. She exhibits no distension and no mass. There is no abdominal tenderness. There is no rebound and no guarding.  Musculoskeletal: Normal range of motion.        General: No tenderness or edema.  Lymphadenopathy:    She has no cervical adenopathy.  Neurological: She is alert and oriented to person, place, and time. She has normal reflexes. No cranial nerve deficit. She exhibits normal muscle tone. Coordination normal.  Skin: Skin is warm. No rash noted. No erythema. No pallor.  Psychiatric: She has a normal mood and affect. Her behavior is normal. Judgment and thought content normal.  Vitals reviewed.         Assessment & Plan:  Essential hypertension - Plan: CBC with Differential/Platelet, COMPLETE METABOLIC PANEL WITH GFR, Lipid panel, TSH  Pure hypercholesterolemia - Plan: CBC with Differential/Platelet, COMPLETE METABOLIC PANEL WITH GFR, Lipid panel, TSH  Hypothyroidism, unspecified type - Plan: CBC with Differential/Platelet, COMPLETE METABOLIC PANEL WITH GFR, Lipid panel, TSH  Osteoporosis without current pathological fracture, unspecified osteoporosis type  General medical exam - Plan: CBC with Differential/Platelet, COMPLETE METABOLIC PANEL WITH GFR, Lipid panel, TSH  Dysuria - Plan: Urinalysis, Routine w reflex microscopic  Palpitation - Plan: EKG  12-Lead  Because of the palpitations, I will obtain an EKG to evaluate the patient's cardiac rhythm.  Her blood pressure today is well controlled at 130/64.  I will check her cholesterol with a CBC, CMP, and fasting lipid panel.  I will monitor the management of her hypothyroidism by checking a TSH.  Patient is currently on Prolia for osteoporosis.  I would repeat a bone density test next year.  Her mammogram is up-to-date.  She does not require Pap smear.  Together we have elected against repeating colonoscopy.  Patient has some mild dysuria.  Urinalysis today is completely normal and shows no evidence of urinary tract infection.  Patient denies any problems with falls or depression or memory loss.  EKG shows normal sinus rhythm.  It does show occasional PVCs which I believe are the palpitations I was auscultated.  Patient does not physically noticed these.  She does occasionally report heart rates in the 90s at home but at the present time she does not want to try metoprolol to try to help prevent these or control her heart rate.  Therefore we will clinically monitor.  If the patient develops lots of palpitations, I would recommend an event monitor and an empiric trial of Toprol-XL.

## 2018-07-16 ENCOUNTER — Other Ambulatory Visit: Payer: PPO

## 2018-07-16 DIAGNOSIS — E039 Hypothyroidism, unspecified: Secondary | ICD-10-CM | POA: Diagnosis not present

## 2018-07-16 DIAGNOSIS — I10 Essential (primary) hypertension: Secondary | ICD-10-CM | POA: Diagnosis not present

## 2018-07-16 DIAGNOSIS — E7800 Pure hypercholesterolemia, unspecified: Secondary | ICD-10-CM | POA: Diagnosis not present

## 2018-07-16 DIAGNOSIS — Z0000 Encounter for general adult medical examination without abnormal findings: Secondary | ICD-10-CM | POA: Diagnosis not present

## 2018-07-17 LAB — CBC WITH DIFFERENTIAL/PLATELET
Absolute Monocytes: 442 cells/uL (ref 200–950)
BASOS PCT: 0.7 %
Basophils Absolute: 47 cells/uL (ref 0–200)
EOS ABS: 87 {cells}/uL (ref 15–500)
Eosinophils Relative: 1.3 %
HCT: 39.2 % (ref 35.0–45.0)
Hemoglobin: 13.1 g/dL (ref 11.7–15.5)
Lymphs Abs: 1950 cells/uL (ref 850–3900)
MCH: 29 pg (ref 27.0–33.0)
MCHC: 33.4 g/dL (ref 32.0–36.0)
MCV: 86.7 fL (ref 80.0–100.0)
MPV: 11.3 fL (ref 7.5–12.5)
Monocytes Relative: 6.6 %
Neutro Abs: 4174 cells/uL (ref 1500–7800)
Neutrophils Relative %: 62.3 %
Platelets: 250 10*3/uL (ref 140–400)
RBC: 4.52 10*6/uL (ref 3.80–5.10)
RDW: 12.8 % (ref 11.0–15.0)
Total Lymphocyte: 29.1 %
WBC: 6.7 10*3/uL (ref 3.8–10.8)

## 2018-07-17 LAB — COMPLETE METABOLIC PANEL WITH GFR
AG Ratio: 1.3 (calc) (ref 1.0–2.5)
ALT: 11 U/L (ref 6–29)
AST: 18 U/L (ref 10–35)
Albumin: 4.2 g/dL (ref 3.6–5.1)
Alkaline phosphatase (APISO): 57 U/L (ref 37–153)
BUN: 12 mg/dL (ref 7–25)
CHLORIDE: 105 mmol/L (ref 98–110)
CO2: 29 mmol/L (ref 20–32)
Calcium: 9.5 mg/dL (ref 8.6–10.4)
Creat: 0.71 mg/dL (ref 0.60–0.88)
GFR, Est African American: 90 mL/min/{1.73_m2} (ref >=60)
GFR, Est Non African American: 78 mL/min/{1.73_m2} (ref >=60)
Globulin: 3.2 g/dL (calc) (ref 1.9–3.7)
Glucose, Bld: 85 mg/dL (ref 65–99)
Potassium: 4.5 mmol/L (ref 3.5–5.3)
Sodium: 143 mmol/L (ref 135–146)
Total Bilirubin: 0.5 mg/dL (ref 0.2–1.2)
Total Protein: 7.4 g/dL (ref 6.1–8.1)

## 2018-07-17 LAB — TSH: TSH: 1.57 m[IU]/L (ref 0.40–4.50)

## 2018-07-17 LAB — LIPID PANEL
Cholesterol: 213 mg/dL — ABNORMAL HIGH (ref <200)
HDL: 68 mg/dL (ref >=50)
LDL CHOLESTEROL (CALC): 127 mg/dL — AB
Non-HDL Cholesterol (Calc): 145 mg/dL (calc) — ABNORMAL HIGH (ref <130)
Total CHOL/HDL Ratio: 3.1 (calc) (ref <5.0)
Triglycerides: 79 mg/dL (ref <150)

## 2018-08-08 ENCOUNTER — Ambulatory Visit: Payer: PPO

## 2018-08-13 ENCOUNTER — Ambulatory Visit (INDEPENDENT_AMBULATORY_CARE_PROVIDER_SITE_OTHER): Payer: PPO

## 2018-08-13 ENCOUNTER — Other Ambulatory Visit: Payer: Self-pay

## 2018-08-13 DIAGNOSIS — M810 Age-related osteoporosis without current pathological fracture: Secondary | ICD-10-CM | POA: Diagnosis not present

## 2018-08-13 NOTE — Progress Notes (Signed)
Patient came in for Prolia injection. Injection was given in the left arm. Patient tolerated well.

## 2018-11-17 ENCOUNTER — Telehealth: Payer: Self-pay | Admitting: *Deleted

## 2018-11-17 ENCOUNTER — Other Ambulatory Visit: Payer: Self-pay

## 2018-11-17 ENCOUNTER — Ambulatory Visit (INDEPENDENT_AMBULATORY_CARE_PROVIDER_SITE_OTHER): Payer: PPO | Admitting: Family Medicine

## 2018-11-17 DIAGNOSIS — K5229 Other allergic and dietetic gastroenteritis and colitis: Secondary | ICD-10-CM | POA: Diagnosis not present

## 2018-11-17 NOTE — Progress Notes (Signed)
Subjective:    Patient ID: Joanne Gomez, female    DOB: 08-03-1930, 85 y.o.   MRN: [id number]  HPI  Patient is being seen today for a food allergy.  She is currently at home.  I am currently my office.  Phone call began at [phone].  Phone call concluded at 443.  She consents to be seen by telephone. Patient requests lab work to be evaluated for food allergy.  Symptoms have been going on now for for 5 years.  She initially brought them to my attention in 2016.  She complains of a pain that starts in her neck and radiates from her neck into her upper shoulder levels.  It can be on the left side it can be on the right side.  It will actually cause muscle spasms that improve with massage.  I explained to the patient that I did not feel that this was related to any gastrointestinal issue but was rather likely due to arthritis.  I obtained an x-ray of the cervical spine in 2016 which confirmed degenerative disc disease and arthritic changes throughout the cervical spine.  However the patient is convinced that this is some type of reaction to food.  She states that when she wakes up in the morning the pain is not present.  1 hour after eating lunch the pain will develop.  Usually 1 hour after eating supper the pain will intensify.  She has not kept a particular food journal to see if there is any certain food that triggers the pain.  However by the end of the day with massage and anti-inflammatories the pain will improve.  I played doubles advocate and asked the patient what made her think that this was an allergy versus just the pain that she experiences in her neck on a daily basis due to arthritis and she is convinced that it only occurs 1 hour after eating.  She denies any nausea or vomiting.  She denies any swelling of her tongue or lips.  She denies any rash.  She denies any diarrhea or bloody diarrhea or abdominal pain.  I again explained to the patient that the symptoms are inconsistent with a food allergy.   I believe it is a coincidence that the pain occurs 1 hour after eating however the patient states that for the last 5 years the pain occurs like clockwork 1 to 2 hours after she eats.  She denies any dysphasia.  She denies any odynophagia.  However recently the pain has started to intensify. Past Medical History:  Diagnosis Date   Cancer ([place]) 11/2007   colon stage II   Diverticula of colon    GERD (gastroesophageal reflux disease)    H/O colon cancer, stage II 11/15/2011   HH (hiatus hernia)    History of hiatal hernia    HOH (hard of hearing)    MILD LOSS   Hyperlipidemia    Hypothyroidism    Osteoporosis    Pneumonia    Past Surgical History:  Procedure Laterality Date   ABDOMINAL HYSTERECTOMY     APPENDECTOMY     CATARACT EXTRACTION W/PHACO Right 08/30/2015   Procedure: CATARACT EXTRACTION PHACO AND INTRAOCULAR LENS PLACEMENT ([place]);  Surgeon: Birder Robson, MD;  Location: [facility] OR[facility];  Service: Ophthalmology;  Laterality: Right;   [place]    00:40.8 AP%  15.7 CDE    6.42   fluid pack lot #[id number]  exp 02/24/2017   CATARACT EXTRACTION W/PHACO Left 09/20/2015   Procedure: CATARACT  EXTRACTION PHACO AND INTRAOCULAR LENS PLACEMENT (IOC);  Surgeon: Birder Robson, MD;  Location: [facility] OR[facility];  Service: Ophthalmology;  Laterality: Left;  [place] 44.8 AP% 19.6 CDE 8.77 FLUID PACK LOT # [id number]   CHOLECYSTECTOMY     ELBOW SURGERY     right    hemocolectomy  2009   VAGINAL PROLAPSE REPAIR     Current Outpatient Medications on File Prior to Visit  Medication Sig Dispense Refill   amLODipine (NORVASC) 10 MG tablet TAKE ONE TABLET BY MOUTH EVERY DAY ( DISCONTINUE LISINOPRIL HCTZ ) 90 tablet 3   ARMOUR THYROID 60 MG tablet TAKE ONE TABLET BY MOUTH EVERY MORNING BEFORE BREAKFAST 90 tablet 3   CALCIUM CITRATE PO Take 1 tablet by mouth 2 (two) times daily. 630mg      Cholecalciferol (VITAMIN D3) 2000 units TABS Take 1 tablet by mouth daily.     denosumab (PROLIA) 60 MG/ML SOLN  injection Inject 60 mg into the skin every 6 (six) months. Administer in upper arm, thigh, or abdomen     famotidine (PEPCID) 20 MG tablet Take 1 tablet (20 mg total) by mouth 2 (two) times daily as needed for heartburn or indigestion. 60 tablet 0   fluticasone (FLONASE) 50 MCG/ACT nasal spray Place 2 sprays into both nostrils daily. 16 g 6   Magnesium 250 MG TABS Take 1 tablet by mouth daily.     Multiple Vitamin (MULTIVITAMIN) tablet Take 1 tablet by mouth daily.       pantoprazole (PROTONIX) 40 MG tablet Take 1 tablet (40 mg total) by mouth daily. On empty stomach 30 tablet 3   vitamin B-12 (CYANOCOBALAMIN) 1000 MCG tablet Take 5,000 mcg by mouth daily.     XIIDRA 5 % SOLN Place 1 drop into both eyes daily.      Current Facility-Administered Medications on File Prior to Visit  Medication Dose Route Frequency Provider Last Rate Last Dose   denosumab (PROLIA) injection 60 mg  60 mg Subcutaneous Q6 months Susy Frizzle, MD   60 mg at 08/13/18 1421   No Known Allergies Social History   Socioeconomic History   Marital status: Married    Spouse name: Not on file   Number of children: Not on file   Years of education: Not on file   Highest education level: Not on file  Occupational History   Not on file  Social Needs   Financial resource strain: Not on file   Food insecurity    Worry: Not on file    Inability: Not on file   Transportation needs    Medical: Not on file    Non-medical: Not on file  Tobacco Use   Smoking status: Never Smoker   Smokeless tobacco: Never Used  Substance and Sexual Activity   Alcohol use: Yes    Comment: occ   Drug use: No   Sexual activity: Yes    Comment: Married to [place], retired.  Lifestyle   Physical activity    Days per week: Not on file    Minutes per session: Not on file   Stress: Not on file  Relationships   Social connections    Talks on phone: Not on file    Gets together: Not on file    Attends religious  service: Not on file    Active member of club or organization: Not on file    Attends meetings of clubs or organizations: Not on file    Relationship status: Not on file  Intimate partner violence    Fear of current or ex partner: Not on file    Emotionally abused: Not on file    Physically abused: Not on file    Forced sexual activity: Not on file  Other Topics Concern   Not on file  Social History Narrative   Not on file      Review of Systems  All other systems reviewed and are negative.      Objective:   Physical Exam  No physical exam was performed today as the patient was seen as a telephone visit      Assessment & Plan:  1. Gastrointestinal food allergy I explained to the patient that the symptoms do not sound to be a food allergy.  I believe it is coincidental that the pain seems to occur after she eats.  Instead I believe the pain in her neck is likely arthritic.  However the patient is convinced that this is a food allergy and politely asked if I would check lab work to evaluate for any food allergies that she may have.  Therefore I will obtain the requested lab panel.  If lab work is negative for any allergy, I would recommend repeat imaging of the neck and consultation with orthopedics.  If she has pain with swallowing or dysphagia or food sticking proceed with egd. - Allergy Panel 15, Cereal Group - Allergy Panel 16, Vegetable Group - Allergy Panel 18, Nut Mix Group - Allergy Panel, Animal Group

## 2018-11-17 NOTE — Telephone Encounter (Signed)
-----   Message from Susy Frizzle, MD sent at 11/17/2018  7:04 AM EDT ----- Depends on what allergies she is referring to.  Set up telephone visit.  ----- Message ----- From: Alyson Locket, RMA Sent: 11/14/2018   4:53 PM EDT To: Susy Frizzle, MD  Pt called and wanted to know if she can come in and have blood work done for allergies?????

## 2018-11-17 NOTE — Telephone Encounter (Signed)
Appointment scheduled for 11/17/2018.

## 2018-11-21 ENCOUNTER — Other Ambulatory Visit: Payer: Self-pay

## 2018-11-21 ENCOUNTER — Ambulatory Visit (INDEPENDENT_AMBULATORY_CARE_PROVIDER_SITE_OTHER): Payer: PPO | Admitting: Family Medicine

## 2018-11-21 ENCOUNTER — Encounter: Payer: Self-pay | Admitting: Family Medicine

## 2018-11-21 VITALS — BP 150/70 | HR 76 | Temp 98.6°F | Resp 14 | Ht 62.5 in | Wt 115.0 lb

## 2018-11-21 DIAGNOSIS — K5229 Other allergic and dietetic gastroenteritis and colitis: Secondary | ICD-10-CM

## 2018-11-21 DIAGNOSIS — B351 Tinea unguium: Secondary | ICD-10-CM | POA: Diagnosis not present

## 2018-11-21 NOTE — Progress Notes (Signed)
Subjective:    Patient ID: Joanne Gomez, female    DOB: [birth year], 85 y.o.   MRN: [id number]  HPI Patient presents today with a thick yellow dystrophic left toenail.  The great toe on the left foot has a thick toenail that is partially avulsed on the distal portion.  I am able to lift the distal portion of the toenail approximately 2 mm without causing any pain.  It is also partially separating under the medial proximal portion.  There is no erythema or infection around the toe however the toenail is thick and yellow suggesting underlying onychomycosis.  The toenails adjacent to this also are thick and yellow. Past Medical History:  Diagnosis Date  . Cancer ([place]) 11/2007   colon stage II  . Diverticula of colon   . GERD (gastroesophageal reflux disease)   . H/O colon cancer, stage II 11/15/2011  . HH (hiatus hernia)   . History of hiatal hernia   . HOH (hard of hearing)    MILD LOSS  . Hyperlipidemia   . Hypothyroidism   . Osteoporosis   . Pneumonia    Past Surgical History:  Procedure Laterality Date  . ABDOMINAL HYSTERECTOMY    . APPENDECTOMY    . CATARACT EXTRACTION W/PHACO Right 08/30/2015   Procedure: CATARACT EXTRACTION PHACO AND INTRAOCULAR LENS PLACEMENT (IOC);  Surgeon: Birder Robson, MD;  Location: [facility] ORS;  Service: Ophthalmology;  Laterality: Right;   [place]    00:40.8 AP%  15.7 CDE    6.42   fluid pack lot #[id number] H  exp 02/24/2017  . CATARACT EXTRACTION W/PHACO Left 09/20/2015   Procedure: CATARACT EXTRACTION PHACO AND INTRAOCULAR LENS PLACEMENT (IOC);  Surgeon: Birder Robson, MD;  Location: [facility];  Service: Ophthalmology;  Laterality: Left;  [place] 44.8 AP% 19.6 CDE 8.77 FLUID PACK LOT # [id number]  . CHOLECYSTECTOMY    . ELBOW SURGERY     right   . hemocolectomy  2009  . VAGINAL PROLAPSE REPAIR     Current Outpatient Medications on File Prior to Visit  Medication Sig Dispense Refill  . amLODipine (NORVASC) 10 MG tablet TAKE ONE TABLET BY MOUTH EVERY DAY (  DISCONTINUE LISINOPRIL HCTZ ) 90 tablet 3  . ARMOUR THYROID 60 MG tablet TAKE ONE TABLET BY MOUTH EVERY MORNING BEFORE BREAKFAST 90 tablet 3  . CALCIUM CITRATE PO Take 1 tablet by mouth 2 (two) times daily. 630mg     . Cholecalciferol (VITAMIN D3) 2000 units TABS Take 1 tablet by mouth daily.    Marland Kitchen denosumab (PROLIA) 60 MG/ML SOLN injection Inject 60 mg into the skin every 6 (six) months. Administer in upper arm, thigh, or abdomen    . famotidine (PEPCID) 20 MG tablet Take 1 tablet (20 mg total) by mouth 2 (two) times daily as needed for heartburn or indigestion. 60 tablet 0  . fluticasone (FLONASE) 50 MCG/ACT nasal spray Place 2 sprays into both nostrils daily. 16 g 6  . Magnesium 250 MG TABS Take 1 tablet by mouth daily.    . Multiple Vitamin (MULTIVITAMIN) tablet Take 1 tablet by mouth daily.      . pantoprazole (PROTONIX) 40 MG tablet Take 1 tablet (40 mg total) by mouth daily. On empty stomach 30 tablet 3  . vitamin B-12 (CYANOCOBALAMIN) 1000 MCG tablet Take 5,000 mcg by mouth daily.     Current Facility-Administered Medications on File Prior to Visit  Medication Dose Route Frequency Provider Last Rate Last Dose  . denosumab (PROLIA) injection 60 mg  60 mg Subcutaneous Q6 months Susy Frizzle, MD   60 mg at 08/13/18 1421   No Known Allergies Social History   Socioeconomic History  . Marital status: Married    Spouse name: Not on file  . Number of children: Not on file  . Years of education: Not on file  . Highest education level: Not on file  Occupational History  . Not on file  Social Needs  . Financial resource strain: Not on file  . Food insecurity    Worry: Not on file    Inability: Not on file  . Transportation needs    Medical: Not on file    Non-medical: Not on file  Tobacco Use  . Smoking status: Never Smoker  . Smokeless tobacco: Never Used  Substance and Sexual Activity  . Alcohol use: Yes    Comment: occ  . Drug use: No  . Sexual activity: Yes    Comment:  Married to [place], retired.  Lifestyle  . Physical activity    Days per week: Not on file    Minutes per session: Not on file  . Stress: Not on file  Relationships  . Social Herbalist on phone: Not on file    Gets together: Not on file    Attends religious service: Not on file    Active member of club or organization: Not on file    Attends meetings of clubs or organizations: Not on file    Relationship status: Not on file  . Intimate partner violence    Fear of current or ex partner: Not on file    Emotionally abused: Not on file    Physically abused: Not on file    Forced sexual activity: Not on file  Other Topics Concern  . Not on file  Social History Narrative  . Not on file      Review of Systems  All other systems reviewed and are negative.      Objective:   Physical Exam Vitals signs reviewed.  Cardiovascular:     Rate and Rhythm: Normal rate and regular rhythm.     Heart sounds: Normal heart sounds.  Pulmonary:     Effort: Pulmonary effort is normal.     Breath sounds: Normal breath sounds.     Please see the description of the left foot and the great toenail in the history of present illness      Assessment & Plan:  The primary encounter diagnosis was Gastrointestinal food allergy. A diagnosis of Onychomycosis was also pertinent to this visit. Patient has onychomycosis of the toenails of the left foot and to a lesser extent the toenails on the right foot.  This is led to thick dystrophic toenails and the left great toenail is also partially avulsed.  Is not causing the patient any pain however I do not believe that this toenail is viable and will likely require being removed or it will fall off on its own over time.  We discussed the patient's options and she declines to have the toenail removed today.  Instead she will monitor it and allow it to gradually fall off over time.  If it starts becoming infected or painful she can return and I can remove  the toenail for her.  We also discussed Lamisil for onychomycosis but the patient elected not to pursue this treatment

## 2018-11-24 LAB — ALLERGY PANEL 18, NUT MIX GROUP
Almonds: <0.1 kU/L
CLASS: 0
CLASS: 0
CLASS: 0
CLASS: 0
CLASS: 0
CLASS: 0
Cashew IgE: <0.1 kU/L
Class: 0
Coconut: <0.1 kU/L
Hazelnut: <0.1 kU/L
Peanut IgE: <0.1 kU/L
Pecan Nut: <0.1 kU/L
Sesame Seed f10: <0.1 kU/L

## 2018-11-24 LAB — ALLERGY PANEL 16, VEGETABLE GROUP
Allergen Pea f12: <0.1 kU/L
Allergen, White Bean, f15: <0.1 kU/L
Allergen, White Potato,f35: <0.1 kU/L
CLASS: 0
CLASS: 0
Carrot: <0.1 kU/L
Class: 0
Class: 0
Corn: <0.1 kU/L
NR Class f15: 0

## 2018-11-24 LAB — ALLERGY PANEL 15, CEREAL GROUP
Allergen Barley f6: <0.1 kU/L
Allergen, Rice, f9: <0.1 kU/L
Allergen, Rye, f5: <0.1 kU/L
Buckwheat (f11) IgE: <0.1 kU/L
CLASS: 0
CLASS: 0
CLASS: 0
CLASS: 0
Class F11: 0
Gluten f79: <0.1 kU/L

## 2018-11-24 LAB — ALLERGY PANEL, ANIMAL GROUP
Allergen, Chicken feather, e91: <0.1 kU/L
Allergen, Mouse Urine Protein, e78: <0.1 kU/L
Allergen,Goose feathers, e70: <0.1 kU/L
CLASS: 0
CLASS: 0
CLASS: 0
CLASS: 0
Class: 0
Cow Dander IgE: <0.1 kU/L
Horse dander: <0.1 kU/L

## 2018-11-24 LAB — INTERPRETATION:

## 2018-11-25 DIAGNOSIS — D2261 Melanocytic nevi of right upper limb, including shoulder: Secondary | ICD-10-CM | POA: Diagnosis not present

## 2018-11-25 DIAGNOSIS — D2262 Melanocytic nevi of left upper limb, including shoulder: Secondary | ICD-10-CM | POA: Diagnosis not present

## 2018-11-25 DIAGNOSIS — L820 Inflamed seborrheic keratosis: Secondary | ICD-10-CM | POA: Diagnosis not present

## 2018-11-25 DIAGNOSIS — L298 Other pruritus: Secondary | ICD-10-CM | POA: Diagnosis not present

## 2018-11-25 DIAGNOSIS — Z85828 Personal history of other malignant neoplasm of skin: Secondary | ICD-10-CM | POA: Diagnosis not present

## 2018-11-25 DIAGNOSIS — L538 Other specified erythematous conditions: Secondary | ICD-10-CM | POA: Diagnosis not present

## 2018-11-25 DIAGNOSIS — L661 Lichen planopilaris: Secondary | ICD-10-CM | POA: Diagnosis not present

## 2018-11-25 DIAGNOSIS — X32XXXA Exposure to sunlight, initial encounter: Secondary | ICD-10-CM | POA: Diagnosis not present

## 2018-11-25 DIAGNOSIS — D225 Melanocytic nevi of trunk: Secondary | ICD-10-CM | POA: Diagnosis not present

## 2018-11-25 DIAGNOSIS — L570 Actinic keratosis: Secondary | ICD-10-CM | POA: Diagnosis not present

## 2018-12-22 ENCOUNTER — Other Ambulatory Visit: Payer: Self-pay

## 2018-12-23 ENCOUNTER — Ambulatory Visit (INDEPENDENT_AMBULATORY_CARE_PROVIDER_SITE_OTHER): Payer: PPO | Admitting: Family Medicine

## 2018-12-23 ENCOUNTER — Ambulatory Visit
Admission: RE | Admit: 2018-12-23 | Discharge: 2018-12-23 | Disposition: A | Discharge status: Home / Self Care | Payer: PPO | Source: Ambulatory Visit | Attending: Family Medicine | Admitting: Family Medicine

## 2018-12-23 ENCOUNTER — Encounter: Payer: Self-pay | Admitting: Family Medicine

## 2018-12-23 VITALS — BP 130/80 | HR 86 | Temp 98.5°F | Resp 14 | Ht 62.5 in | Wt 111.0 lb

## 2018-12-23 DIAGNOSIS — R079 Chest pain, unspecified: Secondary | ICD-10-CM

## 2018-12-23 DIAGNOSIS — R1013 Epigastric pain: Secondary | ICD-10-CM

## 2018-12-23 DIAGNOSIS — R634 Abnormal weight loss: Secondary | ICD-10-CM | POA: Diagnosis not present

## 2018-12-23 NOTE — Progress Notes (Signed)
Subjective:    Patient ID: Joanne Gomez, female    DOB: 23-Jun-1930, 85 y.o.   MRN: [id number]  HPI Patient is concerned.  Over the last year she has lost 15 to 16 pounds.  She states that the problem is getting worse specifically she complains of pain after she eats.  She originally brought this to my attention 5 years ago.  The patient would develop pain in her neck around the level of C7 and in her posterior right shoulder within 1 to 3 hours after eating a meal.  The pain was sharp and intense and sometimes triggered muscle spasms that her husband could massage out.  I did not feel that this was a gastrointestinal issue and originally obtain x-rays of the cervical spine which did confirm degenerative disc disease in the cervical spine.  However patient states that the problem is getting worse.  Now whenever she eats she develops a discomfort in her chest.  The pain is located under her sternum.  She has started limiting her food to try to prevent the pain from happening.  The pain can also occur in her right flank, in her shoulders, and her lower back.  As I question her further, the pain has no definable pattern.  It seems to be sharp pains and joint pains and muscular pains that seem to come at random times.  There is no discernible pattern to when she eats.  I question the patient as to whether this may be unrelated to food and that this may be muscular pains however she is convinced that the food plays a role in it.  She has a difficult time explaining all the symptoms that she is feeling but she is convinced that when she eats the food seems to exacerbate the situation.  She denies any melena or hematochezia.  She denies any hematemesis.  She denies any nausea or vomiting or diarrhea or constipation. Past Medical History:  Diagnosis Date  . Cancer ([place]) 11/2007   colon stage II  . Diverticula of colon   . GERD (gastroesophageal reflux disease)   . H/O colon cancer, stage II 11/15/2011  . HH  (hiatus hernia)   . History of hiatal hernia   . HOH (hard of hearing)    MILD LOSS  . Hyperlipidemia   . Hypothyroidism   . Osteoporosis   . Pneumonia    Past Surgical History:  Procedure Laterality Date  . ABDOMINAL HYSTERECTOMY    . APPENDECTOMY    . CATARACT EXTRACTION W/PHACO Right 08/30/2015   Procedure: CATARACT EXTRACTION PHACO AND INTRAOCULAR LENS PLACEMENT (IOC);  Surgeon: Birder Robson, MD;  Location: [facility] ORS;  Service: Ophthalmology;  Laterality: Right;   [place]    00:40.8 AP%  15.7 CDE    6.42   fluid pack lot #[id number]  exp 02/24/2017  . CATARACT EXTRACTION W/PHACO Left 09/20/2015   Procedure: CATARACT EXTRACTION PHACO AND INTRAOCULAR LENS PLACEMENT (IOC);  Surgeon: Birder Robson, MD;  Location: [facility] OR[facility];  Service: Ophthalmology;  Laterality: Left;  [place] 44.8 AP% 19.6 CDE 8.77 FLUID PACK LOT # [id number]  . CHOLECYSTECTOMY    . ELBOW SURGERY     right   . hemocolectomy  2009  . VAGINAL PROLAPSE REPAIR     Current Outpatient Medications on File Prior to Visit  Medication Sig Dispense Refill  . amLODipine (NORVASC) 10 MG tablet TAKE ONE TABLET BY MOUTH EVERY DAY ( DISCONTINUE LISINOPRIL HCTZ ) 90 tablet 3  . ARMOUR THYROID  60 MG tablet TAKE ONE TABLET BY MOUTH EVERY MORNING BEFORE BREAKFAST 90 tablet 3  . CALCIUM CITRATE PO Take 1 tablet by mouth 2 (two) times daily. 630mg     . Cholecalciferol (VITAMIN D3) 2000 units TABS Take 1 tablet by mouth daily.    Marland Kitchen denosumab (PROLIA) 60 MG/ML SOLN injection Inject 60 mg into the skin every 6 (six) months. Administer in upper arm, thigh, or abdomen    . famotidine (PEPCID) 20 MG tablet Take 1 tablet (20 mg total) by mouth 2 (two) times daily as needed for heartburn or indigestion. 60 tablet 0  . fluticasone (FLONASE) 50 MCG/ACT nasal spray Place 2 sprays into both nostrils daily. 16 g 6  . Magnesium 250 MG TABS Take 1 tablet by mouth daily.    . Multiple Vitamin (MULTIVITAMIN) tablet Take 1 tablet by mouth daily.      .  pantoprazole (PROTONIX) 40 MG tablet Take 1 tablet (40 mg total) by mouth daily. On empty stomach 30 tablet 3  . vitamin B-12 (CYANOCOBALAMIN) 1000 MCG tablet Take 5,000 mcg by mouth daily.     Current Facility-Administered Medications on File Prior to Visit  Medication Dose Route Frequency Provider Last Rate Last Dose  . denosumab (PROLIA) injection 60 mg  60 mg Subcutaneous Q6 months Susy Frizzle, MD   60 mg at 08/13/18 1421   No Known Allergies Social History   Socioeconomic History  . Marital status: Married    Spouse name: Not on file  . Number of children: Not on file  . Years of education: Not on file  . Highest education level: Not on file  Occupational History  . Not on file  Social Needs  . Financial resource strain: Not on file  . Food insecurity    Worry: Not on file    Inability: Not on file  . Transportation needs    Medical: Not on file    Non-medical: Not on file  Tobacco Use  . Smoking status: Never Smoker  . Smokeless tobacco: Never Used  Substance and Sexual Activity  . Alcohol use: Yes    Comment: occ  . Drug use: No  . Sexual activity: Yes    Comment: Married to [place], retired.  Lifestyle  . Physical activity    Days per week: Not on file    Minutes per session: Not on file  . Stress: Not on file  Relationships  . Social Herbalist on phone: Not on file    Gets together: Not on file    Attends religious service: Not on file    Active member of club or organization: Not on file    Attends meetings of clubs or organizations: Not on file    Relationship status: Not on file  . Intimate partner violence    Fear of current or ex partner: Not on file    Emotionally abused: Not on file    Physically abused: Not on file    Forced sexual activity: Not on file  Other Topics Concern  . Not on file  Social History Narrative  . Not on file      Review of Systems  All other systems reviewed and are negative.      Objective:    Physical Exam Vitals signs reviewed.  Constitutional:      General: She is not in acute distress.    Appearance: Normal appearance. She is normal weight. She is not ill-appearing or toxic-appearing.  Cardiovascular:     Rate and Rhythm: Normal rate and regular rhythm.     Heart sounds: Normal heart sounds.  Pulmonary:     Effort: Pulmonary effort is normal. No respiratory distress.     Breath sounds: Normal breath sounds. No stridor. No wheezing or rhonchi.  Abdominal:     General: Abdomen is flat. Bowel sounds are normal. There is no distension.     Palpations: Abdomen is soft. There is no mass.     Tenderness: There is no abdominal tenderness. There is no guarding or rebound.     Hernia: No hernia is present.  Neurological:     Mental Status: She is alert.           Assessment & Plan:  The primary encounter diagnosis was Chest pain in adult. Diagnoses of Weight loss and Dyspepsia were also pertinent to this visit. I spent more than 30 minutes today with the patient.  I do not believe that her pains are gastrointestinal in nature.  I question the patient as to whether it is possible she could be having pain in her neck due to arthritis as well as pain in other areas located to musculoskeletal conditions that may be unrelated to food.  However the patient is convinced that the food seems to trigger the pain in some way.  Now she is losing weight.  Therefore I have recommended an EGD through GI.  She does have a hiatal hernia and it is possible acid could be causing some pain in between her shoulder blades and in her mid back.  Given her history of colon cancer, I would like GIs opinion on this.  I will also obtain a chest x-ray given the substernal chest pain and pressure that occurs for no reason.  If the above work-up is negative, I would recommend focusing her attention on treating the pain as a musculoskeletal source of pain which is my medical opinion.  I would next proceed with an  MRI of the cervical spine and consider epidural steroid injections if in fact significant areas of arthritis are confirmed.

## 2018-12-30 ENCOUNTER — Encounter: Payer: Self-pay | Admitting: Gastroenterology

## 2019-01-16 ENCOUNTER — Telehealth: Payer: Self-pay | Admitting: Family Medicine

## 2019-01-16 NOTE — Telephone Encounter (Signed)
Pt called and states that she would like to know if you think it is absolutely necessary for her to go see a GI doctor? She states that if they did find something would they even treat it at her age??

## 2019-01-16 NOTE — Telephone Encounter (Signed)
She wanted to see GI?  I would leave it to her discretion.  Please see my ov for further details.

## 2019-01-19 NOTE — Telephone Encounter (Signed)
Patient aware of providers recommendations and will cxd apt.

## 2019-02-09 ENCOUNTER — Ambulatory Visit: Payer: PPO | Admitting: Gastroenterology

## 2019-02-10 ENCOUNTER — Other Ambulatory Visit: Payer: Self-pay | Admitting: Family Medicine

## 2019-02-16 ENCOUNTER — Ambulatory Visit: Payer: PPO

## 2019-02-16 ENCOUNTER — Other Ambulatory Visit: Payer: Self-pay

## 2019-02-17 ENCOUNTER — Ambulatory Visit (INDEPENDENT_AMBULATORY_CARE_PROVIDER_SITE_OTHER): Payer: PPO | Admitting: Family Medicine

## 2019-02-17 ENCOUNTER — Encounter: Payer: Self-pay | Admitting: Family Medicine

## 2019-02-17 VITALS — BP 142/80 | HR 88 | Temp 97.6°F | Resp 16 | Ht 62.5 in | Wt 109.0 lb

## 2019-02-17 DIAGNOSIS — E039 Hypothyroidism, unspecified: Secondary | ICD-10-CM | POA: Diagnosis not present

## 2019-02-17 DIAGNOSIS — Z23 Encounter for immunization: Secondary | ICD-10-CM

## 2019-02-17 DIAGNOSIS — I10 Essential (primary) hypertension: Secondary | ICD-10-CM | POA: Diagnosis not present

## 2019-02-17 DIAGNOSIS — R1013 Epigastric pain: Secondary | ICD-10-CM

## 2019-02-17 DIAGNOSIS — E7800 Pure hypercholesterolemia, unspecified: Secondary | ICD-10-CM

## 2019-02-17 MED ORDER — AMITRIPTYLINE HCL 25 MG PO TABS: 25.0000 mg | ORAL_TABLET | Freq: Every day | ORAL | 0 refills | Status: DC

## 2019-02-17 MED ORDER — ESOMEPRAZOLE MAGNESIUM 40 MG PO CPDR: 40.0000 mg | DELAYED_RELEASE_CAPSULE | Freq: Every day | ORAL | 3 refills | Status: DC

## 2019-02-17 MED ORDER — FLUOCINOLONE ACETONIDE 0.01 % OT OIL: 3.0000 [drp] | TOPICAL_OIL | Freq: Two times a day (BID) | OTIC | 0 refills | Status: AC | PRN

## 2019-02-17 NOTE — Addendum Note (Signed)
Addended by: Shary Decamp B on: 02/17/2019 02:21 PM   Modules accepted: Orders

## 2019-02-17 NOTE — Progress Notes (Signed)
Subjective:    Patient ID: Joanne Gomez, female    DOB: May 12, 1931, 85 y.o.   MRN: [id number]  HPI  12/23/18 Patient is concerned.  Over the last year she has lost 15 to 16 pounds.  She states that the problem is getting worse specifically she complains of pain after she eats.  She originally brought this to my attention 5 years ago.  The patient would develop pain in her neck around the level of C7 and in her posterior right shoulder within 1 to 3 hours after eating a meal.  The pain was sharp and intense and sometimes triggered muscle spasms that her husband could massage out.  I did not feel that this was a gastrointestinal issue and originally obtain x-rays of the cervical spine which did confirm degenerative disc disease in the cervical spine.  However patient states that the problem is getting worse.  Now whenever she eats she develops a discomfort in her chest.  The pain is located under her sternum.  She has started limiting her food to try to prevent the pain from happening.  The pain can also occur in her right flank, in her shoulders, and her lower back.  As I question her further, the pain has no definable pattern.  It seems to be sharp pains and joint pains and muscular pains that seem to come at random times.  There is no discernible pattern to when she eats.  I question the patient as to whether this may be unrelated to food and that this may be muscular pains however she is convinced that the food plays a role in it.  She has a difficult time explaining all the symptoms that she is feeling but she is convinced that when she eats the food seems to exacerbate the situation.  She denies any melena or hematochezia.  She denies any hematemesis.  She denies any nausea or vomiting or diarrhea or constipation.  At that time, my plan was: I spent more than 30 minutes today with the patient.  I do not believe that her pains are gastrointestinal in nature.  I question the patient as to whether it is  possible she could be having pain in her neck due to arthritis as well as pain in other areas located to musculoskeletal conditions that may be unrelated to food.  However the patient is convinced that the food seems to trigger the pain in some way.  Now she is losing weight.  Therefore I have recommended an EGD through GI.  She does have a hiatal hernia and it is possible acid could be causing some pain in between her shoulder blades and in her mid back.  Given her history of colon cancer, I would like GIs opinion on this.  I will also obtain a chest x-ray given the substernal chest pain and pressure that occurs for no reason.  If the above work-up is negative, I would recommend focusing her attention on treating the pain as a musculoskeletal source of pain which is my medical opinion.  I would next proceed with an MRI of the cervical spine and consider epidural steroid injections if in fact significant areas of arthritis are confirmed.  02/17/19 Patient canceled the referral to GI.  However she continues to have the pain.  She reports pain in her chest.  She also complains of pain in her neck after she eats.  The pain is located around the level of C7-T1.  Pain is not made  worse by palpation.  She associates this with after she eats.  She reports that she feels the pain radiates into her neck after she eats.  She denies any angina.  She denies any chest pain.  She denies any shortness of breath.  Her blood pressure today is adequately controlled.  The chest pain that she reports is more of a heartburn indigestion pain.  She reports burning in her chest.  She does not feel that the Protonix works as well as Nexium.  She would like to switch back to Nexium from the Protonix.  She also reports trouble sleeping at night.  She denies any abdominal pain nausea vomiting or diarrhea.  She denies any constipation Past Medical History:  Diagnosis Date   Cancer ([place]) 11/2007   colon stage II   Diverticula of colon      GERD (gastroesophageal reflux disease)    H/O colon cancer, stage II 11/15/2011   HH (hiatus hernia)    History of hiatal hernia    HOH (hard of hearing)    MILD LOSS   Hyperlipidemia    Hypothyroidism    Osteoporosis    Pneumonia    Past Surgical History:  Procedure Laterality Date   ABDOMINAL HYSTERECTOMY     APPENDECTOMY     CATARACT EXTRACTION W/PHACO Right 08/30/2015   Procedure: CATARACT EXTRACTION PHACO AND INTRAOCULAR LENS PLACEMENT ([place]);  Surgeon: Birder Robson, MD;  Location: [facility];  Service: Ophthalmology;  Laterality: Right;   [place]    00:40.8 AP%  15.7 CDE    6.42   fluid pack lot [id number]  exp 02/24/2017   CATARACT EXTRACTION W/PHACO Left 09/20/2015   Procedure: CATARACT EXTRACTION PHACO AND INTRAOCULAR LENS PLACEMENT (IOC);  Surgeon: Birder Robson, MD;  Location: [facility] OR[facility];  Service: Ophthalmology;  Laterality: Left;  [place] 44.8 AP% 19.6 CDE 8.77 FLUID PACK LOT # [id number]   CHOLECYSTECTOMY     ELBOW SURGERY     right    hemocolectomy  2009   VAGINAL PROLAPSE REPAIR     Current Outpatient Medications on File Prior to Visit  Medication Sig Dispense Refill   amLODipine (NORVASC) 10 MG tablet TAKE ONE TABLET BY MOUTH EVERY DAY ( DISCONTINUE LISINOPRIL HCTZ ) 90 tablet 3   ARMOUR THYROID 60 MG tablet TAKE ONE TABLET BY MOUTH EVERY MORNING BEFORE BREAKFAST 90 tablet 3   CALCIUM CITRATE PO Take 1 tablet by mouth 2 (two) times daily. 630mg      Cholecalciferol (VITAMIN D3) 2000 units TABS Take 1 tablet by mouth daily.     denosumab (PROLIA) 60 MG/ML SOLN injection Inject 60 mg into the skin every 6 (six) months. Administer in upper arm, thigh, or abdomen     fluticasone (FLONASE) 50 MCG/ACT nasal spray Place 2 sprays into both nostrils daily. 16 g 6   Magnesium 250 MG TABS Take 1 tablet by mouth daily.     Multiple Vitamin (MULTIVITAMIN) tablet Take 1 tablet by mouth daily.       vitamin B-12 (CYANOCOBALAMIN) 1000 MCG tablet Take 5,000 mcg  by mouth daily.     vitamin E 100 UNIT capsule Take 200 Units by mouth daily.     Current Facility-Administered Medications on File Prior to Visit  Medication Dose Route Frequency Provider Last Rate Last Dose   denosumab (PROLIA) injection 60 mg  60 mg Subcutaneous Q6 months Susy Frizzle, MD   60 mg at 08/13/18 1421   No Known Allergies Social History   Socioeconomic History  Marital status: Married    Spouse name: Not on file   Number of children: Not on file   Years of education: Not on file   Highest education level: Not on file  Occupational History   Not on file  Social Needs   Financial resource strain: Not on file   Food insecurity    Worry: Not on file    Inability: Not on file   Transportation needs    Medical: Not on file    Non-medical: Not on file  Tobacco Use   Smoking status: Never Smoker   Smokeless tobacco: Never Used  Substance and Sexual Activity   Alcohol use: Yes    Comment: occ   Drug use: No   Sexual activity: Yes    Comment: Married to [place], retired.  Lifestyle   Physical activity    Days per week: Not on file    Minutes per session: Not on file   Stress: Not on file  Relationships   Social connections    Talks on phone: Not on file    Gets together: Not on file    Attends religious service: Not on file    Active member of club or organization: Not on file    Attends meetings of clubs or organizations: Not on file    Relationship status: Not on file   Intimate partner violence    Fear of current or ex partner: Not on file    Emotionally abused: Not on file    Physically abused: Not on file    Forced sexual activity: Not on file  Other Topics Concern   Not on file  Social History Narrative   Not on file      Review of Systems  All other systems reviewed and are negative.      Objective:   Physical Exam Vitals signs reviewed.  Constitutional:      General: She is not in acute distress.     Appearance: Normal appearance. She is normal weight. She is not ill-appearing or toxic-appearing.  Cardiovascular:     Rate and Rhythm: Normal rate and regular rhythm.     Heart sounds: Normal heart sounds.  Pulmonary:     Effort: Pulmonary effort is normal. No respiratory distress.     Breath sounds: Normal breath sounds. No stridor. No wheezing or rhonchi.  Abdominal:     General: Abdomen is flat. Bowel sounds are normal. There is no distension.     Palpations: Abdomen is soft. There is no mass.     Tenderness: There is no abdominal tenderness. There is no guarding or rebound.     Hernia: No hernia is present.  Neurological:     Mental Status: She is alert.           Assessment & Plan:  Dyspepsia  Essential hypertension  Pure hypercholesterolemia  Hypothyroidism, unspecified type  Patient received her flu shot today.  She can return fasting at any time for a CBC, CMP, fasting lipid panel, and TSH.  I believe that the majority of her pain is arthritic in nature.  I have explained that to her previously however the only other potential cause I can think of would be functional dyspepsia.  Therefore we will try amitriptyline 25 mg p.o. nightly that may also help her with sleeping and see if this improves any of the pain that she states radiates into her back and neck after she eats.  If symptoms improve we could try increasing  the dose gradually up to a maximum of 75 mg a day if necessary.  Monitor for constipation, dry mouth, or confusion.

## 2019-02-18 ENCOUNTER — Other Ambulatory Visit: Payer: PPO

## 2019-02-18 ENCOUNTER — Other Ambulatory Visit: Payer: Self-pay

## 2019-02-18 DIAGNOSIS — I10 Essential (primary) hypertension: Secondary | ICD-10-CM

## 2019-02-18 DIAGNOSIS — E7800 Pure hypercholesterolemia, unspecified: Secondary | ICD-10-CM

## 2019-02-18 DIAGNOSIS — E039 Hypothyroidism, unspecified: Secondary | ICD-10-CM | POA: Diagnosis not present

## 2019-02-19 LAB — COMPREHENSIVE METABOLIC PANEL
AG Ratio: 1.3 (calc) (ref 1.0–2.5)
ALT: 11 U/L (ref 6–29)
AST: 17 U/L (ref 10–35)
Albumin: 4 g/dL (ref 3.6–5.1)
Alkaline phosphatase (APISO): 62 U/L (ref 37–153)
BUN: 9 mg/dL (ref 7–25)
CO2: 30 mmol/L (ref 20–32)
Calcium: 10 mg/dL (ref 8.6–10.4)
Chloride: 103 mmol/L (ref 98–110)
Creat: 0.68 mg/dL (ref 0.60–0.88)
Globulin: 3 g/dL (calc) (ref 1.9–3.7)
Glucose, Bld: 87 mg/dL (ref 65–99)
Potassium: 4.5 mmol/L (ref 3.5–5.3)
Sodium: 144 mmol/L (ref 135–146)
Total Bilirubin: 0.5 mg/dL (ref 0.2–1.2)
Total Protein: 7 g/dL (ref 6.1–8.1)

## 2019-02-19 LAB — CBC WITH DIFFERENTIAL/PLATELET
Absolute Monocytes: 539 cells/uL (ref 200–950)
Basophils Absolute: 62 cells/uL (ref 0–200)
Basophils Relative: 0.8 %
Eosinophils Absolute: 123 cells/uL (ref 15–500)
Eosinophils Relative: 1.6 %
HCT: 40.8 % (ref 35.0–45.0)
Hemoglobin: 13.1 g/dL (ref 11.7–15.5)
Lymphs Abs: 2002 cells/uL (ref 850–3900)
MCH: 28.1 pg (ref 27.0–33.0)
MCHC: 32.1 g/dL (ref 32.0–36.0)
MCV: 87.4 fL (ref 80.0–100.0)
MPV: 11 fL (ref 7.5–12.5)
Monocytes Relative: 7 %
Neutro Abs: 4974 cells/uL (ref 1500–7800)
Neutrophils Relative %: 64.6 %
Platelets: 253 10*3/uL (ref 140–400)
RBC: 4.67 10*6/uL (ref 3.80–5.10)
RDW: 12.8 % (ref 11.0–15.0)
Total Lymphocyte: 26 %
WBC: 7.7 10*3/uL (ref 3.8–10.8)

## 2019-02-19 LAB — LIPID PANEL
Cholesterol: 184 mg/dL (ref <200)
HDL: 70 mg/dL (ref >=50)
LDL Cholesterol (Calc): 100 mg/dL (calc) — ABNORMAL HIGH
Non-HDL Cholesterol (Calc): 114 mg/dL (calc) (ref <130)
Total CHOL/HDL Ratio: 2.6 (calc) (ref <5.0)
Triglycerides: 62 mg/dL (ref <150)

## 2019-02-19 LAB — TSH: TSH: 1.87 mIU/L (ref 0.40–4.50)

## 2019-05-28 ENCOUNTER — Other Ambulatory Visit: Payer: Self-pay | Admitting: Family Medicine

## 2019-05-28 DIAGNOSIS — Z1231 Encounter for screening mammogram for malignant neoplasm of breast: Secondary | ICD-10-CM

## 2019-06-10 ENCOUNTER — Other Ambulatory Visit: Payer: Self-pay | Admitting: Family Medicine

## 2019-06-10 DIAGNOSIS — Z780 Asymptomatic menopausal state: Secondary | ICD-10-CM

## 2019-06-10 DIAGNOSIS — M810 Age-related osteoporosis without current pathological fracture: Secondary | ICD-10-CM

## 2019-06-11 DIAGNOSIS — M3501 Sicca syndrome with keratoconjunctivitis: Secondary | ICD-10-CM | POA: Diagnosis not present

## 2019-07-07 ENCOUNTER — Other Ambulatory Visit: Payer: PPO

## 2019-07-07 ENCOUNTER — Telehealth: Payer: Self-pay | Admitting: Family Medicine

## 2019-07-07 MED ORDER — MECLIZINE HCL 25 MG PO TABS: 25.0000 mg | ORAL_TABLET | Freq: Three times a day (TID) | ORAL | 0 refills | Status: DC

## 2019-07-07 NOTE — Telephone Encounter (Signed)
Pt aware of recommendations and med sent to pharm

## 2019-07-07 NOTE — Telephone Encounter (Signed)
Pt states that she is having a lot of dizziness and was wondering if you would call her in something?

## 2019-07-07 NOTE — Telephone Encounter (Signed)
Meclizine 25 mg po q 8 hrs prn vertigo

## 2019-07-15 DIAGNOSIS — M3501 Sicca syndrome with keratoconjunctivitis: Secondary | ICD-10-CM | POA: Diagnosis not present

## 2019-07-17 DIAGNOSIS — J310 Chronic rhinitis: Secondary | ICD-10-CM | POA: Diagnosis not present

## 2019-07-17 DIAGNOSIS — H8111 Benign paroxysmal vertigo, right ear: Secondary | ICD-10-CM | POA: Diagnosis not present

## 2019-07-22 DIAGNOSIS — H8111 Benign paroxysmal vertigo, right ear: Secondary | ICD-10-CM | POA: Diagnosis not present

## 2019-07-28 DIAGNOSIS — Z681 Body mass index (BMI) 19 or less, adult: Secondary | ICD-10-CM | POA: Diagnosis not present

## 2019-07-28 DIAGNOSIS — Z01419 Encounter for gynecological examination (general) (routine) without abnormal findings: Secondary | ICD-10-CM | POA: Diagnosis not present

## 2019-07-29 DIAGNOSIS — H8111 Benign paroxysmal vertigo, right ear: Secondary | ICD-10-CM | POA: Diagnosis not present

## 2019-07-29 DIAGNOSIS — L299 Pruritus, unspecified: Secondary | ICD-10-CM | POA: Diagnosis not present

## 2019-07-29 DIAGNOSIS — R0981 Nasal congestion: Secondary | ICD-10-CM | POA: Diagnosis not present

## 2019-08-10 ENCOUNTER — Other Ambulatory Visit: Payer: Self-pay | Admitting: Family Medicine

## 2019-08-11 ENCOUNTER — Other Ambulatory Visit: Payer: PPO

## 2019-08-17 ENCOUNTER — Other Ambulatory Visit: Payer: PPO

## 2019-08-17 ENCOUNTER — Other Ambulatory Visit: Payer: Self-pay

## 2019-08-17 DIAGNOSIS — Z0000 Encounter for general adult medical examination without abnormal findings: Secondary | ICD-10-CM

## 2019-08-17 DIAGNOSIS — E039 Hypothyroidism, unspecified: Secondary | ICD-10-CM | POA: Diagnosis not present

## 2019-08-19 LAB — TEST AUTHORIZATION

## 2019-08-19 LAB — CBC WITH DIFFERENTIAL/PLATELET
Absolute Monocytes: 461 cells/uL (ref 200–950)
Basophils Absolute: 58 cells/uL (ref 0–200)
Basophils Relative: 0.9 %
Eosinophils Absolute: 109 cells/uL (ref 15–500)
Eosinophils Relative: 1.7 %
HCT: 39.8 % (ref 35.0–45.0)
Hemoglobin: 12.8 g/dL (ref 11.7–15.5)
Lymphs Abs: 2061 cells/uL (ref 850–3900)
MCH: 28.8 pg (ref 27.0–33.0)
MCHC: 32.2 g/dL (ref 32.0–36.0)
MCV: 89.6 fL (ref 80.0–100.0)
MPV: 11.2 fL (ref 7.5–12.5)
Monocytes Relative: 7.2 %
Neutro Abs: 3712 cells/uL (ref 1500–7800)
Neutrophils Relative %: 58 %
Platelets: 230 10*3/uL (ref 140–400)
RBC: 4.44 10*6/uL (ref 3.80–5.10)
RDW: 12.8 % (ref 11.0–15.0)
Total Lymphocyte: 32.2 %
WBC: 6.4 10*3/uL (ref 3.8–10.8)

## 2019-08-19 LAB — COMPREHENSIVE METABOLIC PANEL
AG Ratio: 1.1 (calc) (ref 1.0–2.5)
ALT: 13 U/L (ref 6–29)
AST: 20 U/L (ref 10–35)
Albumin: 4 g/dL (ref 3.6–5.1)
Alkaline phosphatase (APISO): 74 U/L (ref 37–153)
BUN: 11 mg/dL (ref 7–25)
CO2: 33 mmol/L — ABNORMAL HIGH (ref 20–32)
Calcium: 10.1 mg/dL (ref 8.6–10.4)
Chloride: 102 mmol/L (ref 98–110)
Creat: 0.79 mg/dL (ref 0.60–0.88)
Globulin: 3.5 g/dL (calc) (ref 1.9–3.7)
Glucose, Bld: 92 mg/dL (ref 65–99)
Potassium: 4.1 mmol/L (ref 3.5–5.3)
Sodium: 142 mmol/L (ref 135–146)
Total Bilirubin: 0.5 mg/dL (ref 0.2–1.2)
Total Protein: 7.5 g/dL (ref 6.1–8.1)

## 2019-08-19 LAB — LIPID PANEL
Cholesterol: 196 mg/dL (ref <200)
HDL: 67 mg/dL (ref >=50)
LDL Cholesterol (Calc): 113 mg/dL (calc) — ABNORMAL HIGH
Non-HDL Cholesterol (Calc): 129 mg/dL (calc) (ref <130)
Total CHOL/HDL Ratio: 2.9 (calc) (ref <5.0)
Triglycerides: 74 mg/dL (ref <150)

## 2019-08-19 LAB — TSH: TSH: 5.52 mIU/L — ABNORMAL HIGH (ref 0.40–4.50)

## 2019-08-21 ENCOUNTER — Ambulatory Visit (INDEPENDENT_AMBULATORY_CARE_PROVIDER_SITE_OTHER): Payer: PPO | Admitting: Family Medicine

## 2019-08-21 ENCOUNTER — Encounter: Payer: Self-pay | Admitting: Family Medicine

## 2019-08-21 ENCOUNTER — Other Ambulatory Visit: Payer: Self-pay

## 2019-08-21 VITALS — BP 122/64 | HR 78 | Temp 96.9°F | Resp 14 | Ht 62.5 in | Wt 108.0 lb

## 2019-08-21 DIAGNOSIS — K219 Gastro-esophageal reflux disease without esophagitis: Secondary | ICD-10-CM

## 2019-08-21 DIAGNOSIS — Z85038 Personal history of other malignant neoplasm of large intestine: Secondary | ICD-10-CM

## 2019-08-21 DIAGNOSIS — Z0001 Encounter for general adult medical examination with abnormal findings: Secondary | ICD-10-CM | POA: Diagnosis not present

## 2019-08-21 DIAGNOSIS — Z0000 Encounter for general adult medical examination without abnormal findings: Secondary | ICD-10-CM

## 2019-08-21 DIAGNOSIS — I10 Essential (primary) hypertension: Secondary | ICD-10-CM

## 2019-08-21 DIAGNOSIS — E7800 Pure hypercholesterolemia, unspecified: Secondary | ICD-10-CM | POA: Diagnosis not present

## 2019-08-21 DIAGNOSIS — E039 Hypothyroidism, unspecified: Secondary | ICD-10-CM | POA: Diagnosis not present

## 2019-08-21 MED ORDER — THYROID 15 MG PO TABS: 15.0000 mg | ORAL_TABLET | Freq: Every day | ORAL | 0 refills | Status: DC

## 2019-08-21 NOTE — Progress Notes (Signed)
Subjective:    Patient ID: Joanne Gomez, female    DOB: 03-Aug-1930, 86 y.o.   MRN: [id number]  Medication Refill   Here for CPE.  Patient has rescheduled her bone density test as well as her mammogram for April.  Therefore these are pending.  She does not require Pap smear any longer due to age.  Has history of colon cancer and last colonoscopy was 2015.  I would not recommend a repeat colonoscopy at this point also due to age.  She denies any depression.  She denies any falls.  She denies any memory loss.  She does report gas on a daily basis.  She states that if she can burp, the pain in the center of her chest will improve and go away.  However it will occur at random times and cause a tightness in her chest that radiates into her neck.  She has discussed this with me over the last 3 years.  I believe a lot of the pain in her neck is due to arthritis however she is convinced that it is gas pain given the fact that it will improve after she burps which certainly makes sense.  She has tried Beano with no relief.  Immunization History  Administered Date(s) Administered  . Fluad Quad(high Dose 65+) 02/17/2019  . Influenza, High Dose Seasonal PF 03/01/2017, 03/06/2018  . Influenza,inj,Quad PF,6+ Mos 03/10/2013, 03/08/2014, 03/17/2015, 03/13/2016  . Pneumococcal Conjugate-13 05/11/2013  . Pneumococcal Polysaccharide-23 05/31/2014  . Td 02/25/1996  . Tdap 05/03/2011  . Zoster 02/23/2011    Past Medical History:  Diagnosis Date  . Cancer ([place]) 11/2007   colon stage II  . Diverticula of colon   . GERD (gastroesophageal reflux disease)   . H/O colon cancer, stage II 11/15/2011  . HH (hiatus hernia)   . History of hiatal hernia   . HOH (hard of hearing)    MILD LOSS  . Hyperlipidemia   . Hypothyroidism   . Osteoporosis   . Pneumonia    Past Surgical History:  Procedure Laterality Date  . ABDOMINAL HYSTERECTOMY    . APPENDECTOMY    . CATARACT EXTRACTION W/PHACO Right 08/30/2015    Procedure: CATARACT EXTRACTION PHACO AND INTRAOCULAR LENS PLACEMENT (IOC);  Surgeon: Birder Robson, MD;  Location: [facility] ORS;  Service: Ophthalmology;  Laterality: Right;   [place]    00:40.8 AP%  15.7 CDE    6.42   fluid pack lot [id number]  exp 02/24/2017  . CATARACT EXTRACTION W/PHACO Left 09/20/2015   Procedure: CATARACT EXTRACTION PHACO AND INTRAOCULAR LENS PLACEMENT (IOC);  Surgeon: Birder Robson, MD;  Location: [facility] ORS;  Service: Ophthalmology;  Laterality: Left;  [place] 44.8 AP% 19.6 CDE 8.77 FLUID PACK LOT # [id number]  . CHOLECYSTECTOMY    . ELBOW SURGERY     right   . hemocolectomy  2009  . VAGINAL PROLAPSE REPAIR     Current Outpatient Medications on File Prior to Visit  Medication Sig Dispense Refill  . amitriptyline (ELAVIL) 25 MG tablet Take 1 tablet (25 mg total) by mouth at bedtime. 30 tablet 0  . amLODipine (NORVASC) 10 MG tablet TAKE ONE TABLET EVERY DAY (DISCONTINUE LISINOPRIL/HCTZ) 90 tablet 3  . ARMOUR THYROID 60 MG tablet TAKE ONE TABLET BY MOUTH EVERY MORNING BEFORE BREAKFAST 90 tablet 3  . CALCIUM CITRATE PO Take 1 tablet by mouth 2 (two) times daily. 630mg     . Cholecalciferol (VITAMIN D3) 2000 units TABS Take 1 tablet by mouth daily.    Marland Kitchen  denosumab (PROLIA) 60 MG/ML SOLN injection Inject 60 mg into the skin every 6 (six) months. Administer in upper arm, thigh, or abdomen    . esomeprazole (NEXIUM) 40 MG capsule Take 1 capsule (40 mg total) by mouth daily. 90 capsule 3  . Fluocinolone Acetonide 0.01 % OIL Place 3 drops in ear(s) 2 (two) times daily as needed. 20 mL 0  . fluticasone (FLONASE) 50 MCG/ACT nasal spray TAKE 2 SPRAYS INTO BOTH NOSTRILS DAILY 16 g 2  . Magnesium 250 MG TABS Take 1 tablet by mouth daily.    . meclizine (ANTIVERT) 25 MG tablet Take 1 tablet (25 mg total) by mouth every 8 (eight) hours. 30 tablet 0  . Multiple Vitamin (MULTIVITAMIN) tablet Take 1 tablet by mouth daily.      . vitamin B-12 (CYANOCOBALAMIN) 1000 MCG tablet Take 5,000 mcg by mouth  daily.    . vitamin E 100 UNIT capsule Take 200 Units by mouth daily.     Current Facility-Administered Medications on File Prior to Visit  Medication Dose Route Frequency Provider Last Rate Last Admin  . denosumab (PROLIA) injection 60 mg  60 mg Subcutaneous Q6 months Susy Frizzle, MD   60 mg at 08/13/18 1421   No Known Allergies Social History   Socioeconomic History  . Marital status: Married    Spouse name: Not on file  . Number of children: Not on file  . Years of education: Not on file  . Highest education level: Not on file  Occupational History  . Not on file  Tobacco Use  . Smoking status: Never Smoker  . Smokeless tobacco: Never Used  Substance and Sexual Activity  . Alcohol use: Yes    Comment: occ  . Drug use: No  . Sexual activity: Yes    Comment: Married to [place], retired.  Other Topics Concern  . Not on file  Social History Narrative  . Not on file   Social Determinants of Health   Financial Resource Strain:   . Difficulty of Paying Living Expenses:   Food Insecurity:   . Worried About Charity fundraiser in the Last Year:   . Arboriculturist in the Last Year:   Transportation Needs:   . Film/video editor (Medical):   Marland Kitchen Lack of Transportation (Non-Medical):   Physical Activity:   . Days of Exercise per Week:   . Minutes of Exercise per Session:   Stress:   . Feeling of Stress :   Social Connections:   . Frequency of Communication with Friends and Family:   . Frequency of Social Gatherings with Friends and Family:   . Attends Religious Services:   . Active Member of Clubs or Organizations:   . Attends Archivist Meetings:   Marland Kitchen Marital Status:   Intimate Partner Violence:   . Fear of Current or Ex-Partner:   . Emotionally Abused:   Marland Kitchen Physically Abused:   . Sexually Abused:    Family History  Problem Relation Age of Onset  . Heart attack Sister 40  . Hyperlipidemia Sister   . Hypertension Sister   . Breast cancer Neg Hx        Review of Systems  All other systems reviewed and are negative.      Objective:   Physical Exam  Constitutional: She is oriented to person, place, and time. She appears well-developed and well-nourished. No distress.  HENT:  Head: Normocephalic and atraumatic.  Right Ear: External ear normal.  Left Ear: External ear normal.  Nose: Nose normal.  Mouth/Throat: Oropharynx is clear and moist. No oropharyngeal exudate.  Eyes: Pupils are equal, round, and reactive to light. Conjunctivae and EOM are normal. Left eye exhibits no discharge. No scleral icterus.  Neck: No JVD present. No tracheal deviation present. No thyromegaly present.  Cardiovascular: Normal rate, regular rhythm, normal heart sounds and intact distal pulses. Exam reveals no gallop and no friction rub.  No murmur heard. Pulmonary/Chest: Effort normal and breath sounds normal. No stridor. No respiratory distress. She has no wheezes. She has no rales. She exhibits no tenderness.  Abdominal: Soft. Bowel sounds are normal. She exhibits no distension and no mass. There is no abdominal tenderness. There is no rebound and no guarding.  Musculoskeletal:        General: No tenderness or edema. Normal range of motion.     Cervical back: Normal range of motion and neck supple.  Lymphadenopathy:    She has no cervical adenopathy.  Neurological: She is alert and oriented to person, place, and time. She has normal reflexes. No cranial nerve deficit. She exhibits normal muscle tone. Coordination normal.  Skin: Skin is warm. No rash noted. No erythema. No pallor.  Psychiatric: She has a normal mood and affect. Her behavior is normal. Judgment and thought content normal.  Vitals reviewed.         Assessment & Plan:  Essential hypertension  Pure hypercholesterolemia  Hypothyroidism, unspecified type  Gastroesophageal reflux disease without esophagitis  H/O colon cancer, stage II  General medical exam  Given the  chronicity of the gas symptoms, I do not feel that this is cardiac in nature.  I believe this is most likely GI and therefore we will try Maalox every day to see if this helps.  If not we could consider an EGD through GI versus trying sucralfate.  Patient does have a hiatal hernia and I believe this is the most likely contributing factor.  Cholesterol is acceptable.  Blood pressure is outstanding.  We will increase the dose of her Armour Thyroid to 75 mg a day and recheck TSH in 3 months.  Recommended against a colonoscopy or Pap smear.  Mammogram and bone density test have already been performed.  Immunizations are up-to-date.  Most recent lab work is listed below.  Patient denies any depression, falls, or memory loss. Lab on 08/17/2019  Component Date Value Ref Range Status  . Glucose, Bld 08/17/2019 92  65 - 99 mg/dL Final   Comment: .            Fasting reference interval .   . BUN 08/17/2019 11  7 - 25 mg/dL Final  . Creat 08/17/2019 0.79  0.60 - 0.88 mg/dL Final   Comment: For patients >50 years of age, the reference limit for Creatinine is approximately 13% higher for people identified as African-American. .   Havery Moros Ratio [id number] NOT APPLICABLE  6 - 22 (calc) Final  . Sodium 08/17/2019 142  135 - 146 mmol/L Final  . Potassium 08/17/2019 4.1  3.5 - 5.3 mmol/L Final  . Chloride 08/17/2019 102  98 - 110 mmol/L Final  . CO2 08/17/2019 33* 20 - 32 mmol/L Final  . Calcium 08/17/2019 10.1  8.6 - 10.4 mg/dL Final  . Total Protein 08/17/2019 7.5  6.1 - 8.1 g/dL Final  . Albumin 08/17/2019 4.0  3.6 - 5.1 g/dL Final  . Globulin 08/17/2019 3.5  1.9 - 3.7 g/dL (calc) Final  . AG  Ratio 08/17/2019 1.1  1.0 - 2.5 (calc) Final  . Total Bilirubin 08/17/2019 0.5  0.2 - 1.2 mg/dL Final  . Alkaline phosphatase (APISO) 08/17/2019 74  37 - 153 U/L Final  . AST 08/17/2019 20  10 - 35 U/L Final  . ALT 08/17/2019 13  6 - 29 U/L Final  . Cholesterol 08/17/2019 196  <200 mg/dL Final  . HDL  08/17/2019 67  > OR = 50 mg/dL Final  . Triglycerides 08/17/2019 74  <150 mg/dL Final  . LDL Cholesterol (Calc) 08/17/2019 113* mg/dL (calc) Final   Comment: Reference range: <100 . Desirable range <100 mg/dL for primary prevention;   <70 mg/dL for patients with CHD or diabetic patients  with > or = 2 CHD risk factors. Marland Kitchen LDL-C is now calculated using the Martin-Hopkins  calculation, which is a validated novel method providing  better accuracy than the Friedewald equation in the  estimation of LDL-C.  Cresenciano Genre et al. Annamaria Helling. [id number]): 2061-2068  (http://education.[facility].com/faq/FAQ164)   . Total CHOL/HDL Ratio 08/17/2019 2.9  <5.0 (calc) Final  . Non-HDL Cholesterol (Calc) 08/17/2019 129  <130 mg/dL (calc) Final   Comment: For patients with diabetes plus 1 major ASCVD risk  factor, treating to a non-HDL-C goal of <100 mg/dL  (LDL-C of <70 mg/dL) is considered a therapeutic  option.   . WBC 08/17/2019 6.4  3.8 - 10.8 Thousand/uL Final  . RBC 08/17/2019 4.44  3.80 - 5.10 Million/uL Final  . Hemoglobin 08/17/2019 12.8  11.7 - 15.5 g/dL Final  . HCT 08/17/2019 39.8  35.0 - 45.0 % Final  . MCV 08/17/2019 89.6  80.0 - 100.0 fL Final  . MCH 08/17/2019 28.8  27.0 - 33.0 pg Final  . MCHC 08/17/2019 32.2  32.0 - 36.0 g/dL Final  . RDW 08/17/2019 12.8  11.0 - 15.0 % Final  . Platelets 08/17/2019 230  140 - 400 Thousand/uL Final  . MPV 08/17/2019 11.2  7.5 - 12.5 fL Final  . Neutro Abs 08/17/2019 3,712  1,500 - 7,800 cells/uL Final  . Lymphs Abs 08/17/2019 2,061  850 - 3,900 cells/uL Final  . Absolute Monocytes 08/17/2019 461  200 - 950 cells/uL Final  . Eosinophils Absolute 08/17/2019 109  15 - 500 cells/uL Final  . Basophils Absolute 08/17/2019 58  0 - 200 cells/uL Final  . Neutrophils Relative % 08/17/2019 58  % Final  . Total Lymphocyte 08/17/2019 32.2  % Final  . Monocytes Relative 08/17/2019 7.2  % Final  . Eosinophils Relative 08/17/2019 1.7  % Final  . Basophils  Relative 08/17/2019 0.9  % Final  . TSH 08/17/2019 5.52* 0.40 - 4.50 mIU/L Final  . TEST NAME: 08/17/2019 TSH   Final  . TEST CODE: 08/17/2019 [id number]   Final  . CLIENT CONTACT: 08/17/2019 Learta Codding   Final  . REPORT ALWAYS MESSAGE SIGNATURE 08/17/2019    Final   Comment: . The laboratory testing on this patient was verbally requested or confirmed by the ordering physician or his or her authorized representative after contact with an employee of [employer]. Federal regulations require that we maintain on file written authorization for all laboratory testing.  Accordingly we are asking that the ordering physician or his or her authorized representative sign a copy of this report and promptly return it to the client service representative. . . Signature:____________________________________________________ . Please fax this signed page to [phone] or return it via your [employer] courier.

## 2019-09-01 ENCOUNTER — Ambulatory Visit
Admission: RE | Admit: 2019-09-01 | Discharge: 2019-09-01 | Disposition: A | Discharge status: Home / Self Care | Payer: PPO | Source: Ambulatory Visit | Attending: Family Medicine | Admitting: Family Medicine

## 2019-09-01 DIAGNOSIS — Z780 Asymptomatic menopausal state: Secondary | ICD-10-CM | POA: Diagnosis not present

## 2019-09-01 DIAGNOSIS — M810 Age-related osteoporosis without current pathological fracture: Secondary | ICD-10-CM

## 2019-09-01 DIAGNOSIS — Z1231 Encounter for screening mammogram for malignant neoplasm of breast: Secondary | ICD-10-CM | POA: Diagnosis not present

## 2019-09-16 DIAGNOSIS — M3501 Sicca syndrome with keratoconjunctivitis: Secondary | ICD-10-CM | POA: Diagnosis not present

## 2019-09-21 DIAGNOSIS — M6281 Muscle weakness (generalized): Secondary | ICD-10-CM | POA: Diagnosis not present

## 2019-09-21 DIAGNOSIS — H8113 Benign paroxysmal vertigo, bilateral: Secondary | ICD-10-CM | POA: Diagnosis not present

## 2019-09-21 DIAGNOSIS — M542 Cervicalgia: Secondary | ICD-10-CM | POA: Diagnosis not present

## 2019-09-24 ENCOUNTER — Other Ambulatory Visit: Payer: Self-pay

## 2019-09-24 ENCOUNTER — Telehealth: Payer: Self-pay | Admitting: Family Medicine

## 2019-09-24 DIAGNOSIS — H8113 Benign paroxysmal vertigo, bilateral: Secondary | ICD-10-CM | POA: Diagnosis not present

## 2019-09-24 DIAGNOSIS — M6281 Muscle weakness (generalized): Secondary | ICD-10-CM | POA: Diagnosis not present

## 2019-09-24 DIAGNOSIS — M542 Cervicalgia: Secondary | ICD-10-CM | POA: Diagnosis not present

## 2019-09-24 NOTE — Telephone Encounter (Signed)
CB# [phone] Refill Prolia

## 2019-09-25 NOTE — Telephone Encounter (Signed)
Had to get this approved through insurance and will notify her when she can come in to receive the prolia

## 2019-09-28 DIAGNOSIS — M542 Cervicalgia: Secondary | ICD-10-CM | POA: Diagnosis not present

## 2019-10-01 DIAGNOSIS — M542 Cervicalgia: Secondary | ICD-10-CM | POA: Diagnosis not present

## 2019-10-08 DIAGNOSIS — M542 Cervicalgia: Secondary | ICD-10-CM | POA: Diagnosis not present

## 2019-10-15 DIAGNOSIS — M542 Cervicalgia: Secondary | ICD-10-CM | POA: Diagnosis not present

## 2019-10-19 ENCOUNTER — Ambulatory Visit (INDEPENDENT_AMBULATORY_CARE_PROVIDER_SITE_OTHER): Payer: PPO | Admitting: Family Medicine

## 2019-10-19 ENCOUNTER — Other Ambulatory Visit: Payer: Self-pay

## 2019-10-19 ENCOUNTER — Ambulatory Visit
Admission: RE | Admit: 2019-10-19 | Discharge: 2019-10-19 | Disposition: A | Discharge status: Home / Self Care | Payer: PPO | Source: Ambulatory Visit | Attending: Family Medicine | Admitting: Family Medicine

## 2019-10-19 ENCOUNTER — Ambulatory Visit
Admission: RE | Admit: 2019-10-19 | Discharge: 2019-10-19 | Disposition: A | Discharge status: Home / Self Care | Payer: PPO | Attending: Family Medicine | Admitting: Family Medicine

## 2019-10-19 VITALS — BP 140/80 | HR 95 | Temp 98.2°F | Wt 106.0 lb

## 2019-10-19 DIAGNOSIS — R0789 Other chest pain: Secondary | ICD-10-CM | POA: Diagnosis not present

## 2019-10-19 DIAGNOSIS — R079 Chest pain, unspecified: Secondary | ICD-10-CM | POA: Diagnosis not present

## 2019-10-19 NOTE — Progress Notes (Signed)
Subjective:    Patient ID: Joanne Gomez, female    DOB: [birth year], 86 y.o.   MRN: [id number]  Patient is here today requesting a referral to a cardiologist.  I question the patient as to the nature of her problem.  She states that she saw a physical therapist who suggested that she believes the patient needed to see a cardiologist.  I asked why.  The patient was uncertain.  She denies any shortness of breath that is new.  She denies any dyspnea on exertion that is new.  She has occasional chest pain however this is been a chronic problem for the last 3 to 4 years and it usually radiates from her neck into her chest.  However she states that over the last few weeks she has developed an atypical chest pain.  It is a sharp prickly feeling in the center of her chest.  It is unrelated to activity or exertion.  It lasts seconds and resolve spontaneously.  I asked her if she mentioned this to the physical therapist and if that is why they felt she needed to see a cardiologist and the patient was confused and uncertain.  I looked at the last communication I had with a physical therapist from April and there was no mention of a cardiology consultation.  The patient denies any palpitations or tachycardia.  She denies any syncope or near syncope.  She denies any orthopnea or paroxysmal nocturnal dyspnea however she does have occasional periods where she feels like she cannot "take a full breath".  She denies any angina. Past Medical History:  Diagnosis Date  . Cancer ([place]) 11/2007   colon stage II  . Diverticula of colon   . GERD (gastroesophageal reflux disease)   . H/O colon cancer, stage II 11/15/2011  . HH (hiatus hernia)   . History of hiatal hernia   . HOH (hard of hearing)    MILD LOSS  . Hyperlipidemia   . Hypothyroidism   . Osteoporosis   . Pneumonia    Past Surgical History:  Procedure Laterality Date  . ABDOMINAL HYSTERECTOMY    . APPENDECTOMY    . CATARACT EXTRACTION W/PHACO Right  08/30/2015   Procedure: CATARACT EXTRACTION PHACO AND INTRAOCULAR LENS PLACEMENT (IOC);  Surgeon: Birder Robson, MD;  Location: [facility] ORS;  Service: Ophthalmology;  Laterality: Right;   [place]    00:40.8 AP%  15.7 CDE    6.42   fluid pack lot [id number]  exp 02/24/2017  . CATARACT EXTRACTION W/PHACO Left 09/20/2015   Procedure: CATARACT EXTRACTION PHACO AND INTRAOCULAR LENS PLACEMENT (IOC);  Surgeon: Birder Robson, MD;  Location: [facility] ORS;  Service: Ophthalmology;  Laterality: Left;  [place] 44.8 AP% 19.6 CDE 8.77 FLUID PACK LOT # [id number]  . CHOLECYSTECTOMY    . ELBOW SURGERY     right   . hemocolectomy  2009  . VAGINAL PROLAPSE REPAIR     Current Outpatient Medications on File Prior to Visit  Medication Sig Dispense Refill  . amLODipine (NORVASC) 10 MG tablet TAKE ONE TABLET EVERY DAY (DISCONTINUE LISINOPRIL/HCTZ) 90 tablet 3  . ARMOUR THYROID 60 MG tablet TAKE ONE TABLET BY MOUTH EVERY MORNING BEFORE BREAKFAST 90 tablet 3  . CALCIUM CITRATE PO Take 1 tablet by mouth 2 (two) times daily. 630mg     . Cholecalciferol (VITAMIN D3) 2000 units TABS Take 1 tablet by mouth daily.    Marland Kitchen esomeprazole (NEXIUM) 40 MG capsule Take 1 capsule (40 mg total) by mouth daily. [place]  capsule 3  . Fluocinolone Acetonide 0.01 % OIL Place 3 drops in ear(s) 2 (two) times daily as needed. 20 mL 0  . fluticasone (FLONASE) 50 MCG/ACT nasal spray TAKE 2 SPRAYS INTO BOTH NOSTRILS DAILY 16 g 2  . Magnesium 250 MG TABS Take 1 tablet by mouth daily.    . meclizine (ANTIVERT) 25 MG tablet Take 1 tablet (25 mg total) by mouth every 8 (eight) hours. 30 tablet 0  . Multiple Vitamin (MULTIVITAMIN) tablet Take 1 tablet by mouth daily.      Marland Kitchen thyroid (ARMOUR THYROID) 15 MG tablet Take 1 tablet (15 mg total) by mouth daily. 90 tablet 0  . vitamin B-12 (CYANOCOBALAMIN) 1000 MCG tablet Take 5,000 mcg by mouth daily.    . vitamin E 100 UNIT capsule Take 200 Units by mouth daily.     No current facility-administered medications on file  prior to visit.   No Known Allergies Social History   Socioeconomic History  . Marital status: Married    Spouse name: Not on file  . Number of children: Not on file  . Years of education: Not on file  . Highest education level: Not on file  Occupational History  . Not on file  Tobacco Use  . Smoking status: Never Smoker  . Smokeless tobacco: Never Used  Substance and Sexual Activity  . Alcohol use: Yes    Comment: occ  . Drug use: No  . Sexual activity: Yes    Comment: Married to [place], retired.  Other Topics Concern  . Not on file  Social History Narrative  . Not on file   Social Determinants of Health   Financial Resource Strain:   . Difficulty of Paying Living Expenses:   Food Insecurity:   . Worried About Charity fundraiser in the Last Year:   . Arboriculturist in the Last Year:   Transportation Needs:   . Film/video editor (Medical):   Marland Kitchen Lack of Transportation (Non-Medical):   Physical Activity:   . Days of Exercise per Week:   . Minutes of Exercise per Session:   Stress:   . Feeling of Stress :   Social Connections:   . Frequency of Communication with Friends and Family:   . Frequency of Social Gatherings with Friends and Family:   . Attends Religious Services:   . Active Member of Clubs or Organizations:   . Attends Archivist Meetings:   Marland Kitchen Marital Status:   Intimate Partner Violence:   . Fear of Current or Ex-Partner:   . Emotionally Abused:   Marland Kitchen Physically Abused:   . Sexually Abused:       Review of Systems  All other systems reviewed and are negative.      Objective:   Physical Exam Vitals reviewed.  Constitutional:      General: She is not in acute distress.    Appearance: Normal appearance. She is normal weight. She is not ill-appearing or toxic-appearing.  Cardiovascular:     Rate and Rhythm: Normal rate and regular rhythm.     Heart sounds: Normal heart sounds.  Pulmonary:     Effort: Pulmonary effort is normal. No  respiratory distress.     Breath sounds: Normal breath sounds. No stridor. No wheezing or rhonchi.  Abdominal:     General: Abdomen is flat. Bowel sounds are normal. There is no distension.     Palpations: Abdomen is soft. There is no mass.  Tenderness: There is no abdominal tenderness. There is no guarding or rebound.     Hernia: No hernia is present.  Neurological:     Mental Status: She is alert.           Assessment & Plan:  Atypical chest pain - Plan: EKG 12-Lead  The sharp prickly sensation in the center of her chest that lasts a few seconds and resolve spontaneously which is unrelated to exertion does not sound cardiac in nature.  Furthermore I have not received any communication from the physical therapist suggesting a cardiology referral.  Also the patient is not sure why they want her to see a cardiologist and on her exam today I do not see any concerning findings.  I will obtain an EKG.  I also believe that given her occasional shortness of breath and what sounds like musculoskeletal chest pain that the patient would benefit from a chest x-ray.  I believe an echocardiogram is reasonable given her occasional shortness of breath.  If all of these are normal, I do not feel that the patient would warrant any other invasive cardiac testing at the present time. EKG shows NSR with no evidence of ischemia or infarction.

## 2019-10-22 DIAGNOSIS — M542 Cervicalgia: Secondary | ICD-10-CM | POA: Diagnosis not present

## 2019-10-27 DIAGNOSIS — M542 Cervicalgia: Secondary | ICD-10-CM | POA: Diagnosis not present

## 2019-10-29 DIAGNOSIS — M542 Cervicalgia: Secondary | ICD-10-CM | POA: Diagnosis not present

## 2019-10-30 ENCOUNTER — Other Ambulatory Visit: Payer: Self-pay

## 2019-10-30 ENCOUNTER — Ambulatory Visit
Admission: RE | Admit: 2019-10-30 | Discharge: 2019-10-30 | Disposition: A | Discharge status: Home / Self Care | Payer: PPO | Source: Ambulatory Visit | Attending: Family Medicine | Admitting: Family Medicine

## 2019-10-30 DIAGNOSIS — R0789 Other chest pain: Secondary | ICD-10-CM | POA: Diagnosis not present

## 2019-10-30 DIAGNOSIS — I358 Other nonrheumatic aortic valve disorders: Secondary | ICD-10-CM | POA: Insufficient documentation

## 2019-10-30 DIAGNOSIS — E785 Hyperlipidemia, unspecified: Secondary | ICD-10-CM | POA: Insufficient documentation

## 2019-10-30 DIAGNOSIS — I517 Cardiomegaly: Secondary | ICD-10-CM | POA: Diagnosis not present

## 2019-10-30 NOTE — Progress Notes (Signed)
*  PRELIMINARY RESULTS* Echocardiogram 2D Echocardiogram has been performed.  Sherrie Sport 10/30/2019, 11:03 AM

## 2019-11-02 DIAGNOSIS — M542 Cervicalgia: Secondary | ICD-10-CM | POA: Diagnosis not present

## 2019-11-03 DIAGNOSIS — X32XXXA Exposure to sunlight, initial encounter: Secondary | ICD-10-CM | POA: Diagnosis not present

## 2019-11-03 DIAGNOSIS — D485 Neoplasm of uncertain behavior of skin: Secondary | ICD-10-CM | POA: Diagnosis not present

## 2019-11-03 DIAGNOSIS — L570 Actinic keratosis: Secondary | ICD-10-CM | POA: Diagnosis not present

## 2019-11-03 DIAGNOSIS — Z85828 Personal history of other malignant neoplasm of skin: Secondary | ICD-10-CM | POA: Diagnosis not present

## 2019-11-03 DIAGNOSIS — D2261 Melanocytic nevi of right upper limb, including shoulder: Secondary | ICD-10-CM | POA: Diagnosis not present

## 2019-11-03 DIAGNOSIS — L821 Other seborrheic keratosis: Secondary | ICD-10-CM | POA: Diagnosis not present

## 2019-11-03 DIAGNOSIS — D2262 Melanocytic nevi of left upper limb, including shoulder: Secondary | ICD-10-CM | POA: Diagnosis not present

## 2019-11-03 DIAGNOSIS — D0439 Carcinoma in situ of skin of other parts of face: Secondary | ICD-10-CM | POA: Diagnosis not present

## 2019-11-03 DIAGNOSIS — L661 Lichen planopilaris: Secondary | ICD-10-CM | POA: Diagnosis not present

## 2019-11-03 DIAGNOSIS — D2271 Melanocytic nevi of right lower limb, including hip: Secondary | ICD-10-CM | POA: Diagnosis not present

## 2019-11-05 DIAGNOSIS — M542 Cervicalgia: Secondary | ICD-10-CM | POA: Diagnosis not present

## 2019-11-12 DIAGNOSIS — M542 Cervicalgia: Secondary | ICD-10-CM | POA: Diagnosis not present

## 2019-11-16 ENCOUNTER — Other Ambulatory Visit: Payer: PPO

## 2019-11-16 ENCOUNTER — Other Ambulatory Visit: Payer: Self-pay

## 2019-11-16 DIAGNOSIS — M542 Cervicalgia: Secondary | ICD-10-CM | POA: Diagnosis not present

## 2019-11-17 ENCOUNTER — Other Ambulatory Visit: Payer: PPO

## 2019-11-17 DIAGNOSIS — E039 Hypothyroidism, unspecified: Secondary | ICD-10-CM

## 2019-11-18 LAB — TSH: TSH: 0.24 mIU/L — ABNORMAL LOW (ref 0.40–4.50)

## 2019-11-19 DIAGNOSIS — M542 Cervicalgia: Secondary | ICD-10-CM | POA: Diagnosis not present

## 2019-11-20 ENCOUNTER — Telehealth: Payer: Self-pay | Admitting: Family Medicine

## 2019-11-20 ENCOUNTER — Other Ambulatory Visit: Payer: Self-pay

## 2019-11-20 ENCOUNTER — Ambulatory Visit (INDEPENDENT_AMBULATORY_CARE_PROVIDER_SITE_OTHER): Payer: PPO | Admitting: Family Medicine

## 2019-11-20 VITALS — BP 130/70 | HR 94 | Temp 98.4°F | Wt 104.0 lb

## 2019-11-20 DIAGNOSIS — E039 Hypothyroidism, unspecified: Secondary | ICD-10-CM

## 2019-11-20 NOTE — Progress Notes (Signed)
°  Chronic Care Management   Outreach Note  11/20/2019 Name: Joanne Gomez MRN: [id number] DOB: [birth year]  Referred by: Susy Frizzle, MD Reason for referral : Chronic Care Management (Initial CCM Outreach)   An unsuccessful telephone outreach was attempted today. The patient was referred to the pharmacist for assistance with care management and care coordination.   Follow Up Plan:   [place]

## 2019-11-20 NOTE — Progress Notes (Signed)
Subjective:    Patient ID: Joanne Gomez, female    DOB: Jan 19, 1931, 86 y.o.   MRN: [id number]  Lab on 11/17/2019  Component Date Value Ref Range Status  . TSH 11/17/2019 0.24* 0.40 - 4.50 mIU/L Final   Here for a follow up of her hypothyroidism.  Please see labs above.  Was taking armour thyroid 75 mg daily (60+15).  Was asymptomatic and doing well.  Recent echo was normal for age:  1. Left ventricular ejection fraction, by estimation, is 55 to 60%. The  left ventricle has normal function. The left ventricle has no regional  wall motion abnormalities. There is mild left ventricular hypertrophy.  Left ventricular diastolic parameters  were normal.  2. Right ventricular systolic function is normal. The right ventricular  size is normal. There is normal pulmonary artery systolic pressure.  3. The mitral valve is normal in structure. Trivial mitral valve  regurgitation. No evidence of mitral stenosis.  4. The aortic valve is normal in structure. Aortic valve regurgitation is  not visualized. Mild aortic valve sclerosis is present, with no evidence  of aortic valve stenosis.  5. The inferior vena cava is normal in size with greater than 50%  respiratory variability, suggesting right atrial pressure of 3 mmHg.   FINDINGS  Left Ventricle: Left ventricular ejection fraction, by estimation, is 55  to 60%. The left ventricle has normal function. The left ventricle has no  regional wall motion abnormalities. The left ventricular internal cavity  size was normal in size. There is  mild left ventricular hypertrophy. Left ventricular diastolic parameters  were normal.   Right Ventricle: The right ventricular size is normal. No increase in  right ventricular wall thickness. Right ventricular systolic function is  normal. There is normal pulmonary artery systolic pressure. The tricuspid  regurgitant velocity is 2.63 m/s, and  with an assumed right atrial pressure of 3 mmHg, the  estimated right  ventricular systolic pressure is 43.3 mmHg.   Left Atrium: Left atrial size was normal in size.   Right Atrium: Right atrial size was normal in size.   Pericardium: There is no evidence of pericardial effusion.   Mitral Valve: The mitral valve is normal in structure. Normal mobility of  the mitral valve leaflets. Trivial mitral valve regurgitation. No evidence  of mitral valve stenosis.   Tricuspid Valve: The tricuspid valve is normal in structure. Tricuspid  valve regurgitation is trivial. No evidence of tricuspid stenosis.   Aortic Valve: The aortic valve is normal in structure. Aortic valve  regurgitation is not visualized. Mild aortic valve sclerosis is present,  with no evidence of aortic valve stenosis. Aortic valve mean gradient  measures 3.0 mmHg. Aortic valve peak  gradient measures 5.6 mmHg. Aortic valve area, by VTI measures 2.24 cm.   Pulmonic Valve: The pulmonic valve was normal in structure. Pulmonic valve  regurgitation is not visualized. No evidence of pulmonic stenosis.   Aorta: The aortic root is normal in size and structure.   Venous: The inferior vena cava is normal in size with greater than 50%  respiratory variability, suggesting right atrial pressure of 3 mmHg.   Past Medical History:  Diagnosis Date  . Cancer ([place]) 11/2007   colon stage II  . Diverticula of colon   . GERD (gastroesophageal reflux disease)   . H/O colon cancer, stage II 11/15/2011  . HH (hiatus hernia)   . History of hiatal hernia   . HOH (hard of hearing)    MILD  LOSS  . Hyperlipidemia   . Hypothyroidism   . Osteoporosis   . Pneumonia    Past Surgical History:  Procedure Laterality Date  . ABDOMINAL HYSTERECTOMY    . APPENDECTOMY    . CATARACT EXTRACTION W/PHACO Right 08/30/2015   Procedure: CATARACT EXTRACTION PHACO AND INTRAOCULAR LENS PLACEMENT (IOC);  Surgeon: Birder Robson, MD;  Location: [facility] ORS;  Service: Ophthalmology;  Laterality: Right;   [place]     00:40.8 AP%  15.7 CDE    6.42   fluid pack lot #[id number]  exp 02/24/2017  . CATARACT EXTRACTION W/PHACO Left 09/20/2015   Procedure: CATARACT EXTRACTION PHACO AND INTRAOCULAR LENS PLACEMENT (IOC);  Surgeon: Birder Robson, MD;  Location: [facility] ORS;  Service: Ophthalmology;  Laterality: Left;  [place] 44.8 AP% 19.6 CDE 8.77 FLUID PACK LOT # [id number]  . CHOLECYSTECTOMY    . ELBOW SURGERY     right   . hemocolectomy  2009  . VAGINAL PROLAPSE REPAIR     Current Outpatient Medications on File Prior to Visit  Medication Sig Dispense Refill  . amLODipine (NORVASC) 10 MG tablet TAKE ONE TABLET EVERY DAY (DISCONTINUE LISINOPRIL/HCTZ) 90 tablet 3  . ARMOUR THYROID 60 MG tablet TAKE ONE TABLET BY MOUTH EVERY MORNING BEFORE BREAKFAST 90 tablet 3  . CALCIUM CITRATE PO Take 1 tablet by mouth 2 (two) times daily. 630mg     . Cholecalciferol (VITAMIN D3) 2000 units TABS Take 1 tablet by mouth daily.    Marland Kitchen esomeprazole (NEXIUM) 40 MG capsule Take 1 capsule (40 mg total) by mouth daily. 90 capsule 3  . Fluocinolone Acetonide 0.01 % OIL Place 3 drops in ear(s) 2 (two) times daily as needed. 20 mL 0  . fluticasone (FLONASE) 50 MCG/ACT nasal spray TAKE 2 SPRAYS INTO BOTH NOSTRILS DAILY 16 g 2  . Magnesium 250 MG TABS Take 1 tablet by mouth daily.    . meclizine (ANTIVERT) 25 MG tablet Take 1 tablet (25 mg total) by mouth every 8 (eight) hours. 30 tablet 0  . Multiple Vitamin (MULTIVITAMIN) tablet Take 1 tablet by mouth daily.      Marland Kitchen thyroid (ARMOUR THYROID) 15 MG tablet Take 1 tablet (15 mg total) by mouth daily. 90 tablet 0  . vitamin B-12 (CYANOCOBALAMIN) 1000 MCG tablet Take 5,000 mcg by mouth daily.    . vitamin E 100 UNIT capsule Take 200 Units by mouth daily.     No current facility-administered medications on file prior to visit.   No Known Allergies Social History   Socioeconomic History  . Marital status: Married    Spouse name: Not on file  . Number of children: Not on file  . Years of  education: Not on file  . Highest education level: Not on file  Occupational History  . Not on file  Tobacco Use  . Smoking status: Never Smoker  . Smokeless tobacco: Never Used  Substance and Sexual Activity  . Alcohol use: Yes    Comment: occ  . Drug use: No  . Sexual activity: Yes    Comment: Married to [place], retired.  Other Topics Concern  . Not on file  Social History Narrative  . Not on file   Social Determinants of Health   Financial Resource Strain:   . Difficulty of Paying Living Expenses:   Food Insecurity:   . Worried About Charity fundraiser in the Last Year:   . Arboriculturist in the Last Year:   Transportation Needs:   .  Lack of Transportation (Medical):   Marland Kitchen Lack of Transportation (Non-Medical):   Physical Activity:   . Days of Exercise per Week:   . Minutes of Exercise per Session:   Stress:   . Feeling of Stress :   Social Connections:   . Frequency of Communication with Friends and Family:   . Frequency of Social Gatherings with Friends and Family:   . Attends Religious Services:   . Active Member of Clubs or Organizations:   . Attends Archivist Meetings:   Marland Kitchen Marital Status:   Intimate Partner Violence:   . Fear of Current or Ex-Partner:   . Emotionally Abused:   Marland Kitchen Physically Abused:   . Sexually Abused:       Review of Systems  All other systems reviewed and are negative.      Objective:   Physical Exam Vitals reviewed.  Constitutional:      General: She is not in acute distress.    Appearance: Normal appearance. She is normal weight. She is not ill-appearing or toxic-appearing.  Cardiovascular:     Rate and Rhythm: Normal rate and regular rhythm.     Heart sounds: Normal heart sounds.  Pulmonary:     Effort: Pulmonary effort is normal. No respiratory distress.     Breath sounds: Normal breath sounds. No stridor. No wheezing or rhonchi.  Abdominal:     General: Abdomen is flat. Bowel sounds are normal. There is no  distension.     Palpations: Abdomen is soft. There is no mass.     Tenderness: There is no abdominal tenderness. There is no guarding or rebound.     Hernia: No hernia is present.  Neurological:     Mental Status: She is alert.           Assessment & Plan:  Hypothyroidism, unspecified type  Decrease armour thyroid to 60 mg daily and recheck tsh in 6 weeks.

## 2019-11-23 DIAGNOSIS — M542 Cervicalgia: Secondary | ICD-10-CM | POA: Diagnosis not present

## 2019-11-26 ENCOUNTER — Other Ambulatory Visit: Payer: Self-pay | Admitting: Family Medicine

## 2019-11-26 DIAGNOSIS — M542 Cervicalgia: Secondary | ICD-10-CM | POA: Diagnosis not present

## 2019-12-02 ENCOUNTER — Telehealth: Payer: Self-pay | Admitting: Family Medicine

## 2019-12-02 NOTE — Progress Notes (Signed)
°  Chronic Care Management   Outreach Note  12/02/2019 Name: Joanne Gomez MRN: [id number] DOB: 01/27/1931  Referred by: Susy Frizzle, MD Reason for referral : No chief complaint on file.   Third unsuccessful telephone outreach was attempted today. The patient was referred to the pharmacist for assistance with care management and care coordination.   Follow Up Plan:   [place]

## 2019-12-02 NOTE — Progress Notes (Signed)
  Chronic Care Management   Note  12/02/2019 Name: Joanne Gomez MRN: [id number] DOB: Jul 01, 1930  Joanne Gomez is a 86 y.o. year old female who is a primary care patient of Susy Frizzle, MD. I reached out to [name] by phone today in response to a referral sent by Joanne Gomez's PCP, Susy Frizzle, MD.   Joanne Gomez was given information about Chronic Care Management services today including:  1. CCM service includes personalized support from designated clinical staff supervised by her physician, including individualized plan of care and coordination with other care providers 2. 24/7 contact phone numbers for assistance for urgent and routine care needs. 3. Service will only be billed when office clinical staff spend 20 minutes or more in a month to coordinate care. 4. Only one practitioner may furnish and bill the service in a calendar month. 5. The patient may stop CCM services at any time (effective at the end of the month) by phone call to the office staff.   Patient agreed to services and verbal consent obtained.   Follow up plan:   [place]

## 2019-12-03 DIAGNOSIS — M542 Cervicalgia: Secondary | ICD-10-CM | POA: Diagnosis not present

## 2019-12-07 DIAGNOSIS — M542 Cervicalgia: Secondary | ICD-10-CM | POA: Diagnosis not present

## 2019-12-14 DIAGNOSIS — M542 Cervicalgia: Secondary | ICD-10-CM | POA: Diagnosis not present

## 2019-12-16 DIAGNOSIS — M3501 Sicca syndrome with keratoconjunctivitis: Secondary | ICD-10-CM | POA: Diagnosis not present

## 2019-12-17 ENCOUNTER — Encounter: Payer: Self-pay | Admitting: *Deleted

## 2019-12-17 DIAGNOSIS — M542 Cervicalgia: Secondary | ICD-10-CM | POA: Diagnosis not present

## 2019-12-18 DIAGNOSIS — M542 Cervicalgia: Secondary | ICD-10-CM | POA: Diagnosis not present

## 2019-12-21 DIAGNOSIS — M542 Cervicalgia: Secondary | ICD-10-CM | POA: Diagnosis not present

## 2019-12-24 DIAGNOSIS — M542 Cervicalgia: Secondary | ICD-10-CM | POA: Diagnosis not present

## 2019-12-28 DIAGNOSIS — M542 Cervicalgia: Secondary | ICD-10-CM | POA: Diagnosis not present

## 2019-12-29 DIAGNOSIS — M542 Cervicalgia: Secondary | ICD-10-CM | POA: Diagnosis not present

## 2019-12-30 ENCOUNTER — Other Ambulatory Visit: Payer: PPO

## 2019-12-31 DIAGNOSIS — M542 Cervicalgia: Secondary | ICD-10-CM | POA: Diagnosis not present

## 2020-01-06 DIAGNOSIS — D0439 Carcinoma in situ of skin of other parts of face: Secondary | ICD-10-CM | POA: Diagnosis not present

## 2020-01-11 DIAGNOSIS — M542 Cervicalgia: Secondary | ICD-10-CM | POA: Diagnosis not present

## 2020-01-14 DIAGNOSIS — M542 Cervicalgia: Secondary | ICD-10-CM | POA: Diagnosis not present

## 2020-01-18 DIAGNOSIS — M542 Cervicalgia: Secondary | ICD-10-CM | POA: Diagnosis not present

## 2020-01-21 DIAGNOSIS — M542 Cervicalgia: Secondary | ICD-10-CM | POA: Diagnosis not present

## 2020-01-25 DIAGNOSIS — M542 Cervicalgia: Secondary | ICD-10-CM | POA: Diagnosis not present

## 2020-01-28 DIAGNOSIS — M542 Cervicalgia: Secondary | ICD-10-CM | POA: Diagnosis not present

## 2020-02-02 ENCOUNTER — Other Ambulatory Visit: Payer: PPO

## 2020-02-02 ENCOUNTER — Other Ambulatory Visit: Payer: Self-pay

## 2020-02-02 DIAGNOSIS — E039 Hypothyroidism, unspecified: Secondary | ICD-10-CM | POA: Diagnosis not present

## 2020-02-03 LAB — TSH: TSH: 3.39 mIU/L (ref 0.40–4.50)

## 2020-02-04 ENCOUNTER — Other Ambulatory Visit: Payer: Self-pay | Admitting: *Deleted

## 2020-02-04 DIAGNOSIS — E039 Hypothyroidism, unspecified: Secondary | ICD-10-CM

## 2020-02-04 DIAGNOSIS — I10 Essential (primary) hypertension: Secondary | ICD-10-CM

## 2020-02-04 DIAGNOSIS — K5229 Other allergic and dietetic gastroenteritis and colitis: Secondary | ICD-10-CM

## 2020-02-04 DIAGNOSIS — M542 Cervicalgia: Secondary | ICD-10-CM | POA: Diagnosis not present

## 2020-02-04 DIAGNOSIS — Z780 Asymptomatic menopausal state: Secondary | ICD-10-CM

## 2020-02-04 DIAGNOSIS — Z85038 Personal history of other malignant neoplasm of large intestine: Secondary | ICD-10-CM

## 2020-02-04 DIAGNOSIS — M810 Age-related osteoporosis without current pathological fracture: Secondary | ICD-10-CM

## 2020-02-04 DIAGNOSIS — K219 Gastro-esophageal reflux disease without esophagitis: Secondary | ICD-10-CM

## 2020-02-04 DIAGNOSIS — E7800 Pure hypercholesterolemia, unspecified: Secondary | ICD-10-CM

## 2020-02-05 ENCOUNTER — Telehealth: Payer: Self-pay

## 2020-02-05 NOTE — Chronic Care Management (AMB) (Addendum)
Chronic Care Management Pharmacy  Name: Joanne Gomez  MRN: [id number] DOB: [birth year]  Chief Complaint/ HPI  Joanne Gomez,  86 y.o. , female presents for their Initial CCM visit with the clinical pharmacist In office.  PCP : Susy Frizzle, MD  Their chronic conditions include: HTN, GERD, Hypothyroidism, Osteoporosis.  Office Visits: 11/20/2019 ([place]) -  Armour thyroid was decreased to 60mg  daily and recheck TSH in 6 weeks Upon recheck TSH was normal, so now on 60mg  as permanent dose  10/19/2019 ([place]) -  Patient complains of atypical chest pain Wanted cardiologist referral, PCP does not see any evidence of cardiovascular issues EKG showed no signs of ischemia or infarction  08/21/2019 ([place]) - CPE Complains of neck pain, believed to be arthritic BP controlled, given increased dose of Armour Thyroid She is to try Maalox daily to see if this helps with GI discomfort   Medications: Outpatient Encounter Medications as of 02/09/2020  Medication Sig   amLODipine (NORVASC) 10 MG tablet TAKE ONE TABLET EVERY DAY (DISCONTINUE LISINOPRIL/HCTZ)   ARMOUR THYROID 60 MG tablet TAKE ONE TABLET EVERY MORNING BEFORE BREAKFAST   CALCIUM CITRATE PO Take 1 tablet by mouth 2 (two) times daily. 630mg    Cholecalciferol (VITAMIN D3) 2000 units TABS Take 1 tablet by mouth daily.   Fluocinolone Acetonide 0.01 % OIL Place 3 drops in ear(s) 2 (two) times daily as needed.   Magnesium 250 MG TABS Take 1 tablet by mouth daily.   meclizine (ANTIVERT) 25 MG tablet Take 1 tablet (25 mg total) by mouth every 8 (eight) hours.   Multiple Vitamin (MULTIVITAMIN) tablet Take 1 tablet by mouth daily.     thyroid (ARMOUR THYROID) 15 MG tablet Take 1 tablet (15 mg total) by mouth daily.   vitamin B-12 (CYANOCOBALAMIN) 1000 MCG tablet Take 5,000 mcg by mouth daily.   vitamin E 100 UNIT capsule Take 200 Units by mouth daily.   esomeprazole (NEXIUM) 40 MG capsule Take 1 capsule (40 mg total) by  mouth daily. (Patient not taking: Reported on 02/09/2020)   fluticasone (FLONASE) 50 MCG/ACT nasal spray TAKE 2 SPRAYS INTO BOTH NOSTRILS DAILY (Patient not taking: Reported on 02/09/2020)   No facility-administered encounter medications on file as of 02/09/2020.     Current Diagnosis/Assessment:  Goals Addressed             This Visit's Progress    Pharmacy Care Plan:       CARE PLAN ENTRY (see longitudinal plan of care for additional care plan information)  Current Barriers:  Chronic Disease Management support, education, and care coordination needs related to Hypertension, GERD, Hypothyroidism, and Osteoporosis   Hypertension BP Readings from Last 3 Encounters:  11/20/19 130/70  10/19/19 140/80  08/21/19 122/64   Pharmacist Clinical Goal(s): Over the next 180 days, patient will work with PharmD and providers to maintain BP goal <140/90 Current regimen:  Amlodipine 10mg  Interventions: Comprehensive medication review Reviewed home monitoring Patient self care activities - Over the next 180 days, patient will: Check BP periodically, document, and provide at future appointments Ensure daily salt intake < 2300 mg/day Continue to be active at senior center in physical therapy  GERD Pharmacist Clinical Goal(s) Over the next 180 days, patient will work with PharmD and providers to optimize medication and minimize symptoms related to GERD Current regimen:  Tums as needed Interventions: Discussed frequency of PPI use Encouraged her to continue to use Tums prn to treat symptoms if long term PPI not needed Patient  self care activities - Over the next 180 days, patient will: Treat intermittent symptoms with Tums Notify providers with any sudden change in symptoms  Hypothyroidism Pharmacist Clinical Goal(s) Over the next 180 days, patient will work with PharmD and providers to optimize medication and minimize symptoms related to hypothyroidism Current regimen:  Armour  Thyroid 60mg  Interventions: Reviewed most recent TSH Discussed copay for Armour Thyroid Discussed proper timing of medication Patient self care activities - Over the next 180 days, patient will: Continue to focus on medication adherence by pill box Notify providers with any issues with cost  Osteoporosis Pharmacist Clinical Goal(s) Over the next 180 days, patient will work with PharmD and providers to optimize medication and minimize symptoms of osteoporosis. Current regimen:  Calcium + Vitamin D Interventions: Recommended resuming her Prolia injections Reviewed most recent bone density scan Patient self care activities - Over the next 180 days, patient will: Resume Prolia injections Notify PharmD if cost is and issue Initial goal documentation         Hypertension   BP goal is:  <140/90  Office blood pressures are  BP Readings from Last 3 Encounters:  11/20/19 130/70  10/19/19 140/80  08/21/19 122/64   Patient checks BP at home  periodically Patient home BP readings are ranging: no logs available, patient reports WNL  Patient has failed these meds in the past: none noted Patient is currently controlled on the following medications:  Amlodipine 10mg  daily  She reports adherence to medication, denies dizziness, headaches.  Denies swelling in ankles.  BP in office WNL, she checks at home "every now and then," and reports always normal.  No concerns per patient.  Plan  Continue current medications     Hypothyroidism   Lab Results  Component Value Date/Time   TSH 3.39 02/02/2020 08:44 AM   TSH 0.24 (L) 11/17/2019 10:06 AM   FREET4 0.87 10/01/2012 11:50 AM    Patient has failed these meds in past: none noted Patient is currently controlled on the following medications:  Armour Thyroid 60mg   Recently d/c 15mg  Armour thyroid d/t low TSH.  Upon recheck TSH returned to normal.  She takes appropriately in the morning on an empty stomach.  Does mention high copay,  however she is hesitant to replace with alternative due to length of time she has been on this medication.  Plan  Continue current medications  Osteoporosis   Last DEXA Scan: 09/01/2019  T-Score femoral neck: -2.6   T-Score lumbar spine: -3.6   Vit D, 25-Hydroxy  Date Value Ref Range Status  01/08/2017 36 30 - 100 ng/mL Final    Comment:    Vitamin D Status           25-OH Vitamin D        Deficiency                <20 ng/mL        Insufficiency         20 - 29 ng/mL        Optimal             > or = 30 ng/mL   For 25-OH Vitamin D testing on patients on D2-supplementation and patients for whom quantitation of D2 and D3 fractions is required, the QuestAssureD 25-OH VIT D, (D2,D3), LC/MS/MS is recommended: order code [phone] (patients > 2 yrs).      Patient is a candidate for pharmacologic treatment due to T-Score < -2.5 in femoral neck and  T-Score < -2.5 in lumbar spine  Patient has failed these meds in past: Fosamax Patient is currently uncontrolled on the following medications:  Calcium citrate Vitamin D3 2000 IU  She had previously been on Prolia injections, however due to COVID-19 and then getting her COVID-19 injections she has missed a few doses.  Recommended she start these back as soon as possible, patient agreeable to plan and plans to restart.  She is not opposed to the injections just missed a few.  Currently taking calcium/vitamin D and I advised her to continue.  Also doing physical therapy at the senior center a few times per week which is great.  Plan  Continue current medications GERD   Patient has failed these meds in past: none noted Patient is currently controlled on the following medications:  Tums prn  Patient mentions her acid reflux is currently well controlled.  She does not currently take esomeprazole only a Tums prn.  Inquired about stopping PPI altogether.  If she has not been taking as she reported I am ok with her stopping and treating  intermittent symptoms with Tums.  Plan  Continue using Tums prn, for now she can stop PPI and contact provider's should symptoms change. Vaccines   Reviewed and discussed patient's vaccination history.    Immunization History  Administered Date(s) Administered   Fluad Quad(high Dose 65+) 02/17/2019   Influenza, High Dose Seasonal PF 03/01/2017, 03/06/2018   Influenza,inj,Quad PF,6+ Mos 03/10/2013, 03/08/2014, 03/17/2015, 03/13/2016   PFIZER SARS-COV-2 Vaccination 06/15/2019, 07/06/2019   Pneumococcal Conjugate-13 05/11/2013   Pneumococcal Polysaccharide-23 05/31/2014   Td 02/25/1996   Tdap 05/03/2011   Zoster 02/23/2011    Plan  Recommended patient receive Shingrix vaccine in pharmacy Medication Management   Miscellaneous medications: none OTC's:  Vitamin B-12 Vitamin E Meclizine 25mg  Patient currently uses [facility] pharmacy.  Phone #  [phone] Patient reports using pill box with timers to organize medications and promote adherence. Patient denies missed doses of medication.  Beverly Milch, PharmD Clinical Pharmacist [place] [phone]  I have collaborated with the care management provider regarding care management and care coordination activities outlined in this encounter and have reviewed this encounter including documentation in the note and care plan. I am certifying that I agree with the content of this note and encounter as supervising physician.

## 2020-02-08 ENCOUNTER — Telehealth: Payer: Self-pay | Admitting: Pharmacist

## 2020-02-08 DIAGNOSIS — M542 Cervicalgia: Secondary | ICD-10-CM | POA: Diagnosis not present

## 2020-02-08 NOTE — Progress Notes (Signed)
    Chronic Care Management Pharmacy Assistant   Name: Joanne Gomez  MRN: [id number] DOB: 10/13/1930  Reason for Encounter: Medication Review  Patient Questions:  1.  Have you seen any other providers since your last visit? Yes, Dr. Birder Robson (Ophthalmologist), 01/06/20 -Dr. Kirkland Hun (Dermatology-Skin Cancer)  2.  Any changes in your medicines or health? No   Geroge Gomez,  86 y.o. , female presents for their Initial CCM visit with the clinical pharmacist In office.  PCP : Susy Frizzle, MD  Allergies:  No Known Allergies  Medications: Outpatient Encounter Medications as of 02/08/2020  Medication Sig  . amLODipine (NORVASC) 10 MG tablet TAKE ONE TABLET EVERY DAY (DISCONTINUE LISINOPRIL/HCTZ)  . ARMOUR THYROID 60 MG tablet TAKE ONE TABLET EVERY MORNING BEFORE BREAKFAST  . CALCIUM CITRATE PO Take 1 tablet by mouth 2 (two) times daily. 630mg   . Cholecalciferol (VITAMIN D3) 2000 units TABS Take 1 tablet by mouth daily.  Marland Kitchen esomeprazole (NEXIUM) 40 MG capsule Take 1 capsule (40 mg total) by mouth daily.  . Fluocinolone Acetonide 0.01 % OIL Place 3 drops in ear(s) 2 (two) times daily as needed.  . fluticasone (FLONASE) 50 MCG/ACT nasal spray TAKE 2 SPRAYS INTO BOTH NOSTRILS DAILY  . Magnesium 250 MG TABS Take 1 tablet by mouth daily.  . meclizine (ANTIVERT) 25 MG tablet Take 1 tablet (25 mg total) by mouth every 8 (eight) hours.  . Multiple Vitamin (MULTIVITAMIN) tablet Take 1 tablet by mouth daily.    Marland Kitchen thyroid (ARMOUR THYROID) 15 MG tablet Take 1 tablet (15 mg total) by mouth daily.  . vitamin B-12 (CYANOCOBALAMIN) 1000 MCG tablet Take 5,000 mcg by mouth daily.  . vitamin E 100 UNIT capsule Take 200 Units by mouth daily.   No facility-administered encounter medications on file as of 02/08/2020.    Current Diagnosis: Patient Active Problem List   Diagnosis Date Noted  . Chest pain at rest 03/26/2014  . GERD (gastroesophageal reflux disease) 03/26/2014  .  Essential hypertension 03/26/2014  . Osteoporosis   . Hypothyroidism   . H/O colon cancer, stage II 11/15/2011    Goals Addressed   None     Follow-Up:  [facility]   Have you seen any other providers since your last visit? Dr. Birder Robson (Ophthalmologist), 01/06/20 -Dr. Kirkland Hun (Dermatology-Skin Cancer) Any changes in your medications or health? no Any side effects from any medications? no Do you have an symptoms or problems not managed by your medications? Yes, patient states she has inflammation/arthritis in her body. Any concerns about your health right now? Yes, patient is concerned about the inflammation/arthritis. Has your provider asked that you check blood pressure, blood sugar, or follow special diet at home? Patient used to check BP at home and kept a log but no longer does so. Do you get any type of exercise on a regular basis? Yes, physical therapy once/twice wkly at senior center Can you think of a goal you would like to reach for your health? No current goal. Do you have any problems getting your medications? No. Is there anything that you would like to discuss during the appointment? Patient would like to discuss information about Prolia, the covid booster shot, and the flu shot.  Please bring medications and supplements to appointment

## 2020-02-09 ENCOUNTER — Ambulatory Visit: Payer: PPO | Admitting: Pharmacist

## 2020-02-09 ENCOUNTER — Other Ambulatory Visit: Payer: Self-pay

## 2020-02-09 ENCOUNTER — Ambulatory Visit: Payer: PPO | Admitting: Family Medicine

## 2020-02-09 DIAGNOSIS — E039 Hypothyroidism, unspecified: Secondary | ICD-10-CM

## 2020-02-09 DIAGNOSIS — K219 Gastro-esophageal reflux disease without esophagitis: Secondary | ICD-10-CM

## 2020-02-09 DIAGNOSIS — I10 Essential (primary) hypertension: Secondary | ICD-10-CM

## 2020-02-09 DIAGNOSIS — M810 Age-related osteoporosis without current pathological fracture: Secondary | ICD-10-CM

## 2020-02-09 NOTE — Patient Instructions (Addendum)
Visit Information Thank you for meeting with me today!  I look forward to working with you to help you meet all of your healthcare goals and answer any questions you may have.  Feel free to contact me anytime!  Goals Addressed            This Visit's Progress   . Pharmacy Care Plan:       CARE PLAN ENTRY (see longitudinal plan of care for additional care plan information)  Current Barriers:  . Chronic Disease Management support, education, and care coordination needs related to Hypertension, GERD, Hypothyroidism, and Osteoporosis   Hypertension BP Readings from Last 3 Encounters:  11/20/19 130/70  10/19/19 140/80  08/21/19 122/64   . Pharmacist Clinical Goal(s): o Over the next 180 days, patient will work with PharmD and providers to maintain BP goal <140/90 . Current regimen:  o Amlodipine 10mg  . Interventions: o Comprehensive medication review o Reviewed home monitoring . Patient self care activities - Over the next 180 days, patient will: o Check BP periodically, document, and provide at future appointments o Ensure daily salt intake < 2300 mg/day o [facility]  GERD . Pharmacist Clinical Goal(s) o Over the next 180 days, patient will work with PharmD and providers to optimize medication and minimize symptoms related to GERD . Current regimen:  o Tums as needed . Interventions: o Discussed frequency of PPI use o Encouraged her to continue to use Tums prn to treat symptoms if long term PPI not needed . Patient self care activities - Over the next 180 days, patient will: o Treat intermittent symptoms with Tums o Notify providers with any sudden change in symptoms  Hypothyroidism . Pharmacist Clinical Goal(s) o Over the next 180 days, patient will work with PharmD and providers to optimize medication and minimize symptoms related to hypothyroidism . Current regimen:  o Armour Thyroid 60mg  . Interventions: o Reviewed  most recent TSH o Discussed copay for Armour Thyroid o Discussed proper timing of medication . Patient self care activities - Over the next 180 days, patient will: o Continue to focus on medication adherence by pill box o Notify providers with any issues with cost  Osteoporosis . Pharmacist Clinical Goal(s) o Over the next 180 days, patient will work with PharmD and providers to optimize medication and minimize symptoms of osteoporosis. . Current regimen:  o Calcium + Vitamin D . Interventions: o Recommended resuming her Prolia injections o Reviewed most recent bone density scan . Patient self care activities - Over the next 180 days, patient will: o Resume Prolia injections o Notify PharmD if cost is and issue Initial goal documentation        Ms. Moede was given information about Chronic Care Management services today including:  1. CCM service includes personalized support from designated clinical staff supervised by her physician, including individualized plan of care and coordination with other care providers 2. 24/7 contact phone numbers for assistance for urgent and routine care needs. 3. Standard insurance, coinsurance, copays and deductibles apply for chronic care management only during months in which we provide at least 20 minutes of these services. Most insurances cover these services at 100%, however patients may be responsible for any copay, coinsurance and/or deductible if applicable. This service may help you avoid the need for more expensive face-to-face services. 4. Only one practitioner may furnish and bill the service in a calendar month. 5. The patient may stop CCM services at any  time (effective at the end of the month) by phone call to the office staff.  Patient agreed to services and verbal consent obtained.   The patient verbalized understanding of instructions provided today and agreed to receive a mailed copy of patient instruction and/or educational  materials. Telephone follow up appointment with pharmacy team member scheduled for: 6 months  Beverly Milch, PharmD Clinical Pharmacist [facility] [phone]   Preventing Osteoporosis, Adult Osteoporosis is a condition that causes the bones to lose density. This means that the bones become thinner, and the normal spaces in bone tissue become larger. Low bone density can make the bones weak and cause them to break more easily. Osteoporosis cannot always be prevented, but you can take steps to lower your risk of developing this condition. How can this condition affect me? If you develop osteoporosis, you will be more likely to break bones in your wrist, spine, or hip. Even a minor accident or injury can be enough to break weak bones. The bones will also be slower to heal. Osteoporosis can cause other problems as well, such as a stooped posture or trouble with movement. Osteoporosis can occur with aging. As you get older, you may lose bone tissue more quickly, or it may be replaced more slowly. Osteoporosis is more likely to develop if you have poor nutrition or do not get enough calcium or vitamin D. Other lifestyle factors can also play a role. By eating a well-balanced diet and making lifestyle changes, you can help keep your bones strong and healthy, lowering your chances of developing osteoporosis. What can increase my risk? The following factors may make you more likely to develop osteoporosis:  Having a family history of the condition.  Having poor nutrition or not getting enough calcium or vitamin D.  Using certain medicines, such as steroid medicines or antiseizure medicines.  Being any of the following: ? 54 years of age or older. ? Female. ? A woman who has gone through menopause (is postmenopausal). ? White (Caucasian) or of Asian descent.  Smoking or having a history of smoking.  Not being physically active (being sedentary).  Having a small body  frame. What actions can I take to prevent this?  Get enough calcium   Make sure you get enough calcium every day. Calcium is the most important mineral for bone health. Most people can get enough calcium from their diet, but supplements may be recommended for people who are at risk for osteoporosis. Follow these guidelines: ? If you are age 54 or younger, aim to get 1,000 mg of calcium every day. ? If you are older than age 69, aim to get 1,200 mg of calcium every day.  Good sources of calcium include: ? Dairy products, such as low-fat or nonfat milk, cheese, and yogurt. ? Dark green leafy vegetables, such as bok choy and broccoli. ? Foods that have had calcium added to them (calcium-fortified foods), such as orange juice, cereal, bread, soy beverages, and tofu products. ? Nuts, such as almonds.  Check nutrition labels to see how much calcium is in a food or drink. Get enough vitamin D  Try to get enough vitamin D every day. Vitamin D is the most essential vitamin for bone health. It helps the body absorb calcium. Follow these guidelines for how much vitamin D to get from food: ? If you are age 101 or younger, aim to get at least 600 international units (IU) every day. Your health care provider may  suggest more. ? If you are older than age 2, aim to get at least 800 international units every day. Your health care provider may suggest more.  Good sources of vitamin D in your diet include: ? Egg yolks. ? Oily fish, such as salmon, sardines, and tuna. ? Milk and cereal fortified with vitamin D.  Your body also makes vitamin D when you are out in the sun. Exposing the bare skin on your face, arms, legs, or back to the sun for no more than 30 minutes a day, 2 times a week is more than enough. Beyond that, make sure you use sunblock to protect your skin from sunburn, which increases your risk for skin cancer. Exercise  Stay active and get exercise every day.  Ask your health care provider  what types of exercise are best for you. Weight-bearing and strength-building activities are important for building and maintaining healthy bones. Some examples of these types of activities include: ? Walking and hiking. ? Jogging and running. ? Dancing. ? Gym exercises. ? Lifting weights. ? Tennis and racquetball. ? Climbing stairs. ? Aerobics. Make other lifestyle changes  Do not use any products that contain nicotine or tobacco, such as cigarettes, e-cigarettes, and chewing tobacco. If you need help quitting, ask your health care provider.  Lose weight if you are overweight.  If you drink alcohol: ? Limit how much you use to:  0-1 drink a day for nonpregnant women.  0-2 drinks a day for men. ? Be aware of how much alcohol is in your drink. In the [place] one drink equals one 12 oz bottle of beer (355 mL), one 5 oz glass of wine (148 mL), or one 1 oz glass of hard liquor (44 mL). Where to find support If you need help making changes to prevent osteoporosis, talk with your health care provider. You can ask for a referral to a diet and nutrition specialist (dietitian) and a physical therapist. Where to find more information Learn more about osteoporosis from:  NIH Osteoporosis and Related [place]: www.bones.SouthExposed.es  [place]S. Office on [employer]: [place].tn  [place]: EquipmentWeekly.com.ee Summary  Osteoporosis is a condition that causes weak bones that are more likely to break.  Eat a healthy diet, making sure you get enough calcium and vitamin D, and stay active by getting regular exercise to help prevent osteoporosis.  Other ways to reduce your risk of osteoporosis include maintaining a healthy weight and avoiding alcohol and products that contain nicotine or tobacco. This information is not intended to replace advice given to you by your health care provider. Make sure you discuss any questions you have with your health  care provider. Document Revised: 12/12/2018 Document Reviewed: 12/12/2018 Elsevier Patient Education  [place].

## 2020-02-10 NOTE — Telephone Encounter (Signed)
error 

## 2020-02-15 DIAGNOSIS — M542 Cervicalgia: Secondary | ICD-10-CM | POA: Diagnosis not present

## 2020-02-16 ENCOUNTER — Ambulatory Visit (INDEPENDENT_AMBULATORY_CARE_PROVIDER_SITE_OTHER): Payer: PPO | Admitting: Family Medicine

## 2020-02-16 ENCOUNTER — Other Ambulatory Visit: Payer: Self-pay

## 2020-02-16 VITALS — BP 110/70 | HR 84 | Temp 98.2°F | Ht 62.0 in | Wt 105.0 lb

## 2020-02-16 DIAGNOSIS — E039 Hypothyroidism, unspecified: Secondary | ICD-10-CM | POA: Diagnosis not present

## 2020-02-16 DIAGNOSIS — Z23 Encounter for immunization: Secondary | ICD-10-CM

## 2020-02-16 DIAGNOSIS — M810 Age-related osteoporosis without current pathological fracture: Secondary | ICD-10-CM

## 2020-02-16 NOTE — Progress Notes (Signed)
Subjective:    Patient ID: Joanne Gomez, female    DOB: 1931-05-01, 86 y.o.   MRN: [id number]  Patient is a very sweet 86 year old Caucasian female who is here today for a follow-up of her hypothyroidism.  At her last visit we reduced her dose of Armour Thyroid due to low TSH.  She had lab work drawn prior to her visit: Lab on 02/02/2020  Component Date Value Ref Range Status   TSH 02/02/2020 3.39  0.40 - 4.50 mIU/L Final    TSH was shown to be in the therapeutic range.  Overall she is doing well.  Her weight remains stable at around 105 pounds.  Her blood pressure is excellent.  She denies any chest pain or shortness of breath or dyspnea on exertion.  She is due for her flu shot today.  She is also overdue for Prolia shot however she is hesitant to receive that and prefers to wait at the present time. Past Medical History:  Diagnosis Date   Cancer ([place]) 11/2007   colon stage II   Diverticula of colon    GERD (gastroesophageal reflux disease)    H/O colon cancer, stage II 11/15/2011   HH (hiatus hernia)    History of hiatal hernia    HOH (hard of hearing)    MILD LOSS   Hyperlipidemia    Hypothyroidism    Osteoporosis    Pneumonia    Past Surgical History:  Procedure Laterality Date   ABDOMINAL HYSTERECTOMY     APPENDECTOMY     CATARACT EXTRACTION W/PHACO Right 08/30/2015   Procedure: CATARACT EXTRACTION PHACO AND INTRAOCULAR LENS PLACEMENT ([place]);  Surgeon: Birder Robson, MD;  Location: [facility] ORS;  Service: Ophthalmology;  Laterality: Right;   [place]    00:40.8 AP%  15.7 CDE    6.42   fluid pack lot #[id number]  exp 02/24/2017   CATARACT EXTRACTION W/PHACO Left 09/20/2015   Procedure: CATARACT EXTRACTION PHACO AND INTRAOCULAR LENS PLACEMENT ([facility]);  Surgeon: Birder Robson, MD;  Location: [facility] ORS;  Service: Ophthalmology;  Laterality: Left;  [place] 44.8 AP% 19.6 CDE 8.77 FLUID PACK LOT # [id number]   CHOLECYSTECTOMY     ELBOW SURGERY     right    hemocolectomy   2009   VAGINAL PROLAPSE REPAIR     Current Outpatient Medications on File Prior to Visit  Medication Sig Dispense Refill   amLODipine (NORVASC) 10 MG tablet TAKE ONE TABLET EVERY DAY (DISCONTINUE LISINOPRIL/HCTZ) 90 tablet 3   ARMOUR THYROID 60 MG tablet TAKE ONE TABLET EVERY MORNING BEFORE BREAKFAST 90 tablet 1   CALCIUM CITRATE PO Take 1 tablet by mouth 2 (two) times daily. 630mg      Cholecalciferol (VITAMIN D3) 2000 units TABS Take 1 tablet by mouth daily.     esomeprazole (NEXIUM) 40 MG capsule Take 1 capsule (40 mg total) by mouth daily. (Patient not taking: Reported on 02/09/2020) 90 capsule 3   Fluocinolone Acetonide 0.01 % OIL Place 3 drops in ear(s) 2 (two) times daily as needed. 20 mL 0   fluticasone (FLONASE) 50 MCG/ACT nasal spray TAKE 2 SPRAYS INTO BOTH NOSTRILS DAILY (Patient not taking: Reported on 02/09/2020) 16 g 2   Magnesium 250 MG TABS Take 1 tablet by mouth daily.     meclizine (ANTIVERT) 25 MG tablet Take 1 tablet (25 mg total) by mouth every 8 (eight) hours. 30 tablet 0   Multiple Vitamin (MULTIVITAMIN) tablet Take 1 tablet by mouth daily.  thyroid (ARMOUR THYROID) 15 MG tablet Take 1 tablet (15 mg total) by mouth daily. 90 tablet 0   vitamin B-12 (CYANOCOBALAMIN) 1000 MCG tablet Take 5,000 mcg by mouth daily.     vitamin E 100 UNIT capsule Take 200 Units by mouth daily.     No current facility-administered medications on file prior to visit.   No Known Allergies Social History   Socioeconomic History   Marital status: Married    Spouse name: Not on file   Number of children: Not on file   Years of education: Not on file   Highest education level: Not on file  Occupational History   Not on file  Tobacco Use   Smoking status: Never Smoker   Smokeless tobacco: Never Used  Substance and Sexual Activity   Alcohol use: Yes    Comment: occ   Drug use: No   Sexual activity: Yes    Comment: Married to [place], retired.  Other Topics  Concern   Not on file  Social History Narrative   Not on file   Social Determinants of Health   Financial Resource Strain: Low Risk    Difficulty of Paying Living Expenses: Not very hard  Food Insecurity:    Worried About Running Out of Food in the Last Year: Not on file   Ran Out of Food in the Last Year: Not on file  Transportation Needs:    Lack of Transportation (Medical): Not on file   Lack of Transportation (Non-Medical): Not on file  Physical Activity:    Days of Exercise per Week: Not on file   Minutes of Exercise per Session: Not on file  Stress:    Feeling of Stress : Not on file  Social Connections:    Frequency of Communication with Friends and Family: Not on file   Frequency of Social Gatherings with Friends and Family: Not on file   Attends Religious Services: Not on file   Active Member of Clubs or Organizations: Not on file   Attends Archivist Meetings: Not on file   Marital Status: Not on file  Intimate Partner Violence:    Fear of Current or Ex-Partner: Not on file   Emotionally Abused: Not on file   Physically Abused: Not on file   Sexually Abused: Not on file      Review of Systems  All other systems reviewed and are negative.      Objective:   Physical Exam Vitals reviewed.  Constitutional:      General: She is not in acute distress.    Appearance: Normal appearance. She is normal weight. She is not ill-appearing or toxic-appearing.  Cardiovascular:     Rate and Rhythm: Normal rate and regular rhythm.     Heart sounds: Normal heart sounds.  Pulmonary:     Effort: Pulmonary effort is normal. No respiratory distress.     Breath sounds: Normal breath sounds. No stridor. No wheezing or rhonchi.  Abdominal:     General: Abdomen is flat. Bowel sounds are normal. There is no distension.     Palpations: Abdomen is soft. There is no mass.     Tenderness: There is no abdominal tenderness. There is no guarding or  rebound.     Hernia: No hernia is present.  Neurological:     Mental Status: She is alert.           Assessment & Plan:  Hypothyroidism, unspecified type  Osteoporosis without current pathological fracture, unspecified osteoporosis  type  I recommended the Prolia shot however the patient prefers to wait at the present time.  She received her flu shot today.  TSH is therapeutic.  We will make no changes in her dosage of Armour Thyroid and recheck the labs in 6 months.  I encouraged the patient to receive her Covid booster in November

## 2020-02-18 DIAGNOSIS — M542 Cervicalgia: Secondary | ICD-10-CM | POA: Diagnosis not present

## 2020-02-25 ENCOUNTER — Encounter: Payer: Self-pay | Admitting: *Deleted

## 2020-02-25 DIAGNOSIS — M542 Cervicalgia: Secondary | ICD-10-CM | POA: Diagnosis not present

## 2020-03-03 DIAGNOSIS — M542 Cervicalgia: Secondary | ICD-10-CM | POA: Diagnosis not present

## 2020-03-07 DIAGNOSIS — M542 Cervicalgia: Secondary | ICD-10-CM | POA: Diagnosis not present

## 2020-03-16 DIAGNOSIS — M3501 Sicca syndrome with keratoconjunctivitis: Secondary | ICD-10-CM | POA: Diagnosis not present

## 2020-03-17 DIAGNOSIS — M542 Cervicalgia: Secondary | ICD-10-CM | POA: Diagnosis not present

## 2020-03-24 DIAGNOSIS — M542 Cervicalgia: Secondary | ICD-10-CM | POA: Diagnosis not present

## 2020-03-29 ENCOUNTER — Encounter: Payer: Self-pay | Admitting: *Deleted

## 2020-04-07 DIAGNOSIS — M542 Cervicalgia: Secondary | ICD-10-CM | POA: Diagnosis not present

## 2020-04-14 DIAGNOSIS — M542 Cervicalgia: Secondary | ICD-10-CM | POA: Diagnosis not present

## 2020-04-25 DIAGNOSIS — M542 Cervicalgia: Secondary | ICD-10-CM | POA: Diagnosis not present

## 2020-05-11 DIAGNOSIS — D225 Melanocytic nevi of trunk: Secondary | ICD-10-CM | POA: Diagnosis not present

## 2020-05-11 DIAGNOSIS — L570 Actinic keratosis: Secondary | ICD-10-CM | POA: Diagnosis not present

## 2020-05-11 DIAGNOSIS — L821 Other seborrheic keratosis: Secondary | ICD-10-CM | POA: Diagnosis not present

## 2020-05-11 DIAGNOSIS — D2272 Melanocytic nevi of left lower limb, including hip: Secondary | ICD-10-CM | POA: Diagnosis not present

## 2020-05-11 DIAGNOSIS — D2261 Melanocytic nevi of right upper limb, including shoulder: Secondary | ICD-10-CM | POA: Diagnosis not present

## 2020-05-11 DIAGNOSIS — X32XXXA Exposure to sunlight, initial encounter: Secondary | ICD-10-CM | POA: Diagnosis not present

## 2020-05-11 DIAGNOSIS — D2262 Melanocytic nevi of left upper limb, including shoulder: Secondary | ICD-10-CM | POA: Diagnosis not present

## 2020-05-11 DIAGNOSIS — D2271 Melanocytic nevi of right lower limb, including hip: Secondary | ICD-10-CM | POA: Diagnosis not present

## 2020-05-31 ENCOUNTER — Other Ambulatory Visit: Payer: Self-pay | Admitting: Family Medicine

## 2020-06-08 DIAGNOSIS — M3501 Sicca syndrome with keratoconjunctivitis: Secondary | ICD-10-CM | POA: Diagnosis not present

## 2020-06-14 ENCOUNTER — Telehealth: Payer: Self-pay | Admitting: Pharmacist

## 2020-06-14 NOTE — Progress Notes (Addendum)
Chronic Care Management Pharmacy Assistant   Name: Joanne Gomez  MRN: [id number] DOB: 05/16/1931  Reason for Encounter: Disease State HTN.  Patient Questions:  1.  Have you seen any other providers since your last visit? Yes.   2.  Any changes in your medicines or health? No.   PCP : Susy Frizzle, MD   Their chronic conditions include: HTN, GERD, Hypothyroidism, Osteoporosis.   Office Visits:. 02/16/20 Dr. Dennard Schaumann Flu shot given. No medication changes.    Consults: 04/25/20 Physical Therapy Rosaura Carpenter. No information was given. 04/14/20 Physical Therapy Rosaura Carpenter. No information was given. 04/07/20 Physical Therapy Rosaura Carpenter. No information was given. 03/24/20 Physical Therapy Rosaura Carpenter. No information was given. 03/17/20 Physical Therapy Rosaura Carpenter. No information was given 03/16/20 Ophthalmology Porfilio, Gwyndolyn Saxon. No information was given. 03/07/20 Physical Therapy Rosaura Carpenter. No information was given.  03/03/20 Physical Therapy Rosaura Carpenter. No information was given.  02/25/20 Physical Therapy Rosaura Carpenter. No information was given.  02/18/20 Physical Therapy Rosaura Carpenter. No information was given.  02/15/20 Physical Therapy Rosaura Carpenter. No information was given.   Allergies:  No Known Allergies  Medications: Outpatient Encounter Medications as of 06/14/2020  Medication Sig   amLODipine (NORVASC) 10 MG tablet TAKE ONE TABLET EVERY DAY (DISCONTINUE LISINOPRIL/HCTZ)   ARMOUR THYROID 60 MG tablet TAKE ONE TABLET EVERY MORNING BEFORE BREAKFAST   CALCIUM CITRATE PO Take 1 tablet by mouth 2 (two) times daily. 630mg    Cholecalciferol (VITAMIN D3) 2000 units TABS Take 1 tablet by mouth daily.   esomeprazole (NEXIUM) 40 MG capsule Take 1 capsule (40 mg total) by mouth daily. (Patient not taking: Reported on 02/09/2020)   Fluocinolone Acetonide 0.01 % OIL Place 3 drops in ear(s) 2 (two) times daily as needed.   fluticasone (FLONASE) 50  MCG/ACT nasal spray TAKE 2 SPRAYS INTO BOTH NOSTRILS DAILY (Patient not taking: Reported on 02/09/2020)   Magnesium 250 MG TABS Take 1 tablet by mouth daily.   meclizine (ANTIVERT) 25 MG tablet Take 1 tablet (25 mg total) by mouth every 8 (eight) hours.   Multiple Vitamin (MULTIVITAMIN) tablet Take 1 tablet by mouth daily.     thyroid (ARMOUR THYROID) 15 MG tablet Take 1 tablet (15 mg total) by mouth daily.   vitamin B-12 (CYANOCOBALAMIN) 1000 MCG tablet Take 5,000 mcg by mouth daily.   vitamin E 100 UNIT capsule Take 200 Units by mouth daily.   No facility-administered encounter medications on file as of 06/14/2020.    Current Diagnosis: Patient Active Problem List   Diagnosis Date Noted   Chest pain at rest 03/26/2014   GERD (gastroesophageal reflux disease) 03/26/2014   Essential hypertension 03/26/2014   Osteoporosis    Hypothyroidism    H/O colon cancer, stage II 11/15/2011    Goals Addressed   None    Reviewed chart prior to disease state call. Spoke with patient regarding BP  Recent Office Vitals: BP Readings from Last 3 Encounters:  02/16/20 110/70  11/20/19 130/70  10/19/19 140/80   Pulse Readings from Last 3 Encounters:  02/16/20 84  11/20/19 94  10/19/19 95    Wt Readings from Last 3 Encounters:  02/16/20 105 lb (47.6 kg)  11/20/19 104 lb (47.2 kg)  10/19/19 106 lb (48.1 kg)     Kidney Function Lab Results  Component Value Date/Time   CREATININE 0.79 08/17/2019 08:11 AM   CREATININE 0.68 02/18/2019 09:01 AM   CREATININE 0.7 01/09/2013 08:55 AM   CREATININE 0.7  06/16/2012 09:10 AM   GFRNONAA 78 07/16/2018 08:45 AM   GFRAA 90 07/16/2018 08:45 AM    BMP Latest Ref Rng & Units 08/17/2019 02/18/2019 07/16/2018  Glucose 65 - 99 mg/dL 92 87 85  BUN 7 - 25 mg/dL 11 9 12   Creatinine 0.60 - 0.88 mg/dL 0.79 0.68 0.71  BUN/Creat Ratio 6 - 22 (calc) NOT APPLICABLE NOT APPLICABLE NOT APPLICABLE  Sodium 160 - 146 mmol/L 142 144 143  Potassium 3.5 - 5.3 mmol/L 4.1  4.5 4.5  Chloride 98 - 110 mmol/L 102 103 105  CO2 20 - 32 mmol/L 33(H) 30 29  Calcium 8.6 - 10.4 mg/dL 10.1 10.0 9.5    Current antihypertensive regimen:  Amlodipine 10 mg  How often are you checking your Blood Pressure? Patient stated she only checks her blood pressure when feeling symptomatic .  Current home BP readings: N/A  What recent interventions/DTPs have been made by any provider to improve Blood Pressure control since last CPP Visit: None  Any recent hospitalizations or ED visits since last visit with CPP? No   What diet changes have been made to improve Blood Pressure Control?  Patient stated she eats a lot of vegetables.   What exercise is being done to improve your Blood Pressure Control?  Patient stated she does min cleaning and some cooking. She stated she does not have a regular exercise routine but she does do some exercises for her inflammation in her body every other day.  Adherence Review: Is the patient currently on ACE/ARB medication? No.  Does the patient have >5 day gap between last estimated fill dates? No, CPP Please Check.  [facility] and does not have any concerns about her medication at this time.   Follow-Up:  Pharmacist Review   Charlann Lange, RMA Clinical Pharmacist Assistant [phone]   4 minutes spent in review, coordination, and documentation.  Reviewed by: Beverly Milch, PharmD Clinical Pharmacist [facility] [phone]

## 2020-06-17 ENCOUNTER — Telehealth: Payer: Self-pay | Admitting: Pharmacist

## 2020-06-17 NOTE — Progress Notes (Signed)
    Chronic Care Management Pharmacy Assistant   Name: Joanne Gomez  MRN: [id number] DOB: July 09, 1930  Reason for Encounter: Adherence Review  Patient Questions:  1.  Have you seen any other providers since your last visit? No.   2.  Any changes in your medicines or health? No.   PCP : Susy Frizzle, MD  Verified Adherence Gap Information.Per insurance data patient has not met their annual wellness screening but they did met their annual wellness visits. Their total gap measures equal to 1.   Follow-Up:  Pharmacist Review

## 2020-06-21 ENCOUNTER — Ambulatory Visit (INDEPENDENT_AMBULATORY_CARE_PROVIDER_SITE_OTHER): Payer: PPO | Admitting: *Deleted

## 2020-06-21 ENCOUNTER — Other Ambulatory Visit: Payer: Self-pay

## 2020-06-21 DIAGNOSIS — M810 Age-related osteoporosis without current pathological fracture: Secondary | ICD-10-CM | POA: Diagnosis not present

## 2020-06-21 MED ORDER — DENOSUMAB 60 MG/ML ~~LOC~~ SOSY
60.0000 mg | PREFILLED_SYRINGE | Freq: Once | SUBCUTANEOUS | Status: AC
  Administered 2020-06-21: 60 mg via SUBCUTANEOUS

## 2020-06-24 ENCOUNTER — Telehealth: Payer: Self-pay | Admitting: Pharmacist

## 2020-06-24 NOTE — Progress Notes (Addendum)
Checking the patients cart foy any medication changes since their last CCM visit. The patient received her Prolia 60 mg injection on 06-21-2020 in the PCP office.  Fanny Skates, [place] Pharmacist Assistant [phone]  2 minutes spent in review, coordination, and documentation.  Reviewed by: Beverly Milch, PharmD Clinical Pharmacist [facility] [phone]

## 2020-07-26 ENCOUNTER — Other Ambulatory Visit: Payer: Self-pay | Admitting: Family Medicine

## 2020-07-26 DIAGNOSIS — Z1231 Encounter for screening mammogram for malignant neoplasm of breast: Secondary | ICD-10-CM

## 2020-08-03 ENCOUNTER — Other Ambulatory Visit: Payer: Self-pay | Admitting: Family Medicine

## 2020-08-04 NOTE — Progress Notes (Signed)
Chronic Care Management Pharmacy Note  08/09/2020 Name:  Joanne Gomez MRN:  [id number] DOB:  [birth year]  Subjective: Joanne Gomez is an 87 y.o. year old female who is a primary patient of Pickard, Cammie Mcgee, MD.  The CCM team was consulted for assistance with disease management and care coordination needs.    Engaged with patient by telephone for follow up visit in response to provider referral for pharmacy case management and/or care coordination services.   Consent to Services:  The patient was given the following information about Chronic Care Management services today, agreed to services, and gave verbal consent: 1. CCM service includes personalized support from designated clinical staff supervised by the primary care provider, including individualized plan of care and coordination with other care providers 2. 24/7 contact phone numbers for assistance for urgent and routine care needs. 3. Service will only be billed when office clinical staff spend 20 minutes or more in a month to coordinate care. 4. Only one practitioner may furnish and bill the service in a calendar month. 5.The patient may stop CCM services at any time (effective at the end of the month) by phone call to the office staff. 6. The patient will be responsible for cost sharing (co-pay) of up to 20% of the service fee (after annual deductible is met). Patient agreed to services and consent obtained.  Patient Care Team: Susy Frizzle, MD as PCP - General (Family Medicine) Edythe Clarity, [facility] as Pharmacist (Pharmacist)  Recent office visits: 02/16/2020 - Patient hesitant on Prolia shot, no medication changes made  Recent consult visits: None since last CCM visit  Hospital visits: None in previous 6 months  Objective:  Lab Results  Component Value Date   CREATININE 0.79 08/17/2019   BUN 11 08/17/2019   GFRNONAA 78 07/16/2018   GFRAA 90 07/16/2018   NA 142 08/17/2019   K 4.1 08/17/2019   CALCIUM 10.1  08/17/2019   CO2 33 (H) 08/17/2019    No results found for: HGBA1C, FRUCTOSAMINE, GFR, MICROALBUR  Last diabetic Eye exam: No results found for: HMDIABEYEEXA  Last diabetic Foot exam: No results found for: HMDIABFOOTEX   Lab Results  Component Value Date   CHOL 196 08/17/2019   HDL 67 08/17/2019   LDLCALC 113 (H) 08/17/2019   TRIG 74 08/17/2019   CHOLHDL 2.9 08/17/2019    Hepatic Function Latest Ref Rng & Units 08/17/2019 02/18/2019 07/16/2018  Total Protein 6.1 - 8.1 g/dL 7.5 7.0 7.4  Albumin 3.6 - 5.1 g/dL - - -  AST 10 - 35 U/L 20 17 18   ALT 6 - 29 U/L 13 11 11   Alk Phosphatase 33 - 130 U/L - - -  Total Bilirubin 0.2 - 1.2 mg/dL 0.5 0.5 0.5  Bilirubin, Direct 0.0 - 0.3 mg/dL - - -    Lab Results  Component Value Date/Time   TSH 3.39 02/02/2020 08:44 AM   TSH 0.24 (L) 11/17/2019 10:06 AM   FREET4 0.87 10/01/2012 11:50 AM    CBC Latest Ref Rng & Units 08/17/2019 02/18/2019 07/16/2018  WBC 3.8 - 10.8 Thousand/uL 6.4 7.7 6.7  Hemoglobin 11.7 - 15.5 g/dL 12.8 13.1 13.1  Hematocrit 35.0 - 45.0 % 39.8 40.8 39.2  Platelets 140 - 400 Thousand/uL 230 253 250    Lab Results  Component Value Date/Time   VD25OH 36 01/08/2017 08:24 AM   VD25OH 44 07/05/2016 09:05 AM    Clinical ASCVD: No  The ASCVD Risk score (Goff DC Jr.,  et al., 2013) failed to calculate for the following reasons:   The 2013 ASCVD risk score is only valid for ages 63 to 7    Depression screen PHQ 2/9 08/21/2019 07/15/2018 09/03/2017  Decreased Interest 0 0 0  Down, Depressed, Hopeless 0 0 0  PHQ - 2 Score 0 0 0  Altered sleeping - - -  Tired, decreased energy - - -  Change in appetite - - -  Feeling bad or failure about yourself  - - -  Trouble concentrating - - -  Moving slowly or fidgety/restless - - -  Suicidal thoughts - - -  PHQ-9 Score - - -  Difficult doing work/chores - - -     Social History   Tobacco Use  Smoking Status Never Smoker  Smokeless Tobacco Never Used   BP Readings from Last  3 Encounters:  02/16/20 110/70  11/20/19 130/70  10/19/19 140/80   Pulse Readings from Last 3 Encounters:  02/16/20 84  11/20/19 94  10/19/19 95   Wt Readings from Last 3 Encounters:  02/16/20 105 lb (47.6 kg)  11/20/19 104 lb (47.2 kg)  10/19/19 106 lb (48.1 kg)    Assessment/Interventions: Review of patient past medical history, allergies, medications, health status, including review of consultants reports, laboratory and other test data, was performed as part of comprehensive evaluation and provision of chronic care management services.   SDOH:  (Social Determinants of Health) assessments and interventions performed: No   CCM Care Plan  No Known Allergies  Medications Reviewed Today    Reviewed by Edythe Clarity, [facility] (Pharmacist) on 08/09/20 at [place] List Status: <None>  Medication Order Taking? Sig Documenting Provider Last Dose Status Informant  amLODipine (NORVASC) 10 MG tablet [id number] Yes TAKE ONE TABLET EVERY DAY (DISCONTINUE LISINOPRIL/HCTZ) Susy Frizzle, MD Taking Active   ARMOUR THYROID 60 MG tablet [id number] Yes TAKE ONE TABLET EVERY MORNING BEFORE BREAKFAST Susy Frizzle, MD Taking Active   CALCIUM CITRATE PO [id number] Yes Take 1 tablet by mouth 2 (two) times daily. 678m [provider] Taking Active   Cholecalciferol (VITAMIN D3) 2000 units TABS [id number] Take 1 tablet by mouth daily. [provider] Taking Active   esomeprazole (NEXIUM) 40 MG capsule [id number] Take 1 capsule (40 mg total) by mouth daily. PSusy Frizzle MD Taking Active   Fluocinolone Acetonide 0.01 % OIL [id number] Place 3 drops in ear(s) 2 (two) times daily as needed. PSusy Frizzle MD Taking Active   Magnesium 250 MG TABS [id number] Take 1 tablet by mouth daily. [provider] Taking Active   Multiple Vitamin (MULTIVITAMIN) tablet [id number] Take 1 tablet by mouth daily. [provider] Taking Active   vitamin B-12  (CYANOCOBALAMIN) 1000 MCG tablet [id number] Take 5,000 mcg by mouth daily. [provider] Taking Active   vitamin E 100 UNIT capsule [id number] Take 200 Units by mouth daily. [provider] Taking Active           Patient Active Problem List   Diagnosis Date Noted  . Chest pain at rest 03/26/2014  . GERD (gastroesophageal reflux disease) 03/26/2014  . Essential hypertension 03/26/2014  . Osteoporosis   . Hypothyroidism   . H/O colon cancer, stage II 11/15/2011    Immunization History  Administered Date(s) Administered  . Fluad Quad(high Dose 65+) 02/17/2019, 02/16/2020  . Influenza, High Dose Seasonal PF 03/01/2017, 03/06/2018  . Influenza,inj,Quad PF,6+ Mos 03/10/2013, 03/08/2014, 03/17/2015, 03/13/2016  .  PFIZER(Purple Top)SARS-COV-2 Vaccination 06/15/2019, 07/06/2019  . Pneumococcal Conjugate-13 05/11/2013  . Pneumococcal Polysaccharide-23 05/31/2014  . Td 02/25/1996  . Tdap 05/03/2011  . Zoster 02/23/2011    Conditions to be addressed/monitored:  HTN, GERD, Hypothyroidism, Osteoporosis.   Care Plan : General Pharmacy (Adult)  Updates made by Edythe Clarity, RPH since 08/09/2020 12:00 AM    Problem: Hypothyroidism, HTN, Osteoporosis   Priority: High  Onset Date: 08/09/2020    Goal: Patient-Specific Goal   Note:   Current Barriers:  . Unable to independently afford treatment regimen   Pharmacist Clinical Goal(s):  Marland Kitchen Over the next 180 days, patient will verbalize ability to afford treatment regimen . maintain control of blood pressure as evidenced by home monitoring  . contact provider office for questions/concerns as evidenced notation of same in electronic health record through collaboration with PharmD and provider.   Interventions: . 1:1 collaboration with Susy Frizzle, MD regarding development and update of comprehensive plan of care as evidenced by provider attestation and co-signature . Inter-disciplinary care team  collaboration (see longitudinal plan of care) . Comprehensive medication review performed; medication list updated in electronic medical record  Hypertension (BP goal <140/90) -Controlled -Current treatment: . Amlodipine 6m daily -Medications previously tried: none noted  -Current home readings: not checking at home regularly, reports usually "normal"  -Denies hypotensive/hypertensive symptoms -Educated on Exercise goal of 150 minutes per week; Importance of home blood pressure monitoring; Symptoms of hypotension and importance of maintaining adequate hydration; -Counseled to monitor BP at home periodically, document, and provide log at future appointments -She denies swelling -Recommended to continue current medication  Osteoporosis (Goal: Prevent fractures) -Controlled   Last DEXA Scan: 09/01/2019             T-Score femoral neck: -2.6               T-Score lumbar spine: -3.6 -Patient is a candidate for pharmacologic treatment due to T-Score < -2.5 in femoral neck -Current treatment   Calcium citrate  Vitamin D3 2000 IU  Prolia 675mq 6 months -Medications previously tried: Fosamax  -Recommend weight-bearing and muscle strengthening exercises for building and maintaining bone density. Patient is back taking her Prolia injections.  Reports her copay was high last office visit.  She called insurance and it will be ~$100 cheaper if she has Rx filled at pharmacy and brought in to MD office. -Recommended to continue current medication Assessed patient finances. Will also have CCM team reach out about patient assistance through [employer] Will mail her application to see if it is something she wants to proceed with.  Hypothyroidism (Goal: Maintain TSH) -Controlled -Current treatment  . Armour Thyroid 6059mMedications previously tried: none noted -She takes appropriately -Most recent TSH WNL -Recommended to continue current medication   Patient  Goals/Self-Care Activities . Over the next 180 days, patient will:  - take medications as prescribed check blood pressure a few times per week', document, and provide at future appointments collaborate with provider on medication access solutions target a minimum of 150 minutes of moderate intensity exercise weekly  Follow Up Plan: The care management team will reach out to the patient again over the next 180 days.         Medication Assistance: Application for Prolia  medication assistance program. in process.  Anticipated assistance start date unknown.  See plan of care for additional detail.  [facility] [place]  [place] [address] Phone: [phone] Fax: [phone]  Uses pill box? Yes Pt endorses 100% compliance  We discussed: Benefits of medication synchronization, packaging and delivery as well as enhanced pharmacist oversight with [employer]. Patient decided to: Continue current medication management strategy  Care Plan and Follow Up Patient Decision:  Patient agrees to Care Plan and Follow-up.  Plan: The care management team will reach out to the patient again over the next 180 days.  Beverly Milch, PharmD Clinical Pharmacist [place] [phone]

## 2020-08-09 ENCOUNTER — Ambulatory Visit (INDEPENDENT_AMBULATORY_CARE_PROVIDER_SITE_OTHER): Payer: PPO | Admitting: Pharmacist

## 2020-08-09 DIAGNOSIS — I10 Essential (primary) hypertension: Secondary | ICD-10-CM

## 2020-08-09 DIAGNOSIS — E039 Hypothyroidism, unspecified: Secondary | ICD-10-CM

## 2020-08-09 DIAGNOSIS — M810 Age-related osteoporosis without current pathological fracture: Secondary | ICD-10-CM

## 2020-08-09 NOTE — Patient Instructions (Addendum)
Visit Information  Goals Addressed            This Visit's Progress   . Prevent Falls and Broken Bones-Osteoporosis       Timeframe:  Long-Range Goal Priority:  High Start Date:     08/09/20                        Expected End Date:  02/09/21                     Follow Up Date 11/24/20   - always use handrails on the stairs - get at least 10 minutes of activity every day - make an emergency alert plan in case I fall - pick up clutter from the floors - remove, or use a non-slip pad, with my throw rugs    Why is this important?    When you fall, there are 3 things that control if a bone breaks or not.   These are the fall itself, how hard and the direction that you fall and how fragile your bones are.   Preventing falls is very important for you because of fragile bones.     Notes:       Patient Care Plan: General Pharmacy (Adult)    Problem Identified: Hypothyroidism, HTN, Osteoporosis   Priority: High  Onset Date: 08/09/2020    Goal: Patient-Specific Goal   Note:   Current Barriers:  . Unable to independently afford treatment regimen   Pharmacist Clinical Goal(s):  Marland Kitchen Over the next 180 days, patient will verbalize ability to afford treatment regimen . maintain control of blood pressure as evidenced by home monitoring  . contact provider office for questions/concerns as evidenced notation of same in electronic health record through collaboration with PharmD and provider.   Interventions: . 1:1 collaboration with Susy Frizzle, MD regarding development and update of comprehensive plan of care as evidenced by provider attestation and co-signature . Inter-disciplinary care team collaboration (see longitudinal plan of care) . Comprehensive medication review performed; medication list updated in electronic medical record  Hypertension (BP goal <140/90) -Controlled -Current treatment: . Amlodipine 96m daily -Medications previously tried: none noted  -Current  home readings: not checking at home regularly, reports usually "normal"  -Denies hypotensive/hypertensive symptoms -Educated on Exercise goal of 150 minutes per week; Importance of home blood pressure monitoring; Symptoms of hypotension and importance of maintaining adequate hydration; -Counseled to monitor BP at home periodically, document, and provide log at future appointments -She denies swelling -Recommended to continue current medication  Osteoporosis (Goal: Prevent fractures) -Controlled   Last DEXA Scan: 09/01/2019             T-Score femoral neck: -2.6               T-Score lumbar spine: -3.6 -Patient is a candidate for pharmacologic treatment due to T-Score < -2.5 in femoral neck -Current treatment   Calcium citrate  Vitamin D3 2000 IU  Prolia 688mq 6 months -Medications previously tried: Fosamax  -Recommend weight-bearing and muscle strengthening exercises for building and maintaining bone density. Patient is back taking her Prolia injections.  Reports her copay was high last office visit.  She called insurance and it will be ~$100 cheaper if she has Rx filled at pharmacy and brought in to MD office. -Recommended to continue current medication Assessed patient finances. Will also have CCM team reach out about patient assistance through [employer] Will  mail her application to see if it is something she wants to proceed with.  Hypothyroidism (Goal: Maintain TSH) -Controlled -Current treatment  . Armour Thyroid 6m -Medications previously tried: none noted -She takes appropriately -Most recent TSH WNL -Recommended to continue current medication   Patient Goals/Self-Care Activities . Over the next 180 days, patient will:  - take medications as prescribed check blood pressure a few times per week', document, and provide at future appointments collaborate with provider on medication access solutions target a minimum of 150 minutes of moderate  intensity exercise weekly  Follow Up Plan: The care management team will reach out to the patient again over the next 180 days.         The patient verbalized understanding of instructions, educational materials, and care plan provided today and agreed to receive a mailed copy of patient instructions, educational materials, and care plan.  Telephone follow up appointment with pharmacy team member scheduled for: 6 months  CEdythe Clarity RMid - [facility] Osteoporosis  Osteoporosis is when the bones get thin and weak. This can cause your bones to break (fracture) more easily. What are the causes? The exact cause of this condition is not known. What increases the risk?  Having family members with this condition.  Not eating enough healthy foods.  Taking certain medicines.  Being female.  Being age 1343or older.  Smoking or using other products that contain nicotine or tobacco, such as e-cigarettes or chewing tobacco.  Not exercising.  Being of European or Asian ancestry.  Having a small body frame. What are the signs or symptoms? A broken bone might be the first sign, especially if the break results from a fall or injury that usually would not cause a bone to break. Other signs and symptoms include:  Pain in the neck or low back.  Being hunched over (stooped posture).  Getting shorter. How is this treated?  Eating more foods with more calcium and vitamin D in them.  Doing exercises.  Stopping tobacco use.  Limiting how much alcohol you drink.  Taking medicines to slow bone loss or help make the bones stronger.  Taking supplements of calcium and vitamin D every day.  Taking medicines to replace chemicals in the body (hormone replacement medicines).  Monitoring your levels of calcium and vitamin D. The goal of treatment is to strengthen your bones and lower your risk for a bone break. Follow these instructions at home: Eating and drinking Eat plenty of calcium and  vitamin D. These nutrients are good for your bones. Good sources of calcium and vitamin D include:  Some fish, such as salmon and tuna.  Foods that have calcium and vitamin D added to them (fortified foods), such as some breakfast cereals.  Egg yolks.  Cheese.  Liver.   Activity Do exercises as told by your doctor. Ask your doctor what exercises are safe for you. You should do:  Exercises that make your muscles work to hold your body weight up (weight-bearing exercises). These include tai chi, yoga, and walking.  Exercises to make your muscles stronger. One example is lifting weights. Lifestyle  Do not drink alcohol if: ? Your doctor tells you not to drink. ? You are pregnant, may be pregnant, or are planning to become pregnant.  If you drink alcohol: ? Limit how much you use to:  0-1 drink a day for women.  0-2 drinks a day for men.  Know how much alcohol is in your drink. In the [place]  one drink equals one 12 oz bottle of beer (355 mL), one 5 oz glass of wine (148 mL), or one 1 oz glass of hard liquor (44 mL).  Do not smoke or use any products that contain nicotine or tobacco. If you need help quitting, ask your doctor. Preventing falls  Use tools to help you move around (mobility aids) as needed. These include canes, walkers, scooters, and crutches.  Keep rooms well-lit.  Put away things on the floor that could make you trip. These include cords and rugs.  Install safety rails on stairs. Install grab bars in bathrooms.  Use rubber mats in slippery areas, like bathrooms.  Wear shoes that: ? Fit you well. ? Support your feet. ? Have closed toes. ? Have rubber soles or low heels.  Tell your doctor about all of the medicines you are taking. Some medicines can make you more likely to fall. General instructions  Take over-the-counter and prescription medicines only as told by your doctor.  Keep all follow-up visits. Contact a doctor if:  You have not been  tested (screened) for osteoporosis and you are: ? A woman who is age 31 or older. ? A man who is age 33 or older. Get help right away if:  You fall.  You get hurt. Summary  Osteoporosis happens when your bones get thin and weak.  Weak bones can break (fracture) more easily.  Eat plenty of calcium and vitamin D. These are good for your bones.  Tell your doctor about all of the medicines that you take. This information is not intended to replace advice given to you by your health care provider. Make sure you discuss any questions you have with your health care provider. Document Revised: 10/29/2019 Document Reviewed: 10/29/2019 Elsevier Patient Education  [place].

## 2020-08-10 ENCOUNTER — Other Ambulatory Visit: Payer: Self-pay

## 2020-08-10 ENCOUNTER — Other Ambulatory Visit: Payer: PPO

## 2020-08-10 DIAGNOSIS — I10 Essential (primary) hypertension: Secondary | ICD-10-CM

## 2020-08-10 DIAGNOSIS — E7800 Pure hypercholesterolemia, unspecified: Secondary | ICD-10-CM | POA: Diagnosis not present

## 2020-08-10 DIAGNOSIS — E039 Hypothyroidism, unspecified: Secondary | ICD-10-CM | POA: Diagnosis not present

## 2020-08-11 LAB — COMPLETE METABOLIC PANEL WITH GFR
AG Ratio: 1.4 (calc) (ref 1.0–2.5)
ALT: 16 U/L (ref 6–29)
AST: 21 U/L (ref 10–35)
Albumin: 4.4 g/dL (ref 3.6–5.1)
Alkaline phosphatase (APISO): 62 U/L (ref 37–153)
BUN: 13 mg/dL (ref 7–25)
CO2: 32 mmol/L (ref 20–32)
Calcium: 9.8 mg/dL (ref 8.6–10.4)
Chloride: 103 mmol/L (ref 98–110)
Creat: 0.7 mg/dL (ref 0.60–0.88)
GFR, Est African American: 90 mL/min/{1.73_m2} (ref >=60)
GFR, Est Non African American: 78 mL/min/{1.73_m2} (ref >=60)
Globulin: 3.1 g/dL (calc) (ref 1.9–3.7)
Glucose, Bld: 93 mg/dL (ref 65–99)
Potassium: 4.6 mmol/L (ref 3.5–5.3)
Sodium: 141 mmol/L (ref 135–146)
Total Bilirubin: 0.5 mg/dL (ref 0.2–1.2)
Total Protein: 7.5 g/dL (ref 6.1–8.1)

## 2020-08-11 LAB — CBC WITH DIFFERENTIAL/PLATELET
Absolute Monocytes: 548 cells/uL (ref 200–950)
Basophils Absolute: 51 cells/uL (ref 0–200)
Basophils Relative: 0.7 %
Eosinophils Absolute: 131 cells/uL (ref 15–500)
Eosinophils Relative: 1.8 %
HCT: 40.3 % (ref 35.0–45.0)
Hemoglobin: 13.5 g/dL (ref 11.7–15.5)
Lymphs Abs: 2270 cells/uL (ref 850–3900)
MCH: 29.9 pg (ref 27.0–33.0)
MCHC: 33.5 g/dL (ref 32.0–36.0)
MCV: 89.2 fL (ref 80.0–100.0)
MPV: 11.1 fL (ref 7.5–12.5)
Monocytes Relative: 7.5 %
Neutro Abs: 4300 cells/uL (ref 1500–7800)
Neutrophils Relative %: 58.9 %
Platelets: 252 10*3/uL (ref 140–400)
RBC: 4.52 10*6/uL (ref 3.80–5.10)
RDW: 12.8 % (ref 11.0–15.0)
Total Lymphocyte: 31.1 %
WBC: 7.3 10*3/uL (ref 3.8–10.8)

## 2020-08-11 LAB — LIPID PANEL
Cholesterol: 190 mg/dL (ref <200)
HDL: 82 mg/dL (ref >=50)
LDL Cholesterol (Calc): 91 mg/dL (calc)
Non-HDL Cholesterol (Calc): 108 mg/dL (calc) (ref <130)
Total CHOL/HDL Ratio: 2.3 (calc) (ref <5.0)
Triglycerides: 79 mg/dL (ref <150)

## 2020-08-11 LAB — TSH: TSH: 5.77 mIU/L — ABNORMAL HIGH (ref 0.40–4.50)

## 2020-08-16 ENCOUNTER — Other Ambulatory Visit: Payer: Self-pay

## 2020-08-16 ENCOUNTER — Ambulatory Visit (INDEPENDENT_AMBULATORY_CARE_PROVIDER_SITE_OTHER): Payer: PPO | Admitting: Family Medicine

## 2020-08-16 ENCOUNTER — Encounter: Payer: Self-pay | Admitting: Family Medicine

## 2020-08-16 VITALS — BP 128/66 | HR 80 | Temp 98.3°F | Resp 16 | Ht 62.0 in | Wt 107.0 lb

## 2020-08-16 DIAGNOSIS — R55 Syncope and collapse: Secondary | ICD-10-CM | POA: Diagnosis not present

## 2020-08-16 DIAGNOSIS — E039 Hypothyroidism, unspecified: Secondary | ICD-10-CM | POA: Diagnosis not present

## 2020-08-16 DIAGNOSIS — I10 Essential (primary) hypertension: Secondary | ICD-10-CM | POA: Diagnosis not present

## 2020-08-16 DIAGNOSIS — E7800 Pure hypercholesterolemia, unspecified: Secondary | ICD-10-CM

## 2020-08-16 NOTE — Progress Notes (Signed)
Subjective:    Patient ID: Joanne Gomez, female    DOB: Jul 12, 1930, 87 y.o.   MRN: [id number]  Lab on 08/10/2020  Component Date Value Ref Range Status  . WBC 08/10/2020 7.3  3.8 - 10.8 Thousand/uL Final  . RBC 08/10/2020 4.52  3.80 - 5.10 Million/uL Final  . Hemoglobin 08/10/2020 13.5  11.7 - 15.5 g/dL Final  . HCT 08/10/2020 40.3  35.0 - 45.0 % Final  . MCV 08/10/2020 89.2  80.0 - 100.0 fL Final  . MCH 08/10/2020 29.9  27.0 - 33.0 pg Final  . MCHC 08/10/2020 33.5  32.0 - 36.0 g/dL Final  . RDW 08/10/2020 12.8  11.0 - 15.0 % Final  . Platelets 08/10/2020 252  140 - 400 Thousand/uL Final  . MPV 08/10/2020 11.1  7.5 - 12.5 fL Final  . Neutro Abs 08/10/2020 4,300  1,500 - 7,800 cells/uL Final  . Lymphs Abs 08/10/2020 2,270  850 - 3,900 cells/uL Final  . Absolute Monocytes 08/10/2020 548  200 - 950 cells/uL Final  . Eosinophils Absolute 08/10/2020 131  15 - 500 cells/uL Final  . Basophils Absolute 08/10/2020 51  0 - 200 cells/uL Final  . Neutrophils Relative % 08/10/2020 58.9  % Final  . Total Lymphocyte 08/10/2020 31.1  % Final  . Monocytes Relative 08/10/2020 7.5  % Final  . Eosinophils Relative 08/10/2020 1.8  % Final  . Basophils Relative 08/10/2020 0.7  % Final  . Glucose, Bld 08/10/2020 93  65 - 99 mg/dL Final   Comment: .            Fasting reference interval .   . BUN 08/10/2020 13  7 - 25 mg/dL Final  . Creat 08/10/2020 0.70  0.60 - 0.88 mg/dL Final   Comment: For patients >89 years of age, the reference limit for Creatinine is approximately 13% higher for people identified as African-American. .   . GFR, Est Non African American 08/10/2020 78  > OR = 60 mL/min/1.81m2 Final  . GFR, Est African American 08/10/2020 90  > OR = 60 mL/min/1.37m2 Final  . BUN/Creatinine Ratio 16/11/3708 NOT APPLICABLE  6 - 22 (calc) Final  . Sodium 08/10/2020 141  135 - 146 mmol/L Final  . Potassium 08/10/2020 4.6  3.5 - 5.3 mmol/L Final  . Chloride 08/10/2020 103  98 - 110 mmol/L  Final  . CO2 08/10/2020 32  20 - 32 mmol/L Final  . Calcium 08/10/2020 9.8  8.6 - 10.4 mg/dL Final  . Total Protein 08/10/2020 7.5  6.1 - 8.1 g/dL Final  . Albumin 08/10/2020 4.4  3.6 - 5.1 g/dL Final  . Globulin 08/10/2020 3.1  1.9 - 3.7 g/dL (calc) Final  . AG Ratio 08/10/2020 1.4  1.0 - 2.5 (calc) Final  . Total Bilirubin 08/10/2020 0.5  0.2 - 1.2 mg/dL Final  . Alkaline phosphatase (APISO) 08/10/2020 62  37 - 153 U/L Final  . AST 08/10/2020 21  10 - 35 U/L Final  . ALT 08/10/2020 16  6 - 29 U/L Final  . Cholesterol 08/10/2020 190  <200 mg/dL Final  . HDL 08/10/2020 82  > OR = 50 mg/dL Final  . Triglycerides 08/10/2020 79  <150 mg/dL Final  . LDL Cholesterol (Calc) 08/10/2020 91  mg/dL (calc) Final   Comment: Reference range: <100 . Desirable range <100 mg/dL for primary prevention;   <70 mg/dL for patients with CHD or diabetic patients  with > or = 2 CHD risk factors. Marland Kitchen LDL-C is  now calculated using the Martin-Hopkins  calculation, which is a validated novel method providing  better accuracy than the Friedewald equation in the  estimation of LDL-C.  Cresenciano Genre et al. Annamaria Helling. 7124;580(99): 2061-2068  (http://education.[facility].com/faq/FAQ164)   . Total CHOL/HDL Ratio 08/10/2020 2.3  <5.0 (calc) Final  . Non-HDL Cholesterol (Calc) 08/10/2020 108  <130 mg/dL (calc) Final   Comment: For patients with diabetes plus 1 major ASCVD risk  factor, treating to a non-HDL-C goal of <100 mg/dL  (LDL-C of <70 mg/dL) is considered a therapeutic  option.   . TSH 08/10/2020 5.77* 0.40 - 4.50 mIU/L Final   Patient reports weakness.  She states that she will suddenly have spells where she will feel weak like she needs to sit down.  Is not dizziness.  Is not vertigo.  She states that she feels like she could pass out.  Symptoms will last a few minutes and then spontaneously improve.  She denies any syncope.  She denies any chest pain.  She denies any orthopnea.  Lab work above is outstanding  aside from a mildly elevated TSH.  She denies any angina.  Overall she has been doing well.  She denies any palpitations or irregular heartbeats.  Today she is in normal sinus rhythm.  Her lungs are completely clear.  She had an echocardiogram in 2021 that was relatively normal: IMPRESSIONS    1. Left ventricular ejection fraction, by estimation, is 55 to 60%. The  left ventricle has normal function. The left ventricle has no regional  wall motion abnormalities. There is mild left ventricular hypertrophy.  Left ventricular diastolic parameters  were normal.  2. Right ventricular systolic function is normal. The right ventricular  size is normal. There is normal pulmonary artery systolic pressure.  3. The mitral valve is normal in structure. Trivial mitral valve  regurgitation. No evidence of mitral stenosis.  4. The aortic valve is normal in structure. Aortic valve regurgitation is  not visualized. Mild aortic valve sclerosis is present, with no evidence  of aortic valve stenosis.  5. The inferior vena cava is normal in size with greater than 50%  respiratory variability, suggesting right atrial pressure of 3 mmHg.  Past Medical History:  Diagnosis Date  . Cancer ([place]) 11/2007   colon stage II  . Diverticula of colon   . GERD (gastroesophageal reflux disease)   . H/O colon cancer, stage II 11/15/2011  . HH (hiatus hernia)   . History of hiatal hernia   . HOH (hard of hearing)    MILD LOSS  . Hyperlipidemia   . Hypothyroidism   . Osteoporosis   . Pneumonia    Past Surgical History:  Procedure Laterality Date  . ABDOMINAL HYSTERECTOMY    . APPENDECTOMY    . CATARACT EXTRACTION W/PHACO Right 08/30/2015   Procedure: CATARACT EXTRACTION PHACO AND INTRAOCULAR LENS PLACEMENT (IOC);  Surgeon: Birder Robson, MD;  Location: [facility] ORS;  Service: Ophthalmology;  Laterality: Right;   [place]    00:40.8 AP%  15.7 CDE    6.42   fluid pack lot #[id number]  exp 02/24/2017  . CATARACT EXTRACTION  W/PHACO Left 09/20/2015   Procedure: CATARACT EXTRACTION PHACO AND INTRAOCULAR LENS PLACEMENT (IOC);  Surgeon: Birder Robson, MD;  Location: [facility] ORS;  Service: Ophthalmology;  Laterality: Left;  [place] 44.8 AP% 19.6 CDE 8.77 FLUID PACK LOT # [id number]  . CHOLECYSTECTOMY    . ELBOW SURGERY     right   . hemocolectomy  2009  . VAGINAL PROLAPSE  REPAIR     Current Outpatient Medications on File Prior to Visit  Medication Sig Dispense Refill  . amLODipine (NORVASC) 10 MG tablet TAKE ONE TABLET EVERY DAY (DISCONTINUE LISINOPRIL/HCTZ) 90 tablet 3  . ARMOUR THYROID 60 MG tablet TAKE ONE TABLET EVERY MORNING BEFORE BREAKFAST 90 tablet 1  . CALCIUM CITRATE PO Take 1 tablet by mouth 2 (two) times daily. 630mg     . Cholecalciferol (VITAMIN D3) 2000 units TABS Take 1 tablet by mouth daily.    Marland Kitchen esomeprazole (NEXIUM) 40 MG capsule Take 1 capsule (40 mg total) by mouth daily. 90 capsule 3  . Fluocinolone Acetonide 0.01 % OIL Place 3 drops in ear(s) 2 (two) times daily as needed. 20 mL 0  . Magnesium 250 MG TABS Take 1 tablet by mouth daily.    . Multiple Vitamin (MULTIVITAMIN) tablet Take 1 tablet by mouth daily.    . vitamin B-12 (CYANOCOBALAMIN) 1000 MCG tablet Take 5,000 mcg by mouth daily.    . vitamin E 100 UNIT capsule Take 200 Units by mouth daily.     No current facility-administered medications on file prior to visit.   No Known Allergies Social History   Socioeconomic History  . Marital status: Married    Spouse name: Not on file  . Number of children: Not on file  . Years of education: Not on file  . Highest education level: Not on file  Occupational History  . Not on file  Tobacco Use  . Smoking status: Never Smoker  . Smokeless tobacco: Never Used  Substance and Sexual Activity  . Alcohol use: Yes    Comment: occ  . Drug use: No  . Sexual activity: Yes    Comment: Married to [place], retired.  Other Topics Concern  . Not on file  Social History Narrative  . Not on file    Social Determinants of Health   Financial Resource Strain: Low Risk   . Difficulty of Paying Living Expenses: Not very hard  Food Insecurity: Not on file  Transportation Needs: Not on file  Physical Activity: Not on file  Stress: Not on file  Social Connections: Not on file  Intimate Partner Violence: Not on file      Review of Systems  All other systems reviewed and are negative.      Objective:   Physical Exam Vitals reviewed.  Constitutional:      General: She is not in acute distress.    Appearance: Normal appearance. She is normal weight. She is not ill-appearing or toxic-appearing.  Cardiovascular:     Rate and Rhythm: Normal rate and regular rhythm.     Heart sounds: Normal heart sounds.  Pulmonary:     Effort: Pulmonary effort is normal. No respiratory distress.     Breath sounds: Normal breath sounds. No stridor. No wheezing or rhonchi.  Abdominal:     General: Abdomen is flat. Bowel sounds are normal. There is no distension.     Palpations: Abdomen is soft. There is no mass.     Tenderness: There is no abdominal tenderness. There is no guarding or rebound.     Hernia: No hernia is present.  Neurological:     Mental Status: She is alert.           Assessment & Plan:  Hypothyroidism, unspecified type  Essential hypertension  Pure hypercholesterolemia  Near syncope  TSH is mildly elevated however the patient would like to recheck this in 3 months prior to making any  changes in her Armour Thyroid.  Her blood pressure today is outstanding and her lab work is excellent.  Her cholesterol is perfect.  However I am concerned about these "weakness spells".  I am concerned that the patient may be having cardiac arrhythmias causing near syncope.  My biggest concern will be some type of bradycardia due to sick sinus syndrome.  Today she is in normal sinus rhythm with no evidence of any malfunction.  I would like her to see cardiology to have a monitor placed to  rule out any significant underlying cardiac arrhythmia.

## 2020-09-07 DIAGNOSIS — M3501 Sicca syndrome with keratoconjunctivitis: Secondary | ICD-10-CM | POA: Diagnosis not present

## 2020-09-14 ENCOUNTER — Other Ambulatory Visit: Payer: Self-pay

## 2020-09-14 ENCOUNTER — Telehealth (INDEPENDENT_AMBULATORY_CARE_PROVIDER_SITE_OTHER): Payer: PPO | Admitting: Family Medicine

## 2020-09-14 DIAGNOSIS — R197 Diarrhea, unspecified: Secondary | ICD-10-CM | POA: Diagnosis not present

## 2020-09-14 MED ORDER — DIPHENOXYLATE-ATROPINE 2.5-0.025 MG PO TABS: 1.0000 | ORAL_TABLET | Freq: Four times a day (QID) | ORAL | 0 refills | Status: DC | PRN

## 2020-09-14 NOTE — Progress Notes (Signed)
Subjective:    Patient ID: [name], female    DOB: [birth year], 87 y.o.   MRN: [id number]  HPI Patient is being seen today as a telephone visit.  She consents to be seen via telephone.  Phone call began at 917.  Phone call concluded at 928.  Patient is currently at home.  I am currently in my office.  She states that 2 nights ago, she developed gas pains.  She describes it as a pressure-like pain in her left upper quadrant just below her ribs.  It was a constant light pressure pain.  She felt that if she could pass gas, the pain would get better or if she could burp the pain will get better.  Then yesterday morning she developed copious diarrhea.  She states that yesterday, every time she would drink, she would have to go to the bathroom to have watery diarrhea.  She denies any blood.  She denies any melena.  However she estimates that she had 10-15 loose watery bowel movements yesterday.  She has had no further pain for the last 86 hours.  She denies any fever.  She denies any nausea or vomiting.  She feels fine other than having watery diarrhea every time she drinks and occasional fecal incontinence due to the loose nature of the stool.  She tried Pepto-Bismol yesterday with no improvement Past Medical History:  Diagnosis Date  . Cancer ([place]) 11/2007   colon stage II  . Diverticula of colon   . GERD (gastroesophageal reflux disease)   . H/O colon cancer, stage II 11/15/2011  . HH (hiatus hernia)   . History of hiatal hernia   . HOH (hard of hearing)    MILD LOSS  . Hyperlipidemia   . Hypothyroidism   . Osteoporosis   . Pneumonia    Past Surgical History:  Procedure Laterality Date  . ABDOMINAL HYSTERECTOMY    . APPENDECTOMY    . CATARACT EXTRACTION W/PHACO Right 08/30/2015   Procedure: CATARACT EXTRACTION PHACO AND INTRAOCULAR LENS PLACEMENT (IOC);  Surgeon: Birder Robson, MD;  Location: [facility] ORS;  Service: Ophthalmology;  Laterality: Right;   [place]    00:40.8 AP%  15.7 CDE     6.42   fluid pack lot #[id number] H  exp 02/24/2017  . CATARACT EXTRACTION W/PHACO Left 09/20/2015   Procedure: CATARACT EXTRACTION PHACO AND INTRAOCULAR LENS PLACEMENT (IOC);  Surgeon: Birder Robson, MD;  Location: [facility] ORS;  Service: Ophthalmology;  Laterality: Left;  [place] 44.8 AP% 19.6 CDE 8.77 FLUID PACK LOT # [id number]  . CHOLECYSTECTOMY    . ELBOW SURGERY     right   . hemocolectomy  2009  . VAGINAL PROLAPSE REPAIR     Current Outpatient Medications on File Prior to Visit  Medication Sig Dispense Refill  . amLODipine (NORVASC) 10 MG tablet TAKE ONE TABLET EVERY DAY (DISCONTINUE LISINOPRIL/HCTZ) 90 tablet 3  . ARMOUR THYROID 60 MG tablet TAKE ONE TABLET EVERY MORNING BEFORE BREAKFAST 90 tablet 1  . CALCIUM CITRATE PO Take 1 tablet by mouth 2 (two) times daily. 630mg     . Cholecalciferol (VITAMIN D3) 2000 units TABS Take 1 tablet by mouth daily.    Marland Kitchen esomeprazole (NEXIUM) 40 MG capsule Take 1 capsule (40 mg total) by mouth daily. 90 capsule 3  . Fluocinolone Acetonide 0.01 % OIL Place 3 drops in ear(s) 2 (two) times daily as needed. 20 mL 0  . Magnesium 250 MG TABS Take 1 tablet by mouth daily.    Marland Kitchen  Multiple Vitamin (MULTIVITAMIN) tablet Take 1 tablet by mouth daily.    . vitamin B-12 (CYANOCOBALAMIN) 1000 MCG tablet Take 5,000 mcg by mouth daily.    . vitamin E 100 UNIT capsule Take 200 Units by mouth daily.     No current facility-administered medications on file prior to visit.   No Known Allergies Social History   Socioeconomic History  . Marital status: Married    Spouse name: Not on file  . Number of children: Not on file  . Years of education: Not on file  . Highest education level: Not on file  Occupational History  . Not on file  Tobacco Use  . Smoking status: Never Smoker  . Smokeless tobacco: Never Used  Substance and Sexual Activity  . Alcohol use: Yes    Comment: occ  . Drug use: No  . Sexual activity: Yes    Comment: Married to [place], retired.  Other Topics  Concern  . Not on file  Social History Narrative  . Not on file   Social Determinants of Health   Financial Resource Strain: Low Risk   . Difficulty of Paying Living Expenses: Not very hard  Food Insecurity: Not on file  Transportation Needs: Not on file  Physical Activity: Not on file  Stress: Not on file  Social Connections: Not on file  Intimate Partner Violence: Not on file     Review of Systems  All other systems reviewed and are negative.      Objective:   Physical Exam        Assessment & Plan:  Diarrhea, unspecified type  Given the fact that there is no additional pain, no fever, no blood, I do not feel that this is colitis or diverticulitis.  Instead I feel that the patient may have a mild viral gastroenteritis.  I recommended that she push fluids to try to stay hydrated.  We will use Imodium 1 tablet after each loose stool up to 6 tablets a day to try to slow down the diarrhea so that she does not become dehydrated.  She will call me back immediately if she experiences blood or fever or worsening abdominal pain.  If Imodium is not effective she can try Lomotil.  I will send Lomotil to her pharmacy but I was very specific that she should not combine the medications and she should try Imodium at least for 24 hours before trying the Lomotil.  She needs to be seen if getting worse

## 2020-09-19 ENCOUNTER — Telehealth: Payer: Self-pay | Admitting: Family Medicine

## 2020-09-19 NOTE — Telephone Encounter (Signed)
Copied from [employer] [id number]. Topic: Medicare AWV >> Sep 19, 2020  1:46 PM Cher Nakai R wrote: Reason for CRM:  Left message for patient to call back and schedule Medicare Annual Wellness Visit (AWV) in office.   If not able to come in office, please offer to do virtually or by telephone.   Last AWV:  07/15/2018  Please schedule at anytime with [facility]-Nurse Health Advisor.  If any questions, please contact me at [phone]

## 2020-09-20 ENCOUNTER — Other Ambulatory Visit: Payer: Self-pay

## 2020-09-20 ENCOUNTER — Encounter: Payer: Self-pay | Admitting: Cardiovascular Disease

## 2020-09-20 ENCOUNTER — Ambulatory Visit: Payer: PPO | Admitting: Cardiovascular Disease

## 2020-09-20 VITALS — BP 110/62 | HR 81 | Ht 63.0 in | Wt 104.2 lb

## 2020-09-20 DIAGNOSIS — I10 Essential (primary) hypertension: Secondary | ICD-10-CM

## 2020-09-20 DIAGNOSIS — R079 Chest pain, unspecified: Secondary | ICD-10-CM | POA: Diagnosis not present

## 2020-09-20 DIAGNOSIS — R55 Syncope and collapse: Secondary | ICD-10-CM | POA: Diagnosis not present

## 2020-09-20 MED ORDER — AMLODIPINE BESYLATE 2.5 MG PO TABS: 2.5000 mg | ORAL_TABLET | Freq: Every day | ORAL | 3 refills | Status: DC

## 2020-09-20 NOTE — Patient Instructions (Addendum)
Medication Instructions:  Please cut the amlodipine down to 2.5 mg daily in the Am with food Monitor blood pressure  If you need a refill on your cardiac medications before your next appointment, please call your pharmacy.    Lab work: No new labs needed   If you have labs (blood work) drawn today and your tests are completely normal, you will receive your results only by: Marland Kitchen MyChart Message (if you have MyChart) OR . A paper copy in the mail If you have any lab test that is abnormal or we need to change your treatment, we will call you to review the results.   Testing/Procedures: No new testing needed   Follow-Up: At [facility], you and your health needs are our priority.  As part of our continuing mission to provide you with exceptional heart care, we have created designated Provider Care Teams.  These Care Teams include your primary Cardiologist (physician) and Advanced Practice Providers (APPs -  Physician Assistants and Nurse Practitioners) who all work together to provide you with the care you need, when you need it.  . You will need a follow up appointment as needed  . Providers on your designated Care Team:   . Murray Hodgkins, NP . Christell Faith, PA-C . Marrianne Mood, PA-C  Any Other Special Instructions Will Be Listed Below (If Applicable).  COVID-19 Vaccine Information can be found at: [email or web address] For questions related to vaccine distribution or appointments, please email [email or web address] or call [phone].

## 2020-09-20 NOTE — Progress Notes (Signed)
Cardiology Office Note  Date:  09/20/2020   ID:  Joanne, Chipley January 20, 1931, MRN [id number]  PCP:  Susy Frizzle, MD   Chief Complaint  Patient presents with  . New Patient (Initial Visit)    Ref by Dr. Dennard Schaumann for near syncope. Patient c/o weakness and dizziness in the am.  Medications reviewed by the patient verbally.     HPI:  [name] is a pleasant 87 year old woman with  history of colon cancer, laparoscopic right hemicolectomy July 2009,  Previously seen in 2015 for chest discomfort  long history of GI upset.  Gas pain after eating, followed by GI Presenting by referral from Dr. Dennard Schaumann for near syncope  Recently seen by primary care September 14, 2020, having diarrhea, started on Lomotil  Notes from August 16, 2020 visit with primary care reported having spells of weakness, feeling like she needs to sit down Near syncope Last few minutes then spontaneously improve  Echocardiogram 2021, normal ejection fraction otherwise normal study  Weak spells in the Am 4-5 months  Diarrhea better,   Husband with parkinsons Lives in appt, walks in "circle" No regular exercise  Weight loss, 130 to 104 pounds, since 2015 Takes norvasc 10 mg daily in AM   lab work reviewed Labs: TSH :5.77 Total chol 190 BMP normal CBC normal  family history; father had CVA and died at 10,  mother had thyroid disease and Huntington's disease and died at 69   denies any smoking, no alcohol.   EKG shows normal sinus rhythm with rate 81 bpm with no significant ST or T-wave changes   PMH:   has a past medical history of Cancer ([place]) (11/2007), Diverticula of colon, GERD (gastroesophageal reflux disease), H/O colon cancer, stage II (11/15/2011), HH (hiatus hernia), History of hiatal hernia, HOH (hard of hearing), Hyperlipidemia, Hypothyroidism, Osteoporosis, and Pneumonia.  PSH:    Past Surgical History:  Procedure Laterality Date  . ABDOMINAL HYSTERECTOMY    . APPENDECTOMY    . CATARACT  EXTRACTION W/PHACO Right 08/30/2015   Procedure: CATARACT EXTRACTION PHACO AND INTRAOCULAR LENS PLACEMENT (IOC);  Surgeon: Birder Robson, MD;  Location: [facility] ORS;  Service: Ophthalmology;  Laterality: Right;   [place]    00:40.8 AP%  15.7 CDE    6.42   fluid pack lot #[id number] H  exp 02/24/2017  . CATARACT EXTRACTION W/PHACO Left 09/20/2015   Procedure: CATARACT EXTRACTION PHACO AND INTRAOCULAR LENS PLACEMENT (IOC);  Surgeon: Birder Robson, MD;  Location: [facility] OR[facility];  Service: Ophthalmology;  Laterality: Left;  [place] 44.8 AP% 19.6 CDE 8.77 FLUID PACK LOT # [id number]  . CHOLECYSTECTOMY    . ELBOW SURGERY     right   . hemocolectomy  2009  . VAGINAL PROLAPSE REPAIR      Current Outpatient Medications  Medication Sig Dispense Refill  . amLODipine (NORVASC) 10 MG tablet TAKE ONE TABLET EVERY DAY (DISCONTINUE LISINOPRIL/HCTZ) 90 tablet 3  . ARMOUR THYROID 60 MG tablet TAKE ONE TABLET EVERY MORNING BEFORE BREAKFAST 90 tablet 1  . CALCIUM CITRATE PO Take 1 tablet by mouth 2 (two) times daily. 630mg     . Cholecalciferol (VITAMIN D3) 2000 units TABS Take 1 tablet by mouth daily.    Marland Kitchen esomeprazole (NEXIUM) 40 MG capsule Take 40 mg by mouth daily as needed.    . Fluocinolone Acetonide 0.01 % OIL Place 3 drops in ear(s) 2 (two) times daily as needed. 20 mL 0  . Magnesium 250 MG TABS Take 1 tablet by mouth daily.    Marland Kitchen  Multiple Vitamin (MULTIVITAMIN) tablet Take 1 tablet by mouth daily.    Marland Kitchen nystatin ointment (MYCOSTATIN) Apply 1 application topically 2 (two) times daily as needed.    . vitamin B-12 (CYANOCOBALAMIN) 1000 MCG tablet Take 5,000 mcg by mouth daily.    . vitamin E 100 UNIT capsule Take 200 Units by mouth daily.     No current facility-administered medications for this visit.     Allergies:   Patient has no known allergies.   Social History:  The patient  reports that she has never smoked. She has never used smokeless tobacco. She reports current alcohol use. She reports that she does not  use drugs.   Family History:   family history includes Heart attack (age of onset: 49) in her sister; Hyperlipidemia in her sister; Hypertension in her sister.    Review of Systems: Review of Systems  Constitutional: Negative.        Weakness episodes first thing in the morning  HENT: Negative.   Respiratory: Negative.   Cardiovascular: Negative.   Gastrointestinal: Negative.   Musculoskeletal: Negative.   Neurological: Negative.   Psychiatric/Behavioral: Negative.   All other systems reviewed and are negative.    PHYSICAL EXAM: VS:  BP 110/62 (BP Location: Right Arm, Patient Position: Sitting, Cuff Size: Normal)   Pulse 81   Ht 5\' 3"  (1.6 m)   Wt 104 lb 4 oz (47.3 kg)   SpO2 98%   BMI 18.47 kg/m  , BMI Body mass index is 18.47 kg/m. GEN: Well nourished, well developed, in no acute distress HEENT: normal Neck: no JVD, carotid bruits, or masses Cardiac: RRR; no murmurs, rubs, or gallops,no edema  Respiratory:  clear to auscultation bilaterally, normal work of breathing GI: soft, nontender, nondistended, + BS MS: no deformity or atrophy Skin: warm and dry, no rash Neuro:  Strength and sensation are intact Psych: euthymic mood, full affect   Recent Labs: 08/10/2020: ALT 16; BUN 13; Creat 0.70; Hemoglobin 13.5; Platelets 252; Potassium 4.6; Sodium 141; TSH 5.77    Lipid Panel Lab Results  Component Value Date   CHOL 190 08/10/2020   HDL 82 08/10/2020   LDLCALC 91 08/10/2020   TRIG 79 08/10/2020      Wt Readings from Last 3 Encounters:  09/20/20 104 lb 4 oz (47.3 kg)  08/16/20 107 lb (48.5 kg)  02/16/20 105 lb (47.6 kg)       ASSESSMENT AND PLAN:  Problem List Items Addressed This Visit      Cardiology Problems   Near syncope   Relevant Orders   EKG 12-Lead   Essential hypertension - Primary   Relevant Orders   EKG 12-Lead     Other   Chest pain at rest      Hypertension Takes her amlodipine 10 mg first thing in the morning Mildly  orthostatic on today's visit 119/70 supine down to 106/66 standing heart rate 79 up to 92, felt lightheaded Recommend she decrease amlodipine down to 2.5 mg daily Because of drop in pressure likely from chronic weight loss over the past 3 years Recommend she try to stabilize her weight If blood pressure continues to drop on amlodipine 2.5, may need to hold the amlodipine -Normal EKG and otherwise normal clinical exam For further symptoms could consider ZIO monitor, echocardiogram -Unable to exclude low sugars, recommend she try to have some juice first thing in the morning, take her low-dose amlodipine later after breakfast  Gas pain/ abdominal discomfort Longstanding issue, reports diarrhea has  improved Prior history colon cancer, partial colectomy  Weight loss Stressed importance of not missing meals, increasing snacks during the day  Weakness Recommend she continue her walking program, leg strengthening Very sedentary, walks around the court outside her apartment, sometimes goes across the street for meals otherwise no regular exercise program   Total encounter time more than 45 minutes  Greater than 50% was spent in counseling and coordination of care with the patient    Signed, Esmond Plants, M.D., Ph.D. [place], [place]

## 2020-10-28 ENCOUNTER — Encounter: Payer: Self-pay | Admitting: Family Medicine

## 2020-10-28 ENCOUNTER — Ambulatory Visit (INDEPENDENT_AMBULATORY_CARE_PROVIDER_SITE_OTHER): Payer: PPO | Admitting: Family Medicine

## 2020-10-28 ENCOUNTER — Other Ambulatory Visit: Payer: Self-pay

## 2020-10-28 VITALS — BP 122/68 | HR 88 | Temp 98.4°F | Resp 14 | Ht 63.0 in | Wt 105.0 lb

## 2020-10-28 DIAGNOSIS — R42 Dizziness and giddiness: Secondary | ICD-10-CM

## 2020-10-28 NOTE — Progress Notes (Signed)
Subjective:    Patient ID: Joanne Gomez, female    DOB: 12/28/1930, 87 y.o.   MRN: [id number]  Patient is a very sweet 87 year old Caucasian female who presents today with dizziness and lightheadedness.  She states it primarily occurs in the morning when she wakes.  She states that she starts to feel better after she drinks orange juice.  However it certainly seems to be positional.  It seems the active getting up and getting out of bed makes her feel lightheaded.  I asked her to be more specific and to specify whether she is lightheaded like she may pass out or whether there is vertigo like the room is spinning.  She states is more of a lightheaded sensation.  To me it sounds like an orthostatic drop in her blood pressure.  She denies any chest pain or shortness of breath or palpitations or tachycardia.  She was concerned that it may be her blood sugar.  Of note, she appears to be slightly malnourished.  Her weight has stabilized but I still feel that her weight is low for her height.  BMI is borderline at 18.  I also do not feel that she is drinking enough fluid Past Medical History:  Diagnosis Date  . Cancer ([place]) 11/2007   colon stage II  . Diverticula of colon   . GERD (gastroesophageal reflux disease)   . H/O colon cancer, stage II 11/15/2011  . HH (hiatus hernia)   . History of hiatal hernia   . HOH (hard of hearing)    MILD LOSS  . Hyperlipidemia   . Hypothyroidism   . Osteoporosis   . Pneumonia    Past Surgical History:  Procedure Laterality Date  . ABDOMINAL HYSTERECTOMY    . APPENDECTOMY    . CATARACT EXTRACTION W/PHACO Right 08/30/2015   Procedure: CATARACT EXTRACTION PHACO AND INTRAOCULAR LENS PLACEMENT (IOC);  Surgeon: Birder Robson, MD;  Location: [facility];  Service: Ophthalmology;  Laterality: Right;   [place]    00:40.8 AP%  15.7 CDE    6.42   fluid pack lot #[id number] H  exp 02/24/2017  . CATARACT EXTRACTION W/PHACO Left 09/20/2015   Procedure: CATARACT EXTRACTION PHACO  AND INTRAOCULAR LENS PLACEMENT (IOC);  Surgeon: Birder Robson, MD;  Location: [facility];  Service: Ophthalmology;  Laterality: Left;  [place] 44.8 AP% 19.6 CDE 8.77 FLUID PACK LOT # [id number]  . CHOLECYSTECTOMY    . ELBOW SURGERY     right   . hemocolectomy  2009  . VAGINAL PROLAPSE REPAIR     Current Outpatient Medications on File Prior to Visit  Medication Sig Dispense Refill  . amLODipine (NORVASC) 2.5 MG tablet Take 1 tablet (2.5 mg total) by mouth daily. 90 tablet 3  . ARMOUR THYROID 60 MG tablet TAKE ONE TABLET EVERY MORNING BEFORE BREAKFAST 90 tablet 1  . CALCIUM CITRATE PO Take 1 tablet by mouth 2 (two) times daily. 630mg     . Cholecalciferol (VITAMIN D3) 2000 units TABS Take 1 tablet by mouth daily.    Marland Kitchen esomeprazole (NEXIUM) 40 MG capsule Take 40 mg by mouth daily as needed.    . Fluocinolone Acetonide 0.01 % OIL Place 3 drops in ear(s) 2 (two) times daily as needed. 20 mL 0  . Magnesium 250 MG TABS Take 1 tablet by mouth daily.    . Multiple Vitamin (MULTIVITAMIN) tablet Take 1 tablet by mouth daily.    Marland Kitchen nystatin ointment (MYCOSTATIN) Apply 1 application topically 2 (two) times daily as  needed.    . vitamin B-12 (CYANOCOBALAMIN) 1000 MCG tablet Take 5,000 mcg by mouth daily.    . vitamin E 100 UNIT capsule Take 200 Units by mouth daily.     No current facility-administered medications on file prior to visit.   No Known Allergies Social History   Socioeconomic History  . Marital status: Married    Spouse name: Not on file  . Number of children: Not on file  . Years of education: Not on file  . Highest education level: Not on file  Occupational History  . Not on file  Tobacco Use  . Smoking status: Never Smoker  . Smokeless tobacco: Never Used  Vaping Use  . Vaping Use: Never used  Substance and Sexual Activity  . Alcohol use: Yes    Comment: occ  . Drug use: No  . Sexual activity: Yes    Comment: Married to [place], retired.  Other Topics Concern  . Not on file   Social History Narrative  . Not on file   Social Determinants of Health   Financial Resource Strain: Low Risk   . Difficulty of Paying Living Expenses: Not very hard  Food Insecurity: Not on file  Transportation Needs: Not on file  Physical Activity: Not on file  Stress: Not on file  Social Connections: Not on file  Intimate Partner Violence: Not on file      Review of Systems  All other systems reviewed and are negative.      Objective:   Physical Exam Vitals reviewed.  Constitutional:      General: She is not in acute distress.    Appearance: Normal appearance. She is normal weight. She is not ill-appearing or toxic-appearing.  Cardiovascular:     Rate and Rhythm: Normal rate and regular rhythm.     Heart sounds: Normal heart sounds.  Pulmonary:     Effort: Pulmonary effort is normal. No respiratory distress.     Breath sounds: Normal breath sounds. No stridor. No wheezing or rhonchi.  Abdominal:     General: Abdomen is flat. Bowel sounds are normal. There is no distension.     Palpations: Abdomen is soft. There is no mass.     Tenderness: There is no abdominal tenderness. There is no guarding or rebound.     Hernia: No hernia is present.  Neurological:     Mental Status: She is alert.           Assessment & Plan:  Orthostatic dizziness - Plan: CBC with Differential/Platelet, COMPLETE METABOLIC PANEL WITH GFR  I believe this is a combination of too much medication, possible dehydration, and possible protein calorie malnutrition.  Therefore I have asked her to stop amlodipine.  I would like her to try to liberalize her fluid intake.  I also asked her to add any Ensure 1 can daily for protein calorie malnutrition and then reassess in 2 to 3 weeks to see if she is improving.  Meanwhile I will check a CBC and a CMP

## 2020-10-29 LAB — CBC WITH DIFFERENTIAL/PLATELET
Absolute Monocytes: 518 cells/uL (ref 200–950)
Basophils Absolute: 53 cells/uL (ref 0–200)
Basophils Relative: 0.7 %
Eosinophils Absolute: 83 cells/uL (ref 15–500)
Eosinophils Relative: 1.1 %
HCT: 39.6 % (ref 35.0–45.0)
Hemoglobin: 12.9 g/dL (ref 11.7–15.5)
Lymphs Abs: 1725 cells/uL (ref 850–3900)
MCH: 29.1 pg (ref 27.0–33.0)
MCHC: 32.6 g/dL (ref 32.0–36.0)
MCV: 89.4 fL (ref 80.0–100.0)
MPV: 11.1 fL (ref 7.5–12.5)
Monocytes Relative: 6.9 %
Neutro Abs: 5123 cells/uL (ref 1500–7800)
Neutrophils Relative %: 68.3 %
Platelets: 220 10*3/uL (ref 140–400)
RBC: 4.43 10*6/uL (ref 3.80–5.10)
RDW: 12.9 % (ref 11.0–15.0)
Total Lymphocyte: 23 %
WBC: 7.5 10*3/uL (ref 3.8–10.8)

## 2020-10-29 LAB — COMPLETE METABOLIC PANEL WITH GFR
AG Ratio: 1.4 (calc) (ref 1.0–2.5)
ALT: 16 U/L (ref 6–29)
AST: 24 U/L (ref 10–35)
Albumin: 4.3 g/dL (ref 3.6–5.1)
Alkaline phosphatase (APISO): 53 U/L (ref 37–153)
BUN: 13 mg/dL (ref 7–25)
CO2: 29 mmol/L (ref 20–32)
Calcium: 9.8 mg/dL (ref 8.6–10.4)
Chloride: 100 mmol/L (ref 98–110)
Creat: 0.69 mg/dL (ref 0.60–0.88)
GFR, Est African American: 91 mL/min/{1.73_m2} (ref >=60)
GFR, Est Non African American: 78 mL/min/{1.73_m2} (ref >=60)
Globulin: 3 g/dL (calc) (ref 1.9–3.7)
Glucose, Bld: 92 mg/dL (ref 65–99)
Potassium: 5.2 mmol/L (ref 3.5–5.3)
Sodium: 137 mmol/L (ref 135–146)
Total Bilirubin: 0.4 mg/dL (ref 0.2–1.2)
Total Protein: 7.3 g/dL (ref 6.1–8.1)

## 2020-11-01 ENCOUNTER — Encounter: Payer: Self-pay | Admitting: *Deleted

## 2020-11-01 ENCOUNTER — Telehealth: Payer: Self-pay

## 2020-11-01 ENCOUNTER — Ambulatory Visit
Admission: RE | Admit: 2020-11-01 | Discharge: 2020-11-01 | Disposition: A | Discharge status: Home / Self Care | Payer: PPO | Source: Ambulatory Visit | Attending: Family Medicine | Admitting: Family Medicine

## 2020-11-01 ENCOUNTER — Other Ambulatory Visit: Payer: Self-pay

## 2020-11-01 DIAGNOSIS — Z1231 Encounter for screening mammogram for malignant neoplasm of breast: Secondary | ICD-10-CM | POA: Diagnosis not present

## 2020-11-01 NOTE — Progress Notes (Addendum)
    Chronic Care Management Pharmacy Assistant   Name: Joanne Gomez  MRN: [id number] DOB: 1931/02/12  Reason for Encounter: General Disease State Call   Conditions to be addressed/monitored: HTN, GERD, Hypothyroidism, Osteoporosis.   Recent office visits:  10/28/20 Dr. Dennard Schaumann Orthostatic dizziness.Per note:Therefore I have asked her to stop amlodipine. I would like her to try to liberalize her fluid intake.  I also asked her to add any Ensure 1 can daily for protein calorie malnutrition and then reassess in 2 to 3 weeks to see if she is improving. Labs drawn. No medication changes.  09/14/20 Dr. Dennard Schaumann For diarrhea. STARTED Diphenoxylate-Atropine 2.5-0.025 mg 1 tablet 4 times daily PRN.  08/16/20 Dr. Dennard Schaumann For follow-up. No medication changes.   Recent consult visits:  09/20/20 Cardiology Gollan, Kathlene November, MD. For follow-up. DECREASED Amlodipine to 2.5 daily. STOPPED [place] [facility] visits:  None since 08/09/20  Medications: Outpatient Encounter Medications as of 11/01/2020  Medication Sig   amLODipine (NORVASC) 2.5 MG tablet Take 1 tablet (2.5 mg total) by mouth daily.   ARMOUR THYROID 60 MG tablet TAKE ONE TABLET EVERY MORNING BEFORE BREAKFAST   CALCIUM CITRATE PO Take 1 tablet by mouth 2 (two) times daily. 630mg    Cholecalciferol (VITAMIN D3) 2000 units TABS Take 1 tablet by mouth daily.   esomeprazole (NEXIUM) 40 MG capsule Take 40 mg by mouth daily as needed.   Fluocinolone Acetonide 0.01 % OIL Place 3 drops in ear(s) 2 (two) times daily as needed.   Magnesium 250 MG TABS Take 1 tablet by mouth daily.   Multiple Vitamin (MULTIVITAMIN) tablet Take 1 tablet by mouth daily.   nystatin ointment (MYCOSTATIN) Apply 1 application topically 2 (two) times daily as needed.   vitamin B-12 (CYANOCOBALAMIN) 1000 MCG tablet Take 5,000 mcg by mouth daily.   vitamin E 100 UNIT capsule Take 200 Units by mouth daily.   No facility-administered encounter medications on file as  of 11/01/2020.   GEN CALL: Patient stated she has some pollen concerns since the weather has changed. I suggested some OTC medications to help with allergies. She stated she walks sometimes but has talked about going to the gym with her husband. She stated she gets one meal delivered to her and her husband from the kitchen where they live but the rest of the meals are left up to her. She stated she makes her and her husband a well rounded diet. She stated she believes she took the prolia application to the office but cant 100% remember so she is going to look around and check and see If its laying around the house.  Star Rating Drugs:N/A  Follow-Up:Pharmacist Review   Charlann Lange, RMA Clinical Pharmacist Assistant [phone]  10 minutes spent in review, coordination, and documentation.  Reviewed by: Beverly Milch, PharmD Clinical Pharmacist [facility] [phone]

## 2020-11-08 ENCOUNTER — Other Ambulatory Visit: Payer: Self-pay

## 2020-11-08 ENCOUNTER — Other Ambulatory Visit: Payer: PPO

## 2020-11-08 DIAGNOSIS — E039 Hypothyroidism, unspecified: Secondary | ICD-10-CM

## 2020-11-09 LAB — TSH: TSH: 5.12 mIU/L — ABNORMAL HIGH (ref 0.40–4.50)

## 2020-11-11 ENCOUNTER — Other Ambulatory Visit: Payer: Self-pay | Admitting: *Deleted

## 2020-11-11 MED ORDER — THYROID 60 MG PO TABS: ORAL_TABLET | ORAL | 1 refills | Status: DC

## 2020-11-15 ENCOUNTER — Encounter: Payer: Self-pay | Admitting: Family Medicine

## 2020-11-15 ENCOUNTER — Other Ambulatory Visit: Payer: Self-pay

## 2020-11-15 ENCOUNTER — Ambulatory Visit (INDEPENDENT_AMBULATORY_CARE_PROVIDER_SITE_OTHER): Payer: PPO | Admitting: Family Medicine

## 2020-11-15 VITALS — BP 128/74 | HR 80 | Temp 98.2°F | Resp 16 | Ht 63.0 in | Wt 105.0 lb

## 2020-11-15 DIAGNOSIS — E039 Hypothyroidism, unspecified: Secondary | ICD-10-CM | POA: Diagnosis not present

## 2020-11-15 MED ORDER — THYROID 60 MG PO TABS: ORAL_TABLET | ORAL | 1 refills | Status: DC

## 2020-11-15 NOTE — Progress Notes (Signed)
Subjective:    Patient ID: Joanne Gomez, female    DOB: 02/06/1931, 87 y.o.   MRN: [id number]  Most recent TSH was elevated at greater than 5.  This was with the patient taking 60 mg a day of Armour Thyroid.  She does not want to switch to levothyroxine.  But she is interested in trying to improve the management of her hypothyroidism. Past Medical History:  Diagnosis Date   Cancer ([place]) 11/2007   colon stage II   Diverticula of colon    GERD (gastroesophageal reflux disease)    H/O colon cancer, stage II 11/15/2011   HH (hiatus hernia)    History of hiatal hernia    HOH (hard of hearing)    MILD LOSS   Hyperlipidemia    Hypothyroidism    Osteoporosis    Pneumonia    Past Surgical History:  Procedure Laterality Date   ABDOMINAL HYSTERECTOMY     APPENDECTOMY     CATARACT EXTRACTION W/PHACO Right 08/30/2015   Procedure: CATARACT EXTRACTION PHACO AND INTRAOCULAR LENS PLACEMENT ([place]);  Surgeon: Birder Robson, MD;  Location: [facility] ORS;  Service: Ophthalmology;  Laterality: Right;   [place]    00:40.8 AP%  15.7 CDE    6.42   fluid pack lot #[id number] H  exp 02/24/2017   CATARACT EXTRACTION W/PHACO Left 09/20/2015   Procedure: CATARACT EXTRACTION PHACO AND INTRAOCULAR LENS PLACEMENT (IOC);  Surgeon: Birder Robson, MD;  Location: [facility] ORS;  Service: Ophthalmology;  Laterality: Left;  [place] 44.8 AP% 19.6 CDE 8.77 FLUID PACK LOT # [id number]   CHOLECYSTECTOMY     ELBOW SURGERY     right    hemocolectomy  2009   VAGINAL PROLAPSE REPAIR     Current Outpatient Medications on File Prior to Visit  Medication Sig Dispense Refill   amLODipine (NORVASC) 2.5 MG tablet Take 1 tablet (2.5 mg total) by mouth daily. 90 tablet 3   CALCIUM CITRATE PO Take 1 tablet by mouth 2 (two) times daily. 630mg      Cholecalciferol (VITAMIN D3) 2000 units TABS Take 1 tablet by mouth daily.     esomeprazole (NEXIUM) 40 MG capsule Take 40 mg by mouth daily as needed.     Fluocinolone Acetonide 0.01 % OIL Place 3 drops  in ear(s) 2 (two) times daily as needed. 20 mL 0   Magnesium 250 MG TABS Take 1 tablet by mouth daily.     Multiple Vitamin (MULTIVITAMIN) tablet Take 1 tablet by mouth daily.     nystatin ointment (MYCOSTATIN) Apply 1 application topically 2 (two) times daily as needed.     vitamin B-12 (CYANOCOBALAMIN) 1000 MCG tablet Take 5,000 mcg by mouth daily.     vitamin E 100 UNIT capsule Take 200 Units by mouth daily.     No current facility-administered medications on file prior to visit.   No Known Allergies Social History   Socioeconomic History   Marital status: Married    Spouse name: Not on file   Number of children: Not on file   Years of education: Not on file   Highest education level: Not on file  Occupational History   Not on file  Tobacco Use   Smoking status: Never   Smokeless tobacco: Never  Vaping Use   Vaping Use: Never used  Substance and Sexual Activity   Alcohol use: Yes    Comment: occ   Drug use: No   Sexual activity: Yes    Comment: Married to [place], retired.  Other Topics Concern   Not on file  Social History Narrative   Not on file   Social Determinants of Health   Financial Resource Strain: Low Risk    Difficulty of Paying Living Expenses: Not very hard  Food Insecurity: Not on file  Transportation Needs: Not on file  Physical Activity: Not on file  Stress: Not on file  Social Connections: Not on file  Intimate Partner Violence: Not on file      Review of Systems  All other systems reviewed and are negative.     Objective:   Physical Exam Vitals reviewed.  Constitutional:      General: She is not in acute distress.    Appearance: Normal appearance. She is normal weight. She is not ill-appearing or toxic-appearing.  Cardiovascular:     Rate and Rhythm: Normal rate and regular rhythm.     Heart sounds: Normal heart sounds.  Pulmonary:     Effort: Pulmonary effort is normal. No respiratory distress.     Breath sounds: Normal breath  sounds. No stridor. No wheezing or rhonchi.  Abdominal:     General: Abdomen is flat. Bowel sounds are normal. There is no distension.     Palpations: Abdomen is soft. There is no mass.     Tenderness: There is no abdominal tenderness. There is no guarding or rebound.     Hernia: No hernia is present.  Neurological:     Mental Status: She is alert.          Assessment & Plan:  Hypothyroidism, unspecified type Recommended alternating 60 mg with 90 mg every other day of Armour Thyroid.  Therefore she will take 90 mg of Armour Thyroid on Monday, Wednesday, Friday which is 1-1/2 tablets.  She will take 1 tablet or 60 mg on Tuesday, Thursday, Saturday, Sunday and recheck TSH in 6 to 8 weeks.

## 2020-11-21 DIAGNOSIS — R293 Abnormal posture: Secondary | ICD-10-CM | POA: Diagnosis not present

## 2020-11-21 DIAGNOSIS — M542 Cervicalgia: Secondary | ICD-10-CM | POA: Diagnosis not present

## 2020-11-21 DIAGNOSIS — G4486 Cervicogenic headache: Secondary | ICD-10-CM | POA: Diagnosis not present

## 2020-11-23 DIAGNOSIS — G4486 Cervicogenic headache: Secondary | ICD-10-CM | POA: Diagnosis not present

## 2020-11-23 DIAGNOSIS — M542 Cervicalgia: Secondary | ICD-10-CM | POA: Diagnosis not present

## 2020-11-23 DIAGNOSIS — R293 Abnormal posture: Secondary | ICD-10-CM | POA: Diagnosis not present

## 2020-11-25 ENCOUNTER — Ambulatory Visit: Payer: PPO

## 2020-11-25 ENCOUNTER — Telehealth: Payer: Self-pay | Admitting: Family Medicine

## 2020-11-25 DIAGNOSIS — G4486 Cervicogenic headache: Secondary | ICD-10-CM | POA: Diagnosis not present

## 2020-11-25 DIAGNOSIS — M542 Cervicalgia: Secondary | ICD-10-CM | POA: Diagnosis not present

## 2020-11-25 DIAGNOSIS — R293 Abnormal posture: Secondary | ICD-10-CM | POA: Diagnosis not present

## 2020-11-25 NOTE — Telephone Encounter (Signed)
Patient called to inquire about next follow up appointment; dose for thyroid meds changed at last visit. Please advise at [phone].

## 2020-11-29 DIAGNOSIS — M542 Cervicalgia: Secondary | ICD-10-CM | POA: Diagnosis not present

## 2020-11-29 DIAGNOSIS — G4486 Cervicogenic headache: Secondary | ICD-10-CM | POA: Diagnosis not present

## 2020-11-29 DIAGNOSIS — R293 Abnormal posture: Secondary | ICD-10-CM | POA: Diagnosis not present

## 2020-11-29 NOTE — Telephone Encounter (Signed)
Call placed to patient.   Advised that last OV was 10/2020. PCP did make changes to thyroid dose, and requested labs in 2 months (mid- August). Also advised that next appointment will be due in December 2022. Advised Prolia is due at the end of July.

## 2020-12-01 ENCOUNTER — Other Ambulatory Visit: Payer: Self-pay

## 2020-12-01 ENCOUNTER — Ambulatory Visit (INDEPENDENT_AMBULATORY_CARE_PROVIDER_SITE_OTHER): Payer: PPO

## 2020-12-01 VITALS — BP 110/70 | Temp 98.2°F | Ht 63.0 in | Wt 104.0 lb

## 2020-12-01 DIAGNOSIS — M542 Cervicalgia: Secondary | ICD-10-CM | POA: Diagnosis not present

## 2020-12-01 DIAGNOSIS — R293 Abnormal posture: Secondary | ICD-10-CM | POA: Diagnosis not present

## 2020-12-01 DIAGNOSIS — Z0000 Encounter for general adult medical examination without abnormal findings: Secondary | ICD-10-CM

## 2020-12-01 DIAGNOSIS — G4486 Cervicogenic headache: Secondary | ICD-10-CM | POA: Diagnosis not present

## 2020-12-01 NOTE — Patient Instructions (Signed)
Joanne Gomez , Thank you for taking time to come for your Medicare Wellness Visit. I appreciate your ongoing commitment to your health goals. Please review the following plan we discussed and let me know if I can assist you in the future.   Screening recommendations/referrals: Colonoscopy: No longer required  Mammogram: Up to date, next due 11/01/2021 Bone Density: Up to date, next due 08/31/2021 Recommended yearly ophthalmology/optometry visit for glaucoma screening and checkup Recommended yearly dental visit for hygiene and checkup  Vaccinations: Influenza vaccine: Up to date, next due fall 2022  Pneumococcal vaccine: Completed series  Tdap vaccine: Up to date, next due 05/02/2021 Shingles vaccine: Currently due for Shingrix, you may receive this at your local pharmacy.    Advanced directives: Please bring in a copy of your advanced medical directives so that we may scan them into your chart.  Conditions/risks identified: None   Next appointment: None    Preventive Care 65 Years and Older, Female Preventive care refers to lifestyle choices and visits with your health care provider that can promote health and wellness. What does preventive care include? A yearly physical exam. This is also called an annual well check. Dental exams once or twice a year. Routine eye exams. Ask your health care provider how often you should have your eyes checked. Personal lifestyle choices, including: Daily care of your teeth and gums. Regular physical activity. Eating a healthy diet. Avoiding tobacco and drug use. Limiting alcohol use. Practicing safe sex. Taking low-dose aspirin every day. Taking vitamin and mineral supplements as recommended by your health care provider. What happens during an annual well check? The services and screenings done by your health care provider during your annual well check will depend on your age, overall health, lifestyle risk factors, and family history of  disease. Counseling  Your health care provider may ask you questions about your: Alcohol use. Tobacco use. Drug use. Emotional well-being. Home and relationship well-being. Sexual activity. Eating habits. History of falls. Memory and ability to understand (cognition). Work and work Statistician. Reproductive health. Screening  You may have the following tests or measurements: Height, weight, and BMI. Blood pressure. Lipid and cholesterol levels. These may be checked every 5 years, or more frequently if you are over 17 years old. Skin check. Lung cancer screening. You may have this screening every year starting at age 70 if you have a 30-pack-year history of smoking and currently smoke or have quit within the past 15 years. Fecal occult blood test (FOBT) of the stool. You may have this test every year starting at age 38. Flexible sigmoidoscopy or colonoscopy. You may have a sigmoidoscopy every 5 years or a colonoscopy every 10 years starting at age 91. Hepatitis C blood test. Hepatitis B blood test. Sexually transmitted disease (STD) testing. Diabetes screening. This is done by checking your blood sugar (glucose) after you have not eaten for a while (fasting). You may have this done every 1-3 years. Bone density scan. This is done to screen for osteoporosis. You may have this done starting at age 21. Mammogram. This may be done every 1-2 years. Talk to your health care provider about how often you should have regular mammograms. Talk with your health care provider about your test results, treatment options, and if necessary, the need for more tests. Vaccines  Your health care provider may recommend certain vaccines, such as: Influenza vaccine. This is recommended every year. Tetanus, diphtheria, and acellular pertussis (Tdap, Td) vaccine. You may need a Td booster  every 10 years. Zoster vaccine. You may need this after age 32. Pneumococcal 13-valent conjugate (PCV13) vaccine. One  dose is recommended after age 23. Pneumococcal polysaccharide (PPSV23) vaccine. One dose is recommended after age 28. Talk to your health care provider about which screenings and vaccines you need and how often you need them. This information is not intended to replace advice given to you by your health care provider. Make sure you discuss any questions you have with your health care provider. Document Released: 06/10/2015 Document Revised: 02/01/2016 Document Reviewed: 03/15/2015 Elsevier Interactive Patient Education  2017 [place] Prevention in the Home Falls can cause injuries. They can happen to people of all ages. There are many things you can do to make your home safe and to help prevent falls. What can I do on the outside of my home? Regularly fix the edges of walkways and driveways and fix any cracks. Remove anything that might make you trip as you walk through a door, such as a raised step or threshold. Trim any bushes or trees on the path to your home. Use bright outdoor lighting. Clear any walking paths of anything that might make someone trip, such as rocks or tools. Regularly check to see if handrails are loose or broken. Make sure that both sides of any steps have handrails. Any raised decks and porches should have guardrails on the edges. Have any leaves, snow, or ice cleared regularly. Use sand or salt on walking paths during winter. Clean up any spills in your garage right away. This includes oil or grease spills. What can I do in the bathroom? Use night lights. Install grab bars by the toilet and in the tub and shower. Do not use towel bars as grab bars. Use non-skid mats or decals in the tub or shower. If you need to sit down in the shower, use a plastic, non-slip stool. Keep the floor dry. Clean up any water that spills on the floor as soon as it happens. Remove soap buildup in the tub or shower regularly. Attach bath mats securely with double-sided  non-slip rug tape. Do not have throw rugs and other things on the floor that can make you trip. What can I do in the bedroom? Use night lights. Make sure that you have a light by your bed that is easy to reach. Do not use any sheets or blankets that are too big for your bed. They should not hang down onto the floor. Have a firm chair that has side arms. You can use this for support while you get dressed. Do not have throw rugs and other things on the floor that can make you trip. What can I do in the kitchen? Clean up any spills right away. Avoid walking on wet floors. Keep items that you use a lot in easy-to-reach places. If you need to reach something above you, use a strong step stool that has a grab bar. Keep electrical cords out of the way. Do not use floor polish or wax that makes floors slippery. If you must use wax, use non-skid floor wax. Do not have throw rugs and other things on the floor that can make you trip. What can I do with my stairs? Do not leave any items on the stairs. Make sure that there are handrails on both sides of the stairs and use them. Fix handrails that are broken or loose. Make sure that handrails are as long as the stairways. Check any carpeting to  make sure that it is firmly attached to the stairs. Fix any carpet that is loose or worn. Avoid having throw rugs at the top or bottom of the stairs. If you do have throw rugs, attach them to the floor with carpet tape. Make sure that you have a light switch at the top of the stairs and the bottom of the stairs. If you do not have them, ask someone to add them for you. What else can I do to help prevent falls? Wear shoes that: Do not have high heels. Have rubber bottoms. Are comfortable and fit you well. Are closed at the toe. Do not wear sandals. If you use a stepladder: Make sure that it is fully opened. Do not climb a closed stepladder. Make sure that both sides of the stepladder are locked into place. Ask  someone to hold it for you, if possible. Clearly mark and make sure that you can see: Any grab bars or handrails. First and last steps. Where the edge of each step is. Use tools that help you move around (mobility aids) if they are needed. These include: Canes. Walkers. Scooters. Crutches. Turn on the lights when you go into a dark area. Replace any light bulbs as soon as they burn out. Set up your furniture so you have a clear path. Avoid moving your furniture around. If any of your floors are uneven, fix them. If there are any pets around you, be aware of where they are. Review your medicines with your doctor. Some medicines can make you feel dizzy. This can increase your chance of falling. Ask your doctor what other things that you can do to help prevent falls. This information is not intended to replace advice given to you by your health care provider. Make sure you discuss any questions you have with your health care provider. Document Released: 03/10/2009 Document Revised: 10/20/2015 Document Reviewed: 06/18/2014 Elsevier Interactive Patient Education  2017 [employer].

## 2020-12-01 NOTE — Progress Notes (Signed)
Subjective:   Joanne Gomez is a 87 y.o. female who presents for Medicare Annual (Subsequent) preventive examination.  Review of Systems    N/A  Cardiac Risk Factors include: advanced age (>98men, >75 women);hypertension     Objective:    Today's Vitals   12/01/20 0908 12/01/20 0909  BP: 110/70   Temp: 98.2 F (36.8 C)   TempSrc: Temporal   Weight: 104 lb (47.2 kg)   Height: 5\' 3"  (1.6 m)   PainSc:  7    Body mass index is 18.42 kg/m.  Advanced Directives 12/01/2020 01/16/2016 08/30/2015 01/17/2015 01/18/2014  Does Patient Have a Medical Advance Directive? Yes Yes Yes No Yes  Type of Paramedic of [place];Living will [place];Living will [place];Living will - [place];Living will  Does patient want to make changes to medical advance directive? No - Patient declined - No - Patient declined - -  Copy of [place] in Chart? No - copy requested Yes No - copy requested - No - copy requested  Would patient like information on creating a medical advance directive? - - - No - patient declined information -    Current Medications (verified) Outpatient Encounter Medications as of 12/01/2020  Medication Sig   CALCIUM CITRATE PO Take 1 tablet by mouth 2 (two) times daily. 630mg    Cholecalciferol (VITAMIN D3) 2000 units TABS Take 1 tablet by mouth daily.   esomeprazole (NEXIUM) 40 MG capsule Take 40 mg by mouth daily as needed.   Fluocinolone Acetonide 0.01 % OIL Place 3 drops in ear(s) 2 (two) times daily as needed.   Magnesium 250 MG TABS Take 1 tablet by mouth daily.   Multiple Vitamin (MULTIVITAMIN) tablet Take 1 tablet by mouth daily.   nystatin ointment (MYCOSTATIN) Apply 1 application topically 2 (two) times daily as needed.   thyroid (ARMOUR THYROID) 60 MG tablet Take (1.5) tabs 4x weekly, and take (1) tab 3x weekly   vitamin B-12 (CYANOCOBALAMIN) 1000 MCG tablet Take 5,000 mcg  by mouth daily.   vitamin E 100 UNIT capsule Take 200 Units by mouth daily.   amLODipine (NORVASC) 2.5 MG tablet Take 1 tablet (2.5 mg total) by mouth daily. (Patient not taking: Reported on 12/01/2020)   No facility-administered encounter medications on file as of 12/01/2020.    Allergies (verified) Patient has no known allergies.   History: Past Medical History:  Diagnosis Date   Cancer ([place]) 11/2007   colon stage II   Diverticula of colon    GERD (gastroesophageal reflux disease)    H/O colon cancer, stage II 11/15/2011   HH (hiatus hernia)    History of hiatal hernia    HOH (hard of hearing)    MILD LOSS   Hyperlipidemia    Hypothyroidism    Osteoporosis    Pneumonia    Past Surgical History:  Procedure Laterality Date   ABDOMINAL HYSTERECTOMY     APPENDECTOMY     CATARACT EXTRACTION W/PHACO Right 08/30/2015   Procedure: CATARACT EXTRACTION PHACO AND INTRAOCULAR LENS PLACEMENT ([place]);  Surgeon: Birder Robson, MD;  Location: [facility] ORS;  Service: Ophthalmology;  Laterality: Right;   [place]    00:40.8 AP%  15.7 CDE    6.42   fluid pack lot #[id number]  exp 02/24/2017   CATARACT EXTRACTION W/PHACO Left 09/20/2015   Procedure: CATARACT EXTRACTION PHACO AND INTRAOCULAR LENS PLACEMENT ([facility]);  Surgeon: Birder Robson, MD;  Location: [facility] ORS;  Service:  Ophthalmology;  Laterality: Left;  [place] 44.8 AP% 19.6 CDE 8.77 FLUID PACK LOT # [id number]   CHOLECYSTECTOMY     ELBOW SURGERY     right    hemocolectomy  2009   VAGINAL PROLAPSE REPAIR     Family History  Problem Relation Age of Onset   Heart attack Sister 103   Hyperlipidemia Sister    Hypertension Sister    Breast cancer Neg Hx    Social History   Socioeconomic History   Marital status: Married    Spouse name: Not on file   Number of children: Not on file   Years of education: Not on file   Highest education level: Not on file  Occupational History   Not on file  Tobacco Use   Smoking status: Never   Smokeless tobacco:  Never  Vaping Use   Vaping Use: Never used  Substance and Sexual Activity   Alcohol use: Yes    Comment: occ   Drug use: No   Sexual activity: Yes    Comment: Married to [place], retired.  Other Topics Concern   Not on file  Social History Narrative   Not on file   Social Determinants of Health   Financial Resource Strain: Low Risk    Difficulty of Paying Living Expenses: Not hard at all  Food Insecurity: No Food Insecurity   Worried About Charity fundraiser in the Last Year: Never true   [place] in the Last Year: Never true  Transportation Needs: No Transportation Needs   Lack of Transportation (Medical): No   Lack of Transportation (Non-Medical): No  Physical Activity: Insufficiently Active   Days of Exercise per Week: 3 days   Minutes of Exercise per Session: 20 min  Stress: No Stress Concern Present   Feeling of Stress : Not at all  Social Connections: Moderately Integrated   Frequency of Communication with Friends and Family: More than three times a week   Frequency of Social Gatherings with Friends and Family: Three times a week   Attends Religious Services: 1 to 4 times per year   Active Member of Clubs or Organizations: No   Attends Archivist Meetings: Never   Marital Status: Married    Tobacco Counseling Counseling given: Not Answered   Clinical Intake:  Pre-visit preparation completed: Yes  Pain : 0-10 Pain Score: 7  Pain Type: Chronic pain Pain Location: Neck Pain Frequency: Intermittent Pain Relieving Factors: heat, resting  Pain Relieving Factors: heat, resting  Nutritional Risks: None Diabetes: No  How often do you need to have someone help you when you read instructions, pamphlets, or other written materials from your doctor or pharmacy?: 1 - Never  Diabetic?No   Interpreter Needed?: No  Information entered by :: [place] of Daily Living In your present state of health, do you have any difficulty  performing the following activities: 12/01/2020  Hearing? Y  Vision? N  Difficulty concentrating or making decisions? N  Walking or climbing stairs? N  Dressing or bathing? N  Doing errands, shopping? Y  Preparing Food and eating ? N  Using the Toilet? N  In the past six months, have you accidently leaked urine? N  Do you have problems with loss of bowel control? N  Managing your Medications? N  Managing your Finances? N  Housekeeping or managing your Housekeeping? N  Some recent data might be hidden    Patient Care Team: Susy Frizzle, MD  as PCP - General (Family Medicine) Edythe Clarity, [facility] as Pharmacist (Pharmacist)  Indicate any recent Medical Services you may have received from other than [facility] providers in the past year (date may be approximate).     Assessment:   This is a routine wellness examination for [place].  Hearing/Vision screen Vision Screening - Comments:: Patient states that she gets her eyes examined every 3 months. Has chronic dry eyes, and wears glasses   Dietary issues and exercise activities discussed: Current Exercise Habits: Home exercise routine, Type of exercise: walking, Time (Minutes): 20, Frequency (Times/Week): 3, Weekly Exercise (Minutes/Week): 60, Intensity: Mild, Exercise limited by: None identified   Goals Addressed             This Visit's Progress    Exercise 3x per week (30 min per time)         Depression Screen PHQ 2/9 Scores 12/01/2020 11/15/2020 08/21/2019 07/15/2018 09/03/2017 08/02/2017 01/10/2017  PHQ - 2 Score 0 0 0 0 0 0 0  PHQ- 9 Score - - - - - - 3    Fall Risk Fall Risk  12/01/2020 11/15/2020 08/21/2019 07/15/2018 09/03/2017  Falls in the past year? 0 0 0 1 No  Number falls in past yr: 0 0 - 0 -  Injury with Fall? 0 0 - - -  Risk for fall due to : No Fall Risks No Fall Risks - - -  Follow up Falls evaluation completed;Falls prevention discussed Falls evaluation completed Falls evaluation completed Falls evaluation completed  -    FALL RISK PREVENTION PERTAINING TO THE HOME:  Any stairs in or around the home? No  If so, are there any without handrails? No  Home free of loose throw rugs in walkways, pet beds, electrical cords, etc? Yes  Adequate lighting in your home to reduce risk of falls? Yes   ASSISTIVE DEVICES UTILIZED TO PREVENT FALLS:  Life alert? No  Use of a cane, walker or w/c? No  Grab bars in the bathroom? Yes  Shower chair or bench in shower? Yes  Elevated toilet seat or a handicapped toilet? Yes   TIMED UP AND GO:  Was the test performed? Yes .  Length of time to ambulate 10 feet: 4 sec.   Gait steady and fast without use of assistive device  Cognitive Function:     6CIT Screen 12/01/2020  What Year? 0 points  What month? 0 points  What time? 0 points  Count back from 20 0 points  Months in reverse 0 points  Repeat phrase 6 points  Total Score 6    Immunizations Immunization History  Administered Date(s) Administered   Fluad Quad(high Dose 65+) 02/17/2019, 02/16/2020   Influenza, High Dose Seasonal PF 03/01/2017, 03/06/2018   Influenza,inj,Quad PF,6+ Mos 03/10/2013, 03/08/2014, 03/17/2015, 03/13/2016   PFIZER(Purple Top)SARS-COV-2 Vaccination 06/15/2019, 07/06/2019, 03/16/2020   Pneumococcal Conjugate-13 05/11/2013   Pneumococcal Polysaccharide-23 05/31/2014   Td 02/25/1996   Tdap 05/03/2011   Zoster, Live 02/23/2011    TDAP status: Up to date  Flu Vaccine status: Up to date  Pneumococcal vaccine status: Up to date  Covid-19 vaccine status: Completed vaccines  Qualifies for Shingles Vaccine? Yes   Zostavax completed Yes   Shingrix Completed?: No.    Education has been provided regarding the importance of this vaccine. Patient has been advised to call insurance company to determine out of pocket expense if they have not yet received this vaccine. Advised may also receive vaccine at local pharmacy or Health  Dept. Verbalized acceptance and understanding.  Screening  Tests Health Maintenance  Topic Date Due   Zoster Vaccines- Shingrix (1 of 2) Never done   COVID-19 Vaccine (4 - Booster for [employer] series) 07/17/2020   INFLUENZA VACCINE  12/26/2020   TETANUS/TDAP  05/02/2021   DEXA SCAN  Completed   PNA vac Low Risk Adult  Completed   HPV VACCINES  Aged Out    Health Maintenance  Health Maintenance Due  Topic Date Due   Zoster Vaccines- Shingrix (1 of 2) Never done   COVID-19 Vaccine (4 - Booster for [employer] series) 07/17/2020    Colorectal cancer screening: No longer required.   Mammogram status: Completed 11/01/2020. Repeat every year  Bone Density status: Completed 09/01/2019. Results reflect: Bone density results: OSTEOPOROSIS. Repeat every 2 years.  Lung Cancer Screening: (Low Dose CT Chest recommended if Age 68-80 years, 30 pack-year currently smoking OR have quit w/in 15years.) does not qualify.   Lung Cancer Screening Referral: N/A   Additional Screening:  Hepatitis C Screening: does not qualify;   Vision Screening: Recommended annual ophthalmology exams for early detection of glaucoma and other disorders of the eye. Is the patient up to date with their annual eye exam?  Yes  Who is the provider or what is the name of the office in which the patient attends annual eye exams? Dr. Drinda Butts If pt is not established with a provider, would they like to be referred to a provider to establish care? No .   Dental Screening: Recommended annual dental exams for proper oral hygiene  Community Resource Referral / Chronic Care Management: CRR required this visit?  No   CCM required this visit?  No      Plan:     I have personally reviewed and noted the following in the patient's chart:   Medical and social history Use of alcohol, tobacco or illicit drugs  Current medications and supplements including opioid prescriptions.  Functional ability and status Nutritional status Physical activity Advanced directives List of other  physicians Hospitalizations, surgeries, and ER visits in previous 12 months Vitals Screenings to include cognitive, depression, and falls Referrals and appointments  In addition, I have reviewed and discussed with patient certain preventive protocols, quality metrics, and best practice recommendations. A written personalized care plan for preventive services as well as general preventive health recommendations were provided to patient.     Ofilia Neas, LPN   02/03/3569   Nurse Notes: None

## 2020-12-02 ENCOUNTER — Other Ambulatory Visit: Payer: Self-pay

## 2020-12-02 DIAGNOSIS — Z23 Encounter for immunization: Secondary | ICD-10-CM

## 2020-12-02 MED ORDER — SHINGRIX 50 MCG/0.5ML IM SUSR: INTRAMUSCULAR | 1 refills | Status: DC

## 2020-12-06 DIAGNOSIS — G4486 Cervicogenic headache: Secondary | ICD-10-CM | POA: Diagnosis not present

## 2020-12-06 DIAGNOSIS — R293 Abnormal posture: Secondary | ICD-10-CM | POA: Diagnosis not present

## 2020-12-06 DIAGNOSIS — M542 Cervicalgia: Secondary | ICD-10-CM | POA: Diagnosis not present

## 2020-12-08 ENCOUNTER — Telehealth: Payer: Self-pay | Admitting: *Deleted

## 2020-12-08 DIAGNOSIS — M542 Cervicalgia: Secondary | ICD-10-CM | POA: Diagnosis not present

## 2020-12-08 DIAGNOSIS — G4486 Cervicogenic headache: Secondary | ICD-10-CM | POA: Diagnosis not present

## 2020-12-08 DIAGNOSIS — R293 Abnormal posture: Secondary | ICD-10-CM | POA: Diagnosis not present

## 2020-12-08 NOTE — Telephone Encounter (Signed)
Patient returned call.   Reports that she is having increased pain in her neck. States that she has been seeing PT for neck pain and they recommended x-ray.   Appointment scheduled with PCP for evaluation.

## 2020-12-08 NOTE — Telephone Encounter (Signed)
Call placed to patient to inquire. LMTRC. 

## 2020-12-08 NOTE — Telephone Encounter (Signed)
pt asking for cb regarding imaging that is supposed to be ordered for her byher therapist Clabe Seal, I did not see any orders for pt

## 2020-12-09 ENCOUNTER — Ambulatory Visit
Admission: RE | Admit: 2020-12-09 | Discharge: 2020-12-09 | Disposition: A | Discharge status: Home / Self Care | Payer: PPO | Source: Ambulatory Visit | Attending: Family Medicine | Admitting: Family Medicine

## 2020-12-09 ENCOUNTER — Other Ambulatory Visit: Payer: Self-pay

## 2020-12-09 ENCOUNTER — Encounter: Payer: Self-pay | Admitting: Family Medicine

## 2020-12-09 ENCOUNTER — Ambulatory Visit (INDEPENDENT_AMBULATORY_CARE_PROVIDER_SITE_OTHER): Payer: PPO | Admitting: Family Medicine

## 2020-12-09 ENCOUNTER — Ambulatory Visit: Payer: PPO | Admitting: Family Medicine

## 2020-12-09 VITALS — BP 134/78 | HR 80 | Temp 98.4°F | Resp 16 | Ht 63.0 in | Wt 103.8 lb

## 2020-12-09 DIAGNOSIS — M542 Cervicalgia: Secondary | ICD-10-CM

## 2020-12-09 MED ORDER — MELOXICAM 15 MG PO TABS: 15.0000 mg | ORAL_TABLET | Freq: Every day | ORAL | 0 refills | Status: DC

## 2020-12-09 NOTE — Progress Notes (Signed)
Subjective:    Patient ID: Joanne Gomez, female    DOB: 1931-04-06, 87 y.o.   MRN: [id number]  Patient complains of pain in her neck.  Is located to the left of the spinous processes particularly bad at C4-C5 however it occasionally hurts all the way down to C7.  She is tender to palpation over the transverse process of C4 I believe.  She denies any falls or injuries.  She denies any numbness or tingling in her arms.  She denies any weakness in her arms.  She has normal reflexes checked at the brachioradialis and the bicep.  She has normal grip strength.  She has full range of motion in both upper extremities.  She does have pain with lateral rotation of the neck and forward flexion.  She states that this is been going on gradually over the last month or so.  She has been taking Aleve with little relief Past Medical History:  Diagnosis Date   Cancer ([place]) 11/2007   colon stage II   Diverticula of colon    GERD (gastroesophageal reflux disease)    H/O colon cancer, stage II 11/15/2011   HH (hiatus hernia)    History of hiatal hernia    HOH (hard of hearing)    MILD LOSS   Hyperlipidemia    Hypothyroidism    Osteoporosis    Pneumonia    Past Surgical History:  Procedure Laterality Date   ABDOMINAL HYSTERECTOMY     APPENDECTOMY     CATARACT EXTRACTION W/PHACO Right 08/30/2015   Procedure: CATARACT EXTRACTION PHACO AND INTRAOCULAR LENS PLACEMENT ([place]);  Surgeon: Birder Robson, MD;  Location: [facility] ORS;  Service: Ophthalmology;  Laterality: Right;   [place]    00:40.8 AP%  15.7 CDE    6.42   fluid pack lot #[id number]  exp 02/24/2017   CATARACT EXTRACTION W/PHACO Left 09/20/2015   Procedure: CATARACT EXTRACTION PHACO AND INTRAOCULAR LENS PLACEMENT ([facility]);  Surgeon: Birder Robson, MD;  Location: [facility] ORS;  Service: Ophthalmology;  Laterality: Left;  [place] 44.8 AP% 19.6 CDE 8.77 FLUID PACK LOT # [id number]   CHOLECYSTECTOMY     ELBOW SURGERY     right    hemocolectomy  2009   VAGINAL PROLAPSE  REPAIR     Current Outpatient Medications on File Prior to Visit  Medication Sig Dispense Refill   amLODipine (NORVASC) 2.5 MG tablet Take 1 tablet (2.5 mg total) by mouth daily. 90 tablet 3   CALCIUM CITRATE PO Take 1 tablet by mouth 2 (two) times daily. 630mg      Cholecalciferol (VITAMIN D3) 2000 units TABS Take 1 tablet by mouth daily.     esomeprazole (NEXIUM) 40 MG capsule Take 40 mg by mouth daily as needed.     Fluocinolone Acetonide 0.01 % OIL Place 3 drops in ear(s) 2 (two) times daily as needed. 20 mL 0   Magnesium 250 MG TABS Take 1 tablet by mouth daily.     Multiple Vitamin (MULTIVITAMIN) tablet Take 1 tablet by mouth daily.     nystatin ointment (MYCOSTATIN) Apply 1 application topically 2 (two) times daily as needed.     thyroid (ARMOUR THYROID) 60 MG tablet Take (1.5) tabs 4x weekly, and take (1) tab 3x weekly 135 tablet 1   vitamin B-12 (CYANOCOBALAMIN) 1000 MCG tablet Take 5,000 mcg by mouth daily.     vitamin E 100 UNIT capsule Take 200 Units by mouth daily.     Zoster Vaccine Adjuvanted [facility]) injection Repeat in  2 to 6 months 0.5 mL 1   No current facility-administered medications on file prior to visit.   No Known Allergies Social History   Socioeconomic History   Marital status: Married    Spouse name: Not on file   Number of children: Not on file   Years of education: Not on file   Highest education level: Not on file  Occupational History   Not on file  Tobacco Use   Smoking status: Never   Smokeless tobacco: Never  Vaping Use   Vaping Use: Never used  Substance and Sexual Activity   Alcohol use: Yes    Comment: occ   Drug use: No   Sexual activity: Yes    Comment: Married to [place], retired.  Other Topics Concern   Not on file  Social History Narrative   Not on file   Social Determinants of Health   Financial Resource Strain: Low Risk    Difficulty of Paying Living Expenses: Not hard at all  Food Insecurity: No Food Insecurity   Worried  About Charity fundraiser in the Last Year: Never true   [place] in the Last Year: Never true  Transportation Needs: No Transportation Needs   Lack of Transportation (Medical): No   Lack of Transportation (Non-Medical): No  Physical Activity: Insufficiently Active   Days of Exercise per Week: 3 days   Minutes of Exercise per Session: 20 min  Stress: No Stress Concern Present   Feeling of Stress : Not at all  Social Connections: Moderately Integrated   Frequency of Communication with Friends and Family: More than three times a week   Frequency of Social Gatherings with Friends and Family: Three times a week   Attends Religious Services: 1 to 4 times per year   Active Member of Clubs or Organizations: No   Attends Archivist Meetings: Never   Marital Status: Married  Human resources officer Violence: Not At Risk   Fear of Current or Ex-Partner: No   Emotionally Abused: No   Physically Abused: No   Sexually Abused: No      Review of Systems  All other systems reviewed and are negative.     Objective:   Physical Exam Vitals reviewed.  Constitutional:      General: She is not in acute distress.    Appearance: Normal appearance. She is normal weight. She is not ill-appearing or toxic-appearing.  Neck:   Cardiovascular:     Rate and Rhythm: Normal rate and regular rhythm.     Heart sounds: Normal heart sounds.  Pulmonary:     Effort: Pulmonary effort is normal. No respiratory distress.     Breath sounds: Normal breath sounds. No stridor. No wheezing or rhonchi.  Abdominal:     General: Abdomen is flat. Bowel sounds are normal. There is no distension.     Palpations: Abdomen is soft. There is no mass.     Tenderness: There is no abdominal tenderness. There is no guarding or rebound.     Hernia: No hernia is present.  Musculoskeletal:     Cervical back: No edema, erythema, signs of trauma, rigidity or crepitus. Pain with movement and spinous process tenderness  present. Decreased range of motion.  Neurological:     Mental Status: She is alert.          Assessment & Plan:  Neck pain - Plan: DG Cervical Spine Complete I believe this is most likely degenerative disc disease in the  cervical spine with perhaps facet joint arthritis.  I recommended obtaining an x-ray of the cervical spine to evaluate further.  In the meantime I recommended that she stop all over-the-counter NSAIDs and try meloxicam 15 mg a day and then reassess after 1 to 2 weeks to see if it is improving

## 2020-12-12 ENCOUNTER — Ambulatory Visit: Payer: Self-pay | Admitting: Family Medicine

## 2020-12-14 DIAGNOSIS — H353131 Nonexudative age-related macular degeneration, bilateral, early dry stage: Secondary | ICD-10-CM | POA: Diagnosis not present

## 2020-12-15 ENCOUNTER — Telehealth: Payer: Self-pay | Admitting: *Deleted

## 2020-12-15 ENCOUNTER — Telehealth: Payer: Self-pay | Admitting: Family Medicine

## 2020-12-15 DIAGNOSIS — R293 Abnormal posture: Secondary | ICD-10-CM | POA: Diagnosis not present

## 2020-12-15 DIAGNOSIS — M542 Cervicalgia: Secondary | ICD-10-CM | POA: Diagnosis not present

## 2020-12-15 DIAGNOSIS — G4486 Cervicogenic headache: Secondary | ICD-10-CM | POA: Diagnosis not present

## 2020-12-15 NOTE — Telephone Encounter (Signed)
Received call from patient.   Reports that she has been taking Mobic QD for neck pain. States that it does help a bit, but her pain is still there.   Inquired as to next steps.   Please advise.

## 2020-12-15 NOTE — Telephone Encounter (Signed)
Patient called to schedule appt for Prolia injection. Spoke with nurse Anitra to have order placed. Informed patient she will be called for scheduling when injection arrives; patient would like to have injection administered on 8/22 (same day as lab appt).   Please advise at [phone] for scheduling when injection arrives.

## 2020-12-15 NOTE — Telephone Encounter (Addendum)
Patient is due for injection after  12/20/2020.

## 2020-12-16 NOTE — Telephone Encounter (Signed)
Call placed to patient and patient made aware.  

## 2020-12-16 NOTE — Telephone Encounter (Signed)
Patient would like to proceed with MRI.   She is unable to to schedule on 12/20/2020.

## 2020-12-19 ENCOUNTER — Other Ambulatory Visit: Payer: Self-pay | Admitting: Family Medicine

## 2020-12-19 DIAGNOSIS — M5030 Other cervical disc degeneration, unspecified cervical region: Secondary | ICD-10-CM

## 2020-12-20 DIAGNOSIS — H6121 Impacted cerumen, right ear: Secondary | ICD-10-CM | POA: Diagnosis not present

## 2020-12-20 DIAGNOSIS — L299 Pruritus, unspecified: Secondary | ICD-10-CM | POA: Diagnosis not present

## 2020-12-28 ENCOUNTER — Other Ambulatory Visit: Payer: Self-pay

## 2020-12-28 ENCOUNTER — Ambulatory Visit
Admission: RE | Admit: 2020-12-28 | Discharge: 2020-12-28 | Disposition: A | Discharge status: Home / Self Care | Payer: PPO | Source: Ambulatory Visit | Attending: Family Medicine | Admitting: Family Medicine

## 2020-12-28 DIAGNOSIS — M5030 Other cervical disc degeneration, unspecified cervical region: Secondary | ICD-10-CM | POA: Diagnosis not present

## 2020-12-28 DIAGNOSIS — M542 Cervicalgia: Secondary | ICD-10-CM | POA: Diagnosis not present

## 2020-12-29 ENCOUNTER — Encounter: Payer: Self-pay | Admitting: *Deleted

## 2020-12-30 ENCOUNTER — Other Ambulatory Visit: Payer: Self-pay | Admitting: Family Medicine

## 2020-12-30 DIAGNOSIS — M5030 Other cervical disc degeneration, unspecified cervical region: Secondary | ICD-10-CM

## 2021-01-02 ENCOUNTER — Other Ambulatory Visit: Payer: Self-pay

## 2021-01-02 ENCOUNTER — Ambulatory Visit (INDEPENDENT_AMBULATORY_CARE_PROVIDER_SITE_OTHER): Payer: PPO | Admitting: Family Medicine

## 2021-01-02 ENCOUNTER — Encounter: Payer: Self-pay | Admitting: Family Medicine

## 2021-01-02 VITALS — BP 128/74 | HR 96 | Temp 97.9°F | Resp 16 | Ht 63.0 in | Wt 103.0 lb

## 2021-01-02 DIAGNOSIS — R300 Dysuria: Secondary | ICD-10-CM

## 2021-01-02 LAB — URINALYSIS, ROUTINE W REFLEX MICROSCOPIC
Bilirubin Urine: NEGATIVE
Glucose, UA: NEGATIVE
Hyaline Cast: NONE SEEN /LPF
Ketones, ur: NEGATIVE
Nitrite: NEGATIVE
Specific Gravity, Urine: 1.015 (ref 1.001–1.035)
WBC, UA: >=60 /HPF — AB (ref 0–5)
pH: 7 (ref 5.0–8.0)

## 2021-01-02 LAB — MICROSCOPIC MESSAGE

## 2021-01-02 MED ORDER — SULFAMETHOXAZOLE-TRIMETHOPRIM 800-160 MG PO TABS: 1.0000 | ORAL_TABLET | Freq: Two times a day (BID) | ORAL | 0 refills | Status: DC

## 2021-01-02 NOTE — Progress Notes (Signed)
Subjective:    Patient ID: Joanne Gomez, female    DOB: [birth year], 87 y.o.   MRN: [id number] Patient reports 3 days of dysuria, urgency, and frequency.  Urinalysis shows +3 leukocyte esterase as well as +1 blood Past Medical History:  Diagnosis Date   Cancer ([place]) 11/2007   colon stage II   Diverticula of colon    GERD (gastroesophageal reflux disease)    H/O colon cancer, stage II 11/15/2011   HH (hiatus hernia)    History of hiatal hernia    HOH (hard of hearing)    MILD LOSS   Hyperlipidemia    Hypothyroidism    Osteoporosis    Pneumonia    Past Surgical History:  Procedure Laterality Date   ABDOMINAL HYSTERECTOMY     APPENDECTOMY     CATARACT EXTRACTION W/PHACO Right 08/30/2015   Procedure: CATARACT EXTRACTION PHACO AND INTRAOCULAR LENS PLACEMENT ([place]);  Surgeon: Birder Robson, MD;  Location: [facility] ORS;  Service: Ophthalmology;  Laterality: Right;   [place]    00:40.8 AP%  15.7 CDE    6.42   fluid pack lot [id number]  exp 02/24/2017   CATARACT EXTRACTION W/PHACO Left 09/20/2015   Procedure: CATARACT EXTRACTION PHACO AND INTRAOCULAR LENS PLACEMENT ([facility]);  Surgeon: Birder Robson, MD;  Location: [facility] OR[facility];  Service: Ophthalmology;  Laterality: Left;  [place] 44.8 AP% 19.6 CDE 8.77 FLUID PACK LOT # [id number]   CHOLECYSTECTOMY     ELBOW SURGERY     right    hemocolectomy  2009   VAGINAL PROLAPSE REPAIR     Current Outpatient Medications on File Prior to Visit  Medication Sig Dispense Refill   amLODipine (NORVASC) 2.5 MG tablet Take 1 tablet (2.5 mg total) by mouth daily. 90 tablet 3   CALCIUM CITRATE PO Take 1 tablet by mouth 2 (two) times daily. '630mg'$      Cholecalciferol (VITAMIN D3) 2000 units TABS Take 1 tablet by mouth daily.     esomeprazole (NEXIUM) 40 MG capsule Take 40 mg by mouth daily as needed.     Fluocinolone Acetonide 0.01 % OIL Place 3 drops in ear(s) 2 (two) times daily as needed. 20 mL 0   Magnesium 250 MG TABS Take 1 tablet by mouth daily.     meloxicam  (MOBIC) 15 MG tablet Take 1 tablet (15 mg total) by mouth daily. 30 tablet 0   Multiple Vitamin (MULTIVITAMIN) tablet Take 1 tablet by mouth daily.     nystatin ointment (MYCOSTATIN) Apply 1 application topically 2 (two) times daily as needed.     thyroid (ARMOUR THYROID) 60 MG tablet Take (1.5) tabs 4x weekly, and take (1) tab 3x weekly 135 tablet 1   vitamin B-12 (CYANOCOBALAMIN) 1000 MCG tablet Take 5,000 mcg by mouth daily.     vitamin E 100 UNIT capsule Take 200 Units by mouth daily.     Zoster Vaccine Adjuvanted [facility]) injection Repeat in 2 to 6 months 0.5 mL 1   No current facility-administered medications on file prior to visit.   No Known Allergies Social History   Socioeconomic History   Marital status: Married    Spouse name: Not on file   Number of children: Not on file   Years of education: Not on file   Highest education level: Not on file  Occupational History   Not on file  Tobacco Use   Smoking status: Never   Smokeless tobacco: Never  Vaping Use   Vaping Use: Never used  Substance and Sexual  Activity   Alcohol use: Yes    Comment: occ   Drug use: No   Sexual activity: Yes    Comment: Married to [place], retired.  Other Topics Concern   Not on file  Social History Narrative   Not on file   Social Determinants of Health   Financial Resource Strain: Low Risk    Difficulty of Paying Living Expenses: Not hard at all  Food Insecurity: No Food Insecurity   Worried About Charity fundraiser in the Last Year: Never true   [place] in the Last Year: Never true  Transportation Needs: No Transportation Needs   Lack of Transportation (Medical): No   Lack of Transportation (Non-Medical): No  Physical Activity: Insufficiently Active   Days of Exercise per Week: 3 days   Minutes of Exercise per Session: 20 min  Stress: No Stress Concern Present   Feeling of Stress : Not at all  Social Connections: Moderately Integrated   Frequency of Communication with  Friends and Family: More than three times a week   Frequency of Social Gatherings with Friends and Family: Three times a week   Attends Religious Services: 1 to 4 times per year   Active Member of Clubs or Organizations: No   Attends Archivist Meetings: Never   Marital Status: Married  Human resources officer Violence: Not At Risk   Fear of Current or Ex-Partner: No   Emotionally Abused: No   Physically Abused: No   Sexually Abused: No      Review of Systems  All other systems reviewed and are negative.     Objective:    Regular rate and rhythm no murmurs rubs or gallops.  Lungs are clear to auscultation bilaterally with no wheezes crackles or rails.  Abdomen is soft nontender nondistended with normal bowel sounds.  Vital signs are reviewed      Assessment & Plan:  Dysuria - Plan: Urinalysis, Routine w reflex microscopic, Urine Culture Appears to have UTI, Bactrim double strength tablets twice daily for 5 days

## 2021-01-02 NOTE — Addendum Note (Signed)
Addended by: Sheral Flow on: 01/02/2021 11:03 AM   Modules accepted: Orders

## 2021-01-04 LAB — URINE CULTURE
MICRO NUMBER:: 12213450
SPECIMEN QUALITY:: ADEQUATE

## 2021-01-09 ENCOUNTER — Telehealth: Payer: Self-pay | Admitting: *Deleted

## 2021-01-09 ENCOUNTER — Other Ambulatory Visit: Payer: Self-pay | Admitting: Family Medicine

## 2021-01-09 MED ORDER — NITROFURANTOIN MONOHYD MACRO 100 MG PO CAPS: 100.0000 mg | ORAL_CAPSULE | Freq: Two times a day (BID) | ORAL | 0 refills | Status: DC

## 2021-01-09 NOTE — Telephone Encounter (Signed)
Call placed to patient and patient made aware.  

## 2021-01-09 NOTE — Telephone Encounter (Signed)
Received call from patient.   Reports that she has completed Bactrim for UTI, but Sx have not completely resolved.   Per labs, UTI is sensitive to Macrobid.   Please advise.

## 2021-01-16 ENCOUNTER — Telehealth: Payer: Self-pay | Admitting: Family Medicine

## 2021-01-16 ENCOUNTER — Other Ambulatory Visit: Payer: Self-pay

## 2021-01-16 ENCOUNTER — Other Ambulatory Visit: Payer: PPO

## 2021-01-16 DIAGNOSIS — E039 Hypothyroidism, unspecified: Secondary | ICD-10-CM | POA: Diagnosis not present

## 2021-01-16 DIAGNOSIS — R399 Unspecified symptoms and signs involving the genitourinary system: Secondary | ICD-10-CM | POA: Diagnosis not present

## 2021-01-16 LAB — TSH: TSH: 0.78 mIU/L (ref 0.40–4.50)

## 2021-01-16 NOTE — Telephone Encounter (Signed)
Patient wants to wait until the first of the year to receive her Prolia injection; patient wants to know if flu shot and prolia injections need to be spaced out. Please advise at [phone].

## 2021-01-17 LAB — URINALYSIS, ROUTINE W REFLEX MICROSCOPIC
Bacteria, UA: NONE SEEN /HPF
Bilirubin Urine: NEGATIVE
Glucose, UA: NEGATIVE
Hgb urine dipstick: NEGATIVE
Hyaline Cast: NONE SEEN /LPF
Ketones, ur: NEGATIVE
Nitrite: NEGATIVE
Protein, ur: NEGATIVE
Specific Gravity, Urine: 1.007 (ref 1.001–1.035)
Squamous Epithelial / HPF: NONE SEEN /HPF (ref <=5)
WBC, UA: NONE SEEN /HPF (ref 0–5)
pH: 7.5 (ref 5.0–8.0)

## 2021-01-17 LAB — MICROSCOPIC MESSAGE

## 2021-01-17 LAB — URINE CULTURE
MICRO NUMBER:: 12273131
Result:: NO GROWTH
SPECIMEN QUALITY:: ADEQUATE

## 2021-01-18 NOTE — Telephone Encounter (Signed)
Call placed to patient.   Patient reports that she would like to push Prolia to beginning of 2023.  Also inquired about flu shots. Advised that we have not received high dose flu shots at this time. Will call patient once in stock.

## 2021-01-23 DIAGNOSIS — R293 Abnormal posture: Secondary | ICD-10-CM | POA: Diagnosis not present

## 2021-01-23 DIAGNOSIS — M542 Cervicalgia: Secondary | ICD-10-CM | POA: Diagnosis not present

## 2021-01-23 DIAGNOSIS — G4486 Cervicogenic headache: Secondary | ICD-10-CM | POA: Diagnosis not present

## 2021-02-10 ENCOUNTER — Ambulatory Visit (INDEPENDENT_AMBULATORY_CARE_PROVIDER_SITE_OTHER): Payer: PPO | Admitting: *Deleted

## 2021-02-10 ENCOUNTER — Telehealth: Payer: Self-pay

## 2021-02-10 ENCOUNTER — Other Ambulatory Visit: Payer: Self-pay

## 2021-02-10 DIAGNOSIS — Z23 Encounter for immunization: Secondary | ICD-10-CM | POA: Diagnosis not present

## 2021-02-14 DIAGNOSIS — D485 Neoplasm of uncertain behavior of skin: Secondary | ICD-10-CM | POA: Diagnosis not present

## 2021-02-14 DIAGNOSIS — D2261 Melanocytic nevi of right upper limb, including shoulder: Secondary | ICD-10-CM | POA: Diagnosis not present

## 2021-02-14 DIAGNOSIS — D2271 Melanocytic nevi of right lower limb, including hip: Secondary | ICD-10-CM | POA: Diagnosis not present

## 2021-02-14 DIAGNOSIS — D2262 Melanocytic nevi of left upper limb, including shoulder: Secondary | ICD-10-CM | POA: Diagnosis not present

## 2021-02-14 DIAGNOSIS — Z85828 Personal history of other malignant neoplasm of skin: Secondary | ICD-10-CM | POA: Diagnosis not present

## 2021-02-14 DIAGNOSIS — B078 Other viral warts: Secondary | ICD-10-CM | POA: Diagnosis not present

## 2021-02-14 DIAGNOSIS — L538 Other specified erythematous conditions: Secondary | ICD-10-CM | POA: Diagnosis not present

## 2021-02-27 DIAGNOSIS — M5030 Other cervical disc degeneration, unspecified cervical region: Secondary | ICD-10-CM | POA: Diagnosis not present

## 2021-02-28 DIAGNOSIS — G4486 Cervicogenic headache: Secondary | ICD-10-CM | POA: Diagnosis not present

## 2021-02-28 DIAGNOSIS — M5030 Other cervical disc degeneration, unspecified cervical region: Secondary | ICD-10-CM | POA: Diagnosis not present

## 2021-02-28 DIAGNOSIS — M542 Cervicalgia: Secondary | ICD-10-CM | POA: Diagnosis not present

## 2021-02-28 DIAGNOSIS — R293 Abnormal posture: Secondary | ICD-10-CM | POA: Diagnosis not present

## 2021-03-02 DIAGNOSIS — M542 Cervicalgia: Secondary | ICD-10-CM | POA: Diagnosis not present

## 2021-03-02 DIAGNOSIS — M5030 Other cervical disc degeneration, unspecified cervical region: Secondary | ICD-10-CM | POA: Diagnosis not present

## 2021-03-02 DIAGNOSIS — R293 Abnormal posture: Secondary | ICD-10-CM | POA: Diagnosis not present

## 2021-03-02 DIAGNOSIS — G4486 Cervicogenic headache: Secondary | ICD-10-CM | POA: Diagnosis not present

## 2021-03-07 DIAGNOSIS — G4486 Cervicogenic headache: Secondary | ICD-10-CM | POA: Diagnosis not present

## 2021-03-07 DIAGNOSIS — M542 Cervicalgia: Secondary | ICD-10-CM | POA: Diagnosis not present

## 2021-03-07 DIAGNOSIS — M5030 Other cervical disc degeneration, unspecified cervical region: Secondary | ICD-10-CM | POA: Diagnosis not present

## 2021-03-07 DIAGNOSIS — R293 Abnormal posture: Secondary | ICD-10-CM | POA: Diagnosis not present

## 2021-03-09 DIAGNOSIS — R293 Abnormal posture: Secondary | ICD-10-CM | POA: Diagnosis not present

## 2021-03-09 DIAGNOSIS — M542 Cervicalgia: Secondary | ICD-10-CM | POA: Diagnosis not present

## 2021-03-09 DIAGNOSIS — M5030 Other cervical disc degeneration, unspecified cervical region: Secondary | ICD-10-CM | POA: Diagnosis not present

## 2021-03-09 DIAGNOSIS — G4486 Cervicogenic headache: Secondary | ICD-10-CM | POA: Diagnosis not present

## 2021-03-14 DIAGNOSIS — G4486 Cervicogenic headache: Secondary | ICD-10-CM | POA: Diagnosis not present

## 2021-03-14 DIAGNOSIS — M542 Cervicalgia: Secondary | ICD-10-CM | POA: Diagnosis not present

## 2021-03-14 DIAGNOSIS — R293 Abnormal posture: Secondary | ICD-10-CM | POA: Diagnosis not present

## 2021-03-14 DIAGNOSIS — M5030 Other cervical disc degeneration, unspecified cervical region: Secondary | ICD-10-CM | POA: Diagnosis not present

## 2021-03-16 DIAGNOSIS — M5030 Other cervical disc degeneration, unspecified cervical region: Secondary | ICD-10-CM | POA: Diagnosis not present

## 2021-03-16 DIAGNOSIS — G4486 Cervicogenic headache: Secondary | ICD-10-CM | POA: Diagnosis not present

## 2021-03-16 DIAGNOSIS — M542 Cervicalgia: Secondary | ICD-10-CM | POA: Diagnosis not present

## 2021-03-16 DIAGNOSIS — R293 Abnormal posture: Secondary | ICD-10-CM | POA: Diagnosis not present

## 2021-03-21 DIAGNOSIS — G4486 Cervicogenic headache: Secondary | ICD-10-CM | POA: Diagnosis not present

## 2021-03-21 DIAGNOSIS — R293 Abnormal posture: Secondary | ICD-10-CM | POA: Diagnosis not present

## 2021-03-21 DIAGNOSIS — M5030 Other cervical disc degeneration, unspecified cervical region: Secondary | ICD-10-CM | POA: Diagnosis not present

## 2021-03-21 DIAGNOSIS — M542 Cervicalgia: Secondary | ICD-10-CM | POA: Diagnosis not present

## 2021-03-23 DIAGNOSIS — R293 Abnormal posture: Secondary | ICD-10-CM | POA: Diagnosis not present

## 2021-03-23 DIAGNOSIS — M5030 Other cervical disc degeneration, unspecified cervical region: Secondary | ICD-10-CM | POA: Diagnosis not present

## 2021-03-23 DIAGNOSIS — M542 Cervicalgia: Secondary | ICD-10-CM | POA: Diagnosis not present

## 2021-03-23 DIAGNOSIS — G4486 Cervicogenic headache: Secondary | ICD-10-CM | POA: Diagnosis not present

## 2021-03-28 DIAGNOSIS — M542 Cervicalgia: Secondary | ICD-10-CM | POA: Diagnosis not present

## 2021-03-28 DIAGNOSIS — M5030 Other cervical disc degeneration, unspecified cervical region: Secondary | ICD-10-CM | POA: Diagnosis not present

## 2021-03-28 DIAGNOSIS — G4486 Cervicogenic headache: Secondary | ICD-10-CM | POA: Diagnosis not present

## 2021-03-28 DIAGNOSIS — R293 Abnormal posture: Secondary | ICD-10-CM | POA: Diagnosis not present

## 2021-03-30 DIAGNOSIS — M542 Cervicalgia: Secondary | ICD-10-CM | POA: Diagnosis not present

## 2021-03-30 DIAGNOSIS — G4486 Cervicogenic headache: Secondary | ICD-10-CM | POA: Diagnosis not present

## 2021-03-30 DIAGNOSIS — M5030 Other cervical disc degeneration, unspecified cervical region: Secondary | ICD-10-CM | POA: Diagnosis not present

## 2021-03-30 DIAGNOSIS — R293 Abnormal posture: Secondary | ICD-10-CM | POA: Diagnosis not present

## 2021-04-05 ENCOUNTER — Telehealth: Payer: Self-pay | Admitting: *Deleted

## 2021-04-05 NOTE — Telephone Encounter (Signed)
Received call from patient.   Reports that she has had x1 day of increased sinus pressure in nasal bridge. Denies cough, congestion, nasal drainage, HA, ear ache, fever, etc.   Advised to use nasal saline and flonase. Advised to contact office if Sx worsen or persist >1 week.

## 2021-04-12 DIAGNOSIS — R293 Abnormal posture: Secondary | ICD-10-CM | POA: Diagnosis not present

## 2021-04-12 DIAGNOSIS — G4486 Cervicogenic headache: Secondary | ICD-10-CM | POA: Diagnosis not present

## 2021-04-12 DIAGNOSIS — M542 Cervicalgia: Secondary | ICD-10-CM | POA: Diagnosis not present

## 2021-04-12 DIAGNOSIS — M5030 Other cervical disc degeneration, unspecified cervical region: Secondary | ICD-10-CM | POA: Diagnosis not present

## 2021-04-17 DIAGNOSIS — G4486 Cervicogenic headache: Secondary | ICD-10-CM | POA: Diagnosis not present

## 2021-04-17 DIAGNOSIS — M5030 Other cervical disc degeneration, unspecified cervical region: Secondary | ICD-10-CM | POA: Diagnosis not present

## 2021-04-17 DIAGNOSIS — R293 Abnormal posture: Secondary | ICD-10-CM | POA: Diagnosis not present

## 2021-04-17 DIAGNOSIS — M542 Cervicalgia: Secondary | ICD-10-CM | POA: Diagnosis not present

## 2021-04-24 DIAGNOSIS — M542 Cervicalgia: Secondary | ICD-10-CM | POA: Diagnosis not present

## 2021-04-24 DIAGNOSIS — M5030 Other cervical disc degeneration, unspecified cervical region: Secondary | ICD-10-CM | POA: Diagnosis not present

## 2021-04-24 DIAGNOSIS — G4486 Cervicogenic headache: Secondary | ICD-10-CM | POA: Diagnosis not present

## 2021-04-24 DIAGNOSIS — R293 Abnormal posture: Secondary | ICD-10-CM | POA: Diagnosis not present

## 2021-04-27 DIAGNOSIS — R293 Abnormal posture: Secondary | ICD-10-CM | POA: Diagnosis not present

## 2021-04-27 DIAGNOSIS — M542 Cervicalgia: Secondary | ICD-10-CM | POA: Diagnosis not present

## 2021-04-27 DIAGNOSIS — G4486 Cervicogenic headache: Secondary | ICD-10-CM | POA: Diagnosis not present

## 2021-04-27 DIAGNOSIS — M5030 Other cervical disc degeneration, unspecified cervical region: Secondary | ICD-10-CM | POA: Diagnosis not present

## 2021-05-02 ENCOUNTER — Telehealth: Payer: Self-pay | Admitting: Pharmacist

## 2021-05-02 NOTE — Progress Notes (Addendum)
Chronic Care Management Pharmacy Assistant   Name: Joanne Gomez  MRN: [id number] DOB: 08-20-1930   Reason for Encounter: Disease State - General Adherence Call     Recent office visits:  01/02/21 Jenna Luo, MD (PCP) - Family Medicine - Dysuria - labs were ordered. sulfamethoxazole-trimethoprim (BACTRIM DS) 800-160 MG tablet Take 1 tablet by mouth 2 (two) times daily. Follow up if needed.   12/09/20 Jenna Luo, MD - Neck pain - XR C spine ordered. meloxicam (MOBIC) 15 MG tablet Take 1 tablet (15 mg total) by mouth daily. Follow up in 1-2 weeks.   11/15/20 Jenna Luo, MD - Hypothyroidism - Recommended alternating 60 mg with 90 mg every other day of Armour Thyroid.  Therefore she will take 90 mg of Armour Thyroid on Monday, Wednesday, Friday which is 1-1/2 tablets.  She will take 1 tablet or 60 mg on Tuesday, Thursday, Saturday, Sunday and recheck TSH in 6 to 8 weeks.    Recent consult visits:  02/14/21 Kirkland Hun - Dermatology - Melanocytic nevi of left upper limb - No notes available.  01/23/21 Rosaura Carpenter - Physical Therapy - No notes available.   12/20/20 Steele Sizer - Otolaryngology - No notes available.  12/15/20 Rosaura Carpenter - Physical Therapy - No notes available.  12/14/20 Birder Robson - Ophthalmology - No notes available.  12/08/20 Rosaura Carpenter - Physical Therapy - No notes available.   12/06/20 Rosaura Carpenter - Physical Therapy - No notes available.   12/01/20 Rosaura Carpenter - Physical Therapy - No notes available.  11/29/20 Rosaura Carpenter - Physical Therapy - No notes available.  11/25/20 Rosaura Carpenter - Physical Therapy - No notes available.  11/23/20 Rosaura Carpenter - Physical Therapy - No notes available.  11/21/20 Rosaura Carpenter - Physical Therapy - No notes available.   Hospital visits:  None in previous 6 months  Medications: Outpatient Encounter Medications as of 05/02/2021  Medication Sig   amLODipine (NORVASC) 2.5 MG tablet Take 1 tablet (2.5 mg  total) by mouth daily.   CALCIUM CITRATE PO Take 1 tablet by mouth 2 (two) times daily. 630mg    Cholecalciferol (VITAMIN D3) 2000 units TABS Take 1 tablet by mouth daily.   esomeprazole (NEXIUM) 40 MG capsule Take 40 mg by mouth daily as needed.   Fluocinolone Acetonide 0.01 % OIL Place 3 drops in ear(s) 2 (two) times daily as needed.   Magnesium 250 MG TABS Take 1 tablet by mouth daily.   meloxicam (MOBIC) 15 MG tablet Take 1 tablet (15 mg total) by mouth daily.   Multiple Vitamin (MULTIVITAMIN) tablet Take 1 tablet by mouth daily.   nitrofurantoin, macrocrystal-monohydrate, (MACROBID) 100 MG capsule Take 1 capsule (100 mg total) by mouth 2 (two) times daily.   nystatin ointment (MYCOSTATIN) Apply 1 application topically 2 (two) times daily as needed.   sulfamethoxazole-trimethoprim (BACTRIM DS) 800-160 MG tablet Take 1 tablet by mouth 2 (two) times daily.   thyroid (ARMOUR THYROID) 60 MG tablet Take (1.5) tabs 4x weekly, and take (1) tab 3x weekly   vitamin B-12 (CYANOCOBALAMIN) 1000 MCG tablet Take 5,000 mcg by mouth daily.   vitamin E 100 UNIT capsule Take 200 Units by mouth daily.   No facility-administered encounter medications on file as of 05/02/2021.    Have you had any problems recently with your health? Patient denied any new problems with health currently.   Have you had any problems with your pharmacy? She denied any problems with her current pharmacy.   What  issues or side effects are you having with your medications? Patient denied any issues or side effects with her current medications.   What would you like me to pass along to Leata Mouse, CPP for them to help you with?  Patient did not have anything to pass along to CPP at this time.   What can we do to take care of you better? Patient did not have any recommendations at this time.  Care Gaps  AWV: done 09/19/20 Colonoscopy: done 04/26/14 DM Eye Exam: N/A DM Foot Exam: N/A Microalbumin: N/A HbgAIC: N/A DEXA:  done 09/01/19 Mammogram: done 11/02/20  Star Rating Drugs: No Star Rating Drugs Noted  Future Appointments  Date Time Provider [place]  06/01/2021 10:30 AM [facility] PHARMACIST [facility] None   Jobe Gibbon, [place] Clinical Pharmacist Assistant  [phone]

## 2021-05-04 DIAGNOSIS — M5030 Other cervical disc degeneration, unspecified cervical region: Secondary | ICD-10-CM | POA: Diagnosis not present

## 2021-05-04 DIAGNOSIS — R293 Abnormal posture: Secondary | ICD-10-CM | POA: Diagnosis not present

## 2021-05-04 DIAGNOSIS — M542 Cervicalgia: Secondary | ICD-10-CM | POA: Diagnosis not present

## 2021-05-04 DIAGNOSIS — G4486 Cervicogenic headache: Secondary | ICD-10-CM | POA: Diagnosis not present

## 2021-05-09 DIAGNOSIS — R293 Abnormal posture: Secondary | ICD-10-CM | POA: Diagnosis not present

## 2021-05-09 DIAGNOSIS — G4486 Cervicogenic headache: Secondary | ICD-10-CM | POA: Diagnosis not present

## 2021-05-09 DIAGNOSIS — M542 Cervicalgia: Secondary | ICD-10-CM | POA: Diagnosis not present

## 2021-05-09 DIAGNOSIS — M5030 Other cervical disc degeneration, unspecified cervical region: Secondary | ICD-10-CM | POA: Diagnosis not present

## 2021-05-15 DIAGNOSIS — R293 Abnormal posture: Secondary | ICD-10-CM | POA: Diagnosis not present

## 2021-05-15 DIAGNOSIS — M542 Cervicalgia: Secondary | ICD-10-CM | POA: Diagnosis not present

## 2021-05-15 DIAGNOSIS — G4486 Cervicogenic headache: Secondary | ICD-10-CM | POA: Diagnosis not present

## 2021-05-15 DIAGNOSIS — M5030 Other cervical disc degeneration, unspecified cervical region: Secondary | ICD-10-CM | POA: Diagnosis not present

## 2021-05-18 ENCOUNTER — Telehealth: Payer: PPO

## 2021-05-23 ENCOUNTER — Other Ambulatory Visit: Payer: Self-pay | Admitting: Family Medicine

## 2021-05-23 ENCOUNTER — Other Ambulatory Visit: Payer: Self-pay

## 2021-05-23 ENCOUNTER — Telehealth: Payer: Self-pay

## 2021-05-23 MED ORDER — NIRMATRELVIR/RITONAVIR (PAXLOVID)TABLET: 3.0000 | ORAL_TABLET | Freq: Two times a day (BID) | ORAL | 0 refills | Status: AC

## 2021-05-23 NOTE — Telephone Encounter (Signed)
Spoke with pt and notified of rx. Also advised her to contact us if not improving, or develops f/c, shob or wheeze. Pt voiced understanding. Nothing further needed at this time.

## 2021-05-23 NOTE — Telephone Encounter (Signed)
Pt called to advise she has tested POSITIVE for COVID. She reports symptoms started this past Friday, she tested positive today. She currently has fatigue, dry cough, nasal congestion. She denies fever, chills, shob or wheeze. She has taken OTC cough medication and Aleve for her symptoms, these have helped. Pt would like to know if you would recommend anything else.  Please advise, thanks!

## 2021-05-30 NOTE — Progress Notes (Signed)
Chronic Care Management Pharmacy Note  06/02/2021 Name:  Joanne Gomez MRN:  [id number] DOB:  11-Dec-1930  Subjective: Joanne Gomez is an 88 y.o. year old female who is a primary patient of Pickard, Cammie Mcgee, MD.  The CCM team was consulted for assistance with disease management and care coordination needs.    Engaged with patient by telephone for follow up visit in response to provider referral for pharmacy case management and/or care coordination services.   Consent to Services:  The patient was given the following information about Chronic Care Management services today, agreed to services, and gave verbal consent: 1. CCM service includes personalized support from designated clinical staff supervised by the primary care provider, including individualized plan of care and coordination with other care providers 2. 24/7 contact phone numbers for assistance for urgent and routine care needs. 3. Service will only be billed when office clinical staff spend 20 minutes or more in a month to coordinate care. 4. Only one practitioner may furnish and bill the service in a calendar month. 5.The patient may stop CCM services at any time (effective at the end of the month) by phone call to the office staff. 6. The patient will be responsible for cost sharing (co-pay) of up to 20% of the service fee (after annual deductible is met). Patient agreed to services and consent obtained.  Patient Care Team: Susy Frizzle, MD as PCP - General (Family Medicine) Edythe Clarity, [facility] as Pharmacist (Pharmacist)  Recent office visits: 02/16/2020 - Patient hesitant on Prolia shot, no medication changes made  Recent consult visits: None since last CCM visit  Hospital visits: None in previous 6 months  Objective:  Lab Results  Component Value Date   CREATININE 0.69 10/28/2020   BUN 13 10/28/2020   GFRNONAA 78 10/28/2020   GFRAA 91 10/28/2020   NA 137 10/28/2020   K 5.2 10/28/2020   CALCIUM 9.8  10/28/2020   CO2 29 10/28/2020    No results found for: HGBA1C, FRUCTOSAMINE, GFR, MICROALBUR  Last diabetic Eye exam: No results found for: HMDIABEYEEXA  Last diabetic Foot exam: No results found for: HMDIABFOOTEX   Lab Results  Component Value Date   CHOL 190 08/10/2020   HDL 82 08/10/2020   LDLCALC 91 08/10/2020   TRIG 79 08/10/2020   CHOLHDL 2.3 08/10/2020    Hepatic Function Latest Ref Rng & Units 10/28/2020 08/10/2020 08/17/2019  Total Protein 6.1 - 8.1 g/dL 7.3 7.5 7.5  Albumin 3.6 - 5.1 g/dL - - -  AST 10 - 35 U/L 24 21 20   ALT 6 - 29 U/L 16 16 13   Alk Phosphatase 33 - 130 U/L - - -  Total Bilirubin 0.2 - 1.2 mg/dL 0.4 0.5 0.5  Bilirubin, Direct 0.0 - 0.3 mg/dL - - -    Lab Results  Component Value Date/Time   TSH 0.78 01/16/2021 11:28 AM   TSH 5.12 (H) 11/08/2020 08:37 AM   FREET4 0.87 10/01/2012 11:50 AM    CBC Latest Ref Rng & Units 10/28/2020 08/10/2020 08/17/2019  WBC 3.8 - 10.8 Thousand/uL 7.5 7.3 6.4  Hemoglobin 11.7 - 15.5 g/dL 12.9 13.5 12.8  Hematocrit 35.0 - 45.0 % 39.6 40.3 39.8  Platelets 140 - 400 Thousand/uL 220 252 230    Lab Results  Component Value Date/Time   VD25OH 36 01/08/2017 08:24 AM   VD25OH 44 07/05/2016 09:05 AM    Clinical ASCVD: No  The ASCVD Risk score (Arnett DK, et al., 2019)  failed to calculate for the following reasons:   The 2019 ASCVD risk score is only valid for ages 99 to 62    Depression screen PHQ 2/9 12/01/2020 11/15/2020 08/21/2019  Decreased Interest 0 0 0  Down, Depressed, Hopeless 0 0 0  PHQ - 2 Score 0 0 0  Altered sleeping - - -  Tired, decreased energy - - -  Change in appetite - - -  Feeling bad or failure about yourself  - - -  Trouble concentrating - - -  Moving slowly or fidgety/restless - - -  Suicidal thoughts - - -  PHQ-9 Score - - -  Difficult doing work/chores - - -     Social History   Tobacco Use  Smoking Status Never  Smokeless Tobacco Never   BP Readings from Last 3 Encounters:  01/02/21  128/74  12/09/20 134/78  12/01/20 110/70   Pulse Readings from Last 3 Encounters:  01/02/21 96  12/09/20 80  11/15/20 80   Wt Readings from Last 3 Encounters:  01/02/21 103 lb (46.7 kg)  12/09/20 103 lb 12.8 oz (47.1 kg)  12/01/20 104 lb (47.2 kg)    Assessment/Interventions: Review of patient past medical history, allergies, medications, health status, including review of consultants reports, laboratory and other test data, was performed as part of comprehensive evaluation and provision of chronic care management services.   SDOH:  (Social Determinants of Health) assessments and interventions performed: No   CCM Care Plan  No Known Allergies  Medications Reviewed Today     Reviewed by Edythe Clarity, [facility] (Pharmacist) on 06/02/21 at 1317  Med List Status: <None>   Medication Order Taking? Sig Documenting Provider Last Dose Status Informant  amLODipine (NORVASC) 2.5 MG tablet [id number] Yes Take 1 tablet (2.5 mg total) by mouth daily. Minna Merritts, MD Taking Active   CALCIUM CITRATE PO [id number] Yes Take 1 tablet by mouth 2 (two) times daily. 610m [provider] Taking Active   Cholecalciferol (VITAMIN D3) 2000 units TABS [id number] Take 1 tablet by mouth daily. [provider] Taking Active   cycloSPORINE (RESTASIS) 0.05 % ophthalmic emulsion [id number] Restasis MultiDose 0.05 % eye drops [provider] Taking Active   esomeprazole (NEXIUM) 40 MG capsule [id number] Take 40 mg by mouth daily as needed. [provider] Taking Active   Fluocinolone Acetonide 0.01 % OIL [id number] Place 3 drops in ear(s) 2 (two) times daily as needed. PSusy Frizzle MD Taking Active   Lifitegrast (Shirley Friar 5 % SBailey [id number] Xiidra 5 % eye drops in a dropperette [provider] Taking Active   Magnesium 250 MG TABS [id number] Take 1 tablet by mouth daily. [provider] Taking Active   meloxicam (MOBIC) 15 MG  tablet [id number] Take 1 tablet (15 mg total) by mouth daily. PSusy Frizzle MD Taking Active   Multiple Vitamin (MULTIVITAMIN) tablet [id number] Take 1 tablet by mouth daily. [provider] Taking Active   nitrofurantoin, macrocrystal-monohydrate, (MACROBID) 100 MG capsule [id number] Take 1 capsule (100 mg total) by mouth 2 (two) times daily. PSusy Frizzle MD Taking Active   nystatin ointment (MYCOSTATIN) [id number] Apply 1 application topically 2 (two) times daily as needed. [provider] Taking Active   sulfamethoxazole-trimethoprim (BACTRIM DS) 800-160 MG tablet [id number] Take 1 tablet by mouth 2 (two) times daily. PSusy Frizzle MD Taking Active   thyroid ([facility] LLCTHYROID) 60 MG tablet [id number] Take (  1.5) tabs 4x weekly, and take (1) tab 3x weekly Susy Frizzle, MD Taking Active   vitamin B-12 (CYANOCOBALAMIN) 1000 MCG tablet [id number] Yes Take 5,000 mcg by mouth daily. [provider] Taking Active   vitamin E 100 UNIT capsule [id number] Yes Take 200 Units by mouth daily. [provider] Taking Active             Patient Active Problem List   Diagnosis Date Noted   DDD (degenerative disc disease), cervical 02/27/2021   Near syncope 09/20/2020   Chest pain at rest 03/26/2014   GERD (gastroesophageal reflux disease) 03/26/2014   Essential hypertension 03/26/2014   Osteoporosis    Hypothyroidism    H/O colon cancer, stage II 11/15/2011    Immunization History  Administered Date(s) Administered   Fluad Quad(high Dose 65+) 02/17/2019, 02/16/2020, 02/10/2021   Influenza, High Dose Seasonal PF 03/01/2017, 03/06/2018   Influenza,inj,Quad PF,6+ Mos 03/10/2013, 03/08/2014, 03/17/2015, 03/13/2016   PFIZER(Purple Top)SARS-COV-2 Vaccination 06/15/2019, 07/06/2019, 03/16/2020   Pfizer Covid-19 Vaccine Bivalent Booster 14yr & up 03/08/2021   Pneumococcal Conjugate-13 05/11/2013   Pneumococcal Polysaccharide-23  05/31/2014   Td 02/25/1996   Tdap 05/03/2011   Zoster, Live 02/23/2011    Conditions to be addressed/monitored:  HTN, GERD, Hypothyroidism, Osteoporosis.    Care Plan : General Pharmacy (Adult)  Updates made by DEdythe Clarity RPH since 06/02/2021 12:00 AM     Problem: Hypothyroidism, HTN, Osteoporosis   Priority: High  Onset Date: 08/09/2020     Goal: Patient-Specific Goal   Note:   Current Barriers:  Unable to independently afford treatment regimen   Pharmacist Clinical Goal(s):  Over the next 180 days, patient will verbalize ability to afford treatment regimen maintain control of blood pressure as evidenced by home monitoring  contact provider office for questions/concerns as evidenced notation of same in electronic health record through collaboration with PharmD and provider.   Interventions: 1:1 collaboration with PSusy Frizzle MD regarding development and update of comprehensive plan of care as evidenced by provider attestation and co-signature Inter-disciplinary care team collaboration (see longitudinal plan of care) Comprehensive medication review performed; medication list updated in electronic medical record  Hypertension (BP goal <140/90) -Controlled -Current treatment: Amlodipine 12mdaily - Appropriate, Effective, Safe, Accessible -Medications previously tried: none noted  -Current home readings: not checking at home regularly, reports usually "normal" -Denies hypotensive/hypertensive symptoms -Educated on Exercise goal of 150 minutes per week; Importance of home blood pressure monitoring; Symptoms of hypotension and importance of maintaining adequate hydration; -Counseled to monitor BP at home periodically, document, and provide log at future appointments -She denies swelling -Recommended to continue current medication  Update 06/01/21 Has not been checking her BP at home as much as she should. Does report that when checked it was  "normal." Denies any dizzinnes or HA Most recent office BP also normal Denies swelling Continue meds for now, monitor at home and contact me with consistent elevations.   Osteoporosis (Goal: Prevent fractures) -Controlled Last DEXA Scan: 09/01/2019             T-Score femoral neck: -2.6             T-Score lumbar spine: -3.6 -Patient is a candidate for pharmacologic treatment due to T-Score < -2.5 in femoral neck -Current treatment  Calcium citrate Vitamin D3 2000 IU Prolia 6079m 6 months -Appropriate, Effective, Safe, Query accessible -Medications previously tried: Fosamax  -Recommend weight-bearing and muscle strengthening exercises for building and maintaining bone density. Patient is  back taking her Prolia injections.  Reports her copay was high last office visit.  She called insurance and it will be ~$100 cheaper if she has Rx filled at pharmacy and brought in to MD office. -Recommended to continue current medication Assessed patient finances. Will also have CCM team reach out about patient assistance through [employer].  Will mail her application to see if it is something she wants to proceed with.  Update 06/02/21 She still has not gotten back to taking her Prolia due to all that has gone on during holidays, etc. She is recovering from [place], doing well overall. We discussed the importance of taking Prolia and patient plans to resume as soon as she gets well and things settle down. She will be due for repeat DEXA in April 2023.  Hypothyroidism (Goal: Maintain TSH) -Controlled -Current treatment  Armour Thyroid 30m -Medications previously tried: none noted -She takes appropriately -Most recent TSH WNL -Recommended to continue current medication   Patient Goals/Self-Care Activities Over the next 180 days, patient will:  - take medications as prescribed check blood pressure a few times per week', document, and provide at future appointments collaborate  with provider on medication access solutions target a minimum of 150 minutes of moderate intensity exercise weekly  Follow Up Plan: The care management team will reach out to the patient again over the next 180 days.             Medication Assistance: Application for Prolia  medication assistance program. in process.  Anticipated assistance start date unknown.  See plan of care for additional detail.  Patient's [facility] is:  [facility] [place] [id number]: [phone]: [phone] Uses pill box? Yes Pt endorses 100% compliance  We discussed: Benefits of medication synchronization, packaging and delivery as well as enhanced pharmacist oversight with [employer]. Patient decided to: Continue current medication management strategy  Care Plan and Follow Up Patient Decision:  Patient agrees to Care Plan and Follow-up.  Plan: The care management team will reach out to the patient again over the next 180 days.  CBeverly Milch PharmD Clinical Pharmacist [place]([phone]

## 2021-06-01 ENCOUNTER — Ambulatory Visit (INDEPENDENT_AMBULATORY_CARE_PROVIDER_SITE_OTHER): Payer: Medicare HMO | Admitting: Pharmacist

## 2021-06-01 DIAGNOSIS — M810 Age-related osteoporosis without current pathological fracture: Secondary | ICD-10-CM

## 2021-06-01 DIAGNOSIS — I10 Essential (primary) hypertension: Secondary | ICD-10-CM

## 2021-06-02 NOTE — Patient Instructions (Addendum)
Visit Information   Goals Addressed             This Visit's Progress    Prevent Falls and Broken Bones-Osteoporosis   Not on track    Timeframe:  Long-Range Goal Priority:  High Start Date:     08/09/20                        Expected End Date:  02/09/21                     Follow Up Date 11/24/20   - always use handrails on the stairs - get at least 10 minutes of activity every day - make an emergency alert plan in case I fall - pick up clutter from the floors - remove, or use a non-slip pad, with my throw rugs    Why is this important?   When you fall, there are 3 things that control if a bone breaks or not.  These are the fall itself, how hard and the direction that you fall and how fragile your bones are.  Preventing falls is very important for you because of fragile bones.     Notes:        Patient Care Plan: General Pharmacy (Adult)     Problem Identified: Hypothyroidism, HTN, Osteoporosis   Priority: High  Onset Date: 08/09/2020     Goal: Patient-Specific Goal   Note:   Current Barriers:  Unable to independently afford treatment regimen   Pharmacist Clinical Goal(s):  Over the next 180 days, patient will verbalize ability to afford treatment regimen maintain control of blood pressure as evidenced by home monitoring  contact provider office for questions/concerns as evidenced notation of same in electronic health record through collaboration with PharmD and provider.   Interventions: 1:1 collaboration with Susy Frizzle, MD regarding development and update of comprehensive plan of care as evidenced by provider attestation and co-signature Inter-disciplinary care team collaboration (see longitudinal plan of care) Comprehensive medication review performed; medication list updated in electronic medical record  Hypertension (BP goal <140/90) -Controlled -Current treatment: Amlodipine 10mg  daily - Appropriate, Effective, Safe, Accessible -Medications  previously tried: none noted  -Current home readings: not checking at home regularly, reports usually "normal" -Denies hypotensive/hypertensive symptoms -Educated on Exercise goal of 150 minutes per week; Importance of home blood pressure monitoring; Symptoms of hypotension and importance of maintaining adequate hydration; -Counseled to monitor BP at home periodically, document, and provide log at future appointments -She denies swelling -Recommended to continue current medication  Update 06/01/21 Has not been checking her BP at home as much as she should. Does report that when checked it was "normal." Denies any dizzinnes or HA Most recent office BP also normal Denies swelling Continue meds for now, monitor at home and contact me with consistent elevations.   Osteoporosis (Goal: Prevent fractures) -Controlled Last DEXA Scan: 09/01/2019             T-Score femoral neck: -2.6             T-Score lumbar spine: -3.6 -Patient is a candidate for pharmacologic treatment due to T-Score < -2.5 in femoral neck -Current treatment  Calcium citrate Vitamin D3 2000 IU Prolia 60mg  q 6 months -Appropriate, Effective, Safe, Query accessible -Medications previously tried: Fosamax  -Recommend weight-bearing and muscle strengthening exercises for building and maintaining bone density. Patient is back taking her Prolia injections.  Reports her copay was high last office  visit.  She called insurance and it will be ~$100 cheaper if she has Rx filled at pharmacy and brought in to MD office. -Recommended to continue current medication Assessed patient finances. Will also have CCM team reach out about patient assistance through [employer].  Will mail her application to see if it is something she wants to proceed with.  Update 06/02/21 She still has not gotten back to taking her Prolia due to all that has gone on during holidays, etc. She is recovering from [place], doing well overall. We  discussed the importance of taking Prolia and patient plans to resume as soon as she gets well and things settle down. She will be due for repeat DEXA in April 2023.  Hypothyroidism (Goal: Maintain TSH) -Controlled -Current treatment  Armour Thyroid 60mg  -Medications previously tried: none noted -She takes appropriately -Most recent TSH WNL -Recommended to continue current medication   Patient Goals/Self-Care Activities Over the next 180 days, patient will:  - take medications as prescribed check blood pressure a few times per week', document, and provide at future appointments collaborate with provider on medication access solutions target a minimum of 150 minutes of moderate intensity exercise weekly  Follow Up Plan: The care management team will reach out to the patient again over the next 180 days.            Patient verbalizes understanding of instructions provided today and agrees to view in [place].  Telephone follow up appointment with pharmacy team member scheduled for: 6 months  Edythe Clarity, [place], PharmD, [place] Clinical Pharmacist Practitioner [place] [phone]

## 2021-06-09 ENCOUNTER — Encounter: Payer: Self-pay | Admitting: Nurse Practitioner

## 2021-06-09 ENCOUNTER — Other Ambulatory Visit: Payer: Self-pay

## 2021-06-09 ENCOUNTER — Ambulatory Visit (INDEPENDENT_AMBULATORY_CARE_PROVIDER_SITE_OTHER): Payer: Medicare HMO | Admitting: Nurse Practitioner

## 2021-06-09 VITALS — BP 148/80 | HR 91 | Ht 63.0 in | Wt 108.0 lb

## 2021-06-09 DIAGNOSIS — H9192 Unspecified hearing loss, left ear: Secondary | ICD-10-CM | POA: Diagnosis not present

## 2021-06-09 NOTE — Progress Notes (Signed)
Subjective:    Patient ID: Joanne Gomez, female    DOB: 1930-06-11, 88 y.o.   MRN: [id number]  HPI: Joanne Gomez is a 88 y.o. female presenting for ear fullness.   Chief Complaint  Patient presents with   Ear Fullness   EAG CLOGGED Duration: weeks Involved ear(s): left Sensation of feeling clogged/plugged: yes Decreased/muffled hearing:yes Ear pain: no Fever: no Otorrhea: no Hearing loss: yes Upper respiratory infection symptoms: no Using Q-Tips: no Status: stable History of cerumenosis: yes Treatments attempted: nothing tried  Of note, has used flonase in the past and reports this gives her nose bleeds.  No Known Allergies  Outpatient Encounter Medications as of 06/09/2021  Medication Sig   amLODipine (NORVASC) 2.5 MG tablet Take 1 tablet (2.5 mg total) by mouth daily.   CALCIUM CITRATE PO Take 1 tablet by mouth 2 (two) times daily. 630mg    Cholecalciferol (VITAMIN D3) 2000 units TABS Take 1 tablet by mouth daily.   cycloSPORINE (RESTASIS) 0.05 % ophthalmic emulsion Restasis MultiDose 0.05 % eye drops   esomeprazole (NEXIUM) 40 MG capsule Take 40 mg by mouth daily as needed.   Fluocinolone Acetonide 0.01 % OIL Place 3 drops in ear(s) 2 (two) times daily as needed.   Lifitegrast (XIIDRA) 5 % SOLN Xiidra 5 % eye drops in a dropperette   Magnesium 250 MG TABS Take 1 tablet by mouth daily.   meloxicam (MOBIC) 15 MG tablet Take 1 tablet (15 mg total) by mouth daily.   Multiple Vitamin (MULTIVITAMIN) tablet Take 1 tablet by mouth daily.   nystatin ointment (MYCOSTATIN) Apply 1 application topically 2 (two) times daily as needed.   thyroid (ARMOUR THYROID) 60 MG tablet Take (1.5) tabs 4x weekly, and take (1) tab 3x weekly   vitamin B-12 (CYANOCOBALAMIN) 1000 MCG tablet Take 5,000 mcg by mouth daily.   vitamin E 100 UNIT capsule Take 200 Units by mouth daily.   [DISCONTINUED] nitrofurantoin, macrocrystal-monohydrate, (MACROBID) 100 MG capsule Take 1 capsule (100 mg  total) by mouth 2 (two) times daily.   [DISCONTINUED] sulfamethoxazole-trimethoprim (BACTRIM DS) 800-160 MG tablet Take 1 tablet by mouth 2 (two) times daily.   No facility-administered encounter medications on file as of 06/09/2021.    Patient Active Problem List   Diagnosis Date Noted   DDD (degenerative disc disease), cervical 02/27/2021   Near syncope 09/20/2020   Chest pain at rest 03/26/2014   GERD (gastroesophageal reflux disease) 03/26/2014   Essential hypertension 03/26/2014   Osteoporosis    Hypothyroidism    H/O colon cancer, stage II 11/15/2011    Past Medical History:  Diagnosis Date   Cancer ([place]) 11/2007   colon stage II   Diverticula of colon    GERD (gastroesophageal reflux disease)    H/O colon cancer, stage II 11/15/2011   HH (hiatus hernia)    History of hiatal hernia    HOH (hard of hearing)    MILD LOSS   Hyperlipidemia    Hypothyroidism    Osteoporosis    Pneumonia     Relevant past medical, surgical, family and social history reviewed and updated as indicated. Interim medical history since our last visit reviewed.  Review of Systems  Constitutional: Negative.   HENT:  Positive for hearing loss. Negative for congestion, ear discharge, ear pain, nosebleeds, postnasal drip, rhinorrhea, sinus pressure, sinus pain, sore throat and trouble swallowing.   Eyes: Negative.   Respiratory: Negative.  Negative for cough.   Cardiovascular: Negative.  Negative for  chest pain.  Skin: Negative.   Psychiatric/Behavioral: Negative.    Per HPI unless specifically indicated above     Objective:    BP (!) 148/80    Pulse 91    Ht 5\' 3"  (1.6 m)    Wt 108 lb (49 kg)    SpO2 97%    BMI 19.13 kg/m   Wt Readings from Last 3 Encounters:  06/09/21 108 lb (49 kg)  01/02/21 103 lb (46.7 kg)  12/09/20 103 lb 12.8 oz (47.1 kg)    Physical Exam Vitals and nursing note reviewed.  Constitutional:      General: She is not in acute distress.    Appearance: Normal  appearance. She is not toxic-appearing.  HENT:     Head: Normocephalic and atraumatic.     Right Ear: Tympanic membrane, ear canal and external ear normal. There is no impacted cerumen.     Left Ear: Ear canal and external ear normal. There is no impacted cerumen.     Ears:     Comments: Tympanosclerosis appreciated Eyes:     General: No scleral icterus.    Extraocular Movements: Extraocular movements intact.  Cardiovascular:     Rate and Rhythm: Normal rate.  Pulmonary:     Effort: Pulmonary effort is normal.  Skin:    General: Skin is warm and dry.     Coloration: Skin is not jaundiced or pale.     Findings: No erythema.  Neurological:     Mental Status: She is alert and oriented to person, place, and time.     Motor: No weakness.     Gait: Gait normal.  Psychiatric:        Mood and Affect: Mood normal.        Behavior: Behavior normal.        Thought Content: Thought content normal.        Judgment: Judgment normal.      Assessment & Plan:  1. Decreased hearing of left ear Acute.  Discussed that I do not appreciate any impacted cerumen today on examination.  She recently had an acute upper respiratory virus and I am wondering if she may be having some eustachian tube dysfunction.  I encouraged her to start flonase daily to help with this.  She can use nasal saline to help keep nasal passage moist.  Follow up with ENT if symptoms do not improve and/or hearing does not return to normal after a couple of weeks.    Follow up plan: Return if symptoms worsen or fail to improve.

## 2021-06-19 DIAGNOSIS — M542 Cervicalgia: Secondary | ICD-10-CM | POA: Diagnosis not present

## 2021-06-19 DIAGNOSIS — G4486 Cervicogenic headache: Secondary | ICD-10-CM | POA: Diagnosis not present

## 2021-06-19 DIAGNOSIS — M5030 Other cervical disc degeneration, unspecified cervical region: Secondary | ICD-10-CM | POA: Diagnosis not present

## 2021-06-19 DIAGNOSIS — R293 Abnormal posture: Secondary | ICD-10-CM | POA: Diagnosis not present

## 2021-06-27 DIAGNOSIS — E039 Hypothyroidism, unspecified: Secondary | ICD-10-CM | POA: Diagnosis not present

## 2021-06-27 DIAGNOSIS — I10 Essential (primary) hypertension: Secondary | ICD-10-CM | POA: Diagnosis not present

## 2021-06-27 DIAGNOSIS — M810 Age-related osteoporosis without current pathological fracture: Secondary | ICD-10-CM | POA: Diagnosis not present

## 2021-06-30 DIAGNOSIS — M542 Cervicalgia: Secondary | ICD-10-CM | POA: Diagnosis not present

## 2021-06-30 DIAGNOSIS — M5030 Other cervical disc degeneration, unspecified cervical region: Secondary | ICD-10-CM | POA: Diagnosis not present

## 2021-06-30 DIAGNOSIS — G4486 Cervicogenic headache: Secondary | ICD-10-CM | POA: Diagnosis not present

## 2021-06-30 DIAGNOSIS — R293 Abnormal posture: Secondary | ICD-10-CM | POA: Diagnosis not present

## 2021-07-10 ENCOUNTER — Telehealth: Payer: Self-pay | Admitting: Family Medicine

## 2021-07-10 DIAGNOSIS — R293 Abnormal posture: Secondary | ICD-10-CM | POA: Diagnosis not present

## 2021-07-10 DIAGNOSIS — G4486 Cervicogenic headache: Secondary | ICD-10-CM | POA: Diagnosis not present

## 2021-07-10 DIAGNOSIS — M5030 Other cervical disc degeneration, unspecified cervical region: Secondary | ICD-10-CM | POA: Diagnosis not present

## 2021-07-10 DIAGNOSIS — M542 Cervicalgia: Secondary | ICD-10-CM | POA: Diagnosis not present

## 2021-07-10 NOTE — Telephone Encounter (Signed)
Patient left voicemail message to request call back from provider's nurse about unavailable meds; patient didn't specify names of meds in message.   Please advise at [phone].

## 2021-07-11 NOTE — Telephone Encounter (Signed)
Spoke with pt and she was actually calling about her husband, [name]. She states they have worked out his medication issue. She does not need further assistance at this time.

## 2021-07-23 ENCOUNTER — Encounter: Payer: Self-pay | Admitting: Family Medicine

## 2021-08-10 DIAGNOSIS — R293 Abnormal posture: Secondary | ICD-10-CM | POA: Diagnosis not present

## 2021-08-10 DIAGNOSIS — M542 Cervicalgia: Secondary | ICD-10-CM | POA: Diagnosis not present

## 2021-08-10 DIAGNOSIS — M5030 Other cervical disc degeneration, unspecified cervical region: Secondary | ICD-10-CM | POA: Diagnosis not present

## 2021-08-10 DIAGNOSIS — G4486 Cervicogenic headache: Secondary | ICD-10-CM | POA: Diagnosis not present

## 2021-09-04 ENCOUNTER — Telehealth: Payer: Self-pay | Admitting: Pharmacist

## 2021-09-04 NOTE — Progress Notes (Signed)
? ? ?  Chronic Care Management ?Pharmacy Assistant  ? ?Name: Joanne Gomez  MRN: [id number] DOB: Dec 03, 1930 ? ? ?Reason for Encounter: Disease State - General Adherence Call  ?  ? ?Recent office visits:  ?06/09/21 Jannette Spanner, NP - Family Medicine - Decreased hearing - use nasal saline to help keep nasal passage moist.  Follow up with ENT if symptoms do not improve and/or hearing does not return to normal after a couple of weeks ? ?Recent consult visits:  ?None noted. ? ?Hospital visits:  ?None in previous 6 months ? ?Medications: ?Outpatient Encounter Medications as of 09/04/2021  ?Medication Sig  ? amLODipine (NORVASC) 2.5 MG tablet Take 1 tablet (2.5 mg total) by mouth daily.  ? CALCIUM CITRATE PO Take 1 tablet by mouth 2 (two) times daily. '630mg'$   ? Cholecalciferol (VITAMIN D3) 2000 units TABS Take 1 tablet by mouth daily.  ? cycloSPORINE (RESTASIS) 0.05 % ophthalmic emulsion Restasis MultiDose 0.05 % eye drops  ? esomeprazole (NEXIUM) 40 MG capsule Take 40 mg by mouth daily as needed.  ? Fluocinolone Acetonide 0.01 % OIL Place 3 drops in ear(s) 2 (two) times daily as needed.  ? Lifitegrast (XIIDRA) 5 % SOLN Xiidra 5 % eye drops in a dropperette  ? Magnesium 250 MG TABS Take 1 tablet by mouth daily.  ? meloxicam (MOBIC) 15 MG tablet Take 1 tablet (15 mg total) by mouth daily.  ? Multiple Vitamin (MULTIVITAMIN) tablet Take 1 tablet by mouth daily.  ? nystatin ointment (MYCOSTATIN) Apply 1 application topically 2 (two) times daily as needed.  ? thyroid (ARMOUR THYROID) 60 MG tablet Take (1.5) tabs 4x weekly, and take (1) tab 3x weekly  ? vitamin B-12 (CYANOCOBALAMIN) 1000 MCG tablet Take 5,000 mcg by mouth daily.  ? vitamin E 100 UNIT capsule Take 200 Units by mouth daily.  ? ?No facility-administered encounter medications on file as of 09/04/2021.  ? ? ?Have you had any problems recently with your health? ?Patient denied any recent problems with her health. She reported she just saw Dr Dennard Schaumann a few days ago and  is doing well.  ? ?Have you had any problems with your pharmacy? ?Patient denied any problems or concerns with her current pharmacy. ? ?What issues or side effects are you having with your medications? ?Patient denied any issues or side effects with her current medications.  ? ?What would you like me to pass along to Leata Mouse, CPP for them to help you with?  ?Patient did not have anything to pass along to CPP at this time. She reported she is doing well.  ? ?What can we do to take care of you better? ?Patient did not have any recommendations at this time. She appreciated the call and follow up.  ? ? ?Care Gaps ?  ?AWV: done 09/19/20 ?Colonoscopy: done 04/26/14 ?DM Eye Exam: N/A ?DM Foot Exam: N/A ?Microalbumin: N/A ?HbgAIC: N/A ?DEXA: done 09/01/19 ?Mammogram: done 11/02/20 ?  ?Star Rating Drugs: ?No Star Rating Drugs Noted ? ? ? ?Future Appointments  ?Date Time Provider [place]  ?12/07/2021  3:45 PM [facility] PHARMACIST [facility]  ? ? ? ?Liza Showfety, CCMA ?Clinical Pharmacist Assistant  ?[phone] ? ? ?

## 2021-09-19 ENCOUNTER — Ambulatory Visit (INDEPENDENT_AMBULATORY_CARE_PROVIDER_SITE_OTHER): Payer: Medicare HMO | Admitting: Family Medicine

## 2021-09-19 VITALS — BP 115/80 | HR 77 | Temp 97.7°F | Ht 63.0 in | Wt 109.0 lb

## 2021-09-19 DIAGNOSIS — E039 Hypothyroidism, unspecified: Secondary | ICD-10-CM

## 2021-09-19 DIAGNOSIS — I10 Essential (primary) hypertension: Secondary | ICD-10-CM | POA: Diagnosis not present

## 2021-09-19 LAB — TSH: TSH: 2 mIU/L (ref 0.40–4.50)

## 2021-09-19 LAB — COMPLETE METABOLIC PANEL WITH GFR
AG Ratio: 1.3 (calc) (ref 1.0–2.5)
ALT: 11 U/L (ref 6–29)
AST: 19 U/L (ref 10–35)
Albumin: 4.4 g/dL (ref 3.6–5.1)
Alkaline phosphatase (APISO): 69 U/L (ref 37–153)
BUN: 18 mg/dL (ref 7–25)
CO2: 31 mmol/L (ref 20–32)
Calcium: 10.2 mg/dL (ref 8.6–10.4)
Chloride: 103 mmol/L (ref 98–110)
Creat: 0.69 mg/dL (ref 0.60–0.95)
Globulin: 3.5 g/dL (calc) (ref 1.9–3.7)
Glucose, Bld: 92 mg/dL (ref 65–99)
Potassium: 4.8 mmol/L (ref 3.5–5.3)
Sodium: 141 mmol/L (ref 135–146)
Total Bilirubin: 0.4 mg/dL (ref 0.2–1.2)
Total Protein: 7.9 g/dL (ref 6.1–8.1)
eGFR: 83 mL/min/{1.73_m2} (ref >=60)

## 2021-09-19 LAB — CBC WITH DIFFERENTIAL/PLATELET
Absolute Monocytes: 600 cells/uL (ref 200–950)
Basophils Absolute: 52 cells/uL (ref 0–200)
Basophils Relative: 0.6 %
Eosinophils Absolute: 139 cells/uL (ref 15–500)
Eosinophils Relative: 1.6 %
HCT: 43.2 % (ref 35.0–45.0)
Hemoglobin: 13.9 g/dL (ref 11.7–15.5)
Lymphs Abs: 1879 cells/uL (ref 850–3900)
MCH: 28.5 pg (ref 27.0–33.0)
MCHC: 32.2 g/dL (ref 32.0–36.0)
MCV: 88.7 fL (ref 80.0–100.0)
MPV: 11.4 fL (ref 7.5–12.5)
Monocytes Relative: 6.9 %
Neutro Abs: 6029 cells/uL (ref 1500–7800)
Neutrophils Relative %: 69.3 %
Platelets: 227 10*3/uL (ref 140–400)
RBC: 4.87 10*6/uL (ref 3.80–5.10)
RDW: 12.7 % (ref 11.0–15.0)
Total Lymphocyte: 21.6 %
WBC: 8.7 10*3/uL (ref 3.8–10.8)

## 2021-09-19 LAB — LIPID PANEL
Cholesterol: 195 mg/dL (ref <200)
HDL: 83 mg/dL (ref >=50)
LDL Cholesterol (Calc): 97 mg/dL (calc)
Non-HDL Cholesterol (Calc): 112 mg/dL (calc) (ref <130)
Total CHOL/HDL Ratio: 2.3 (calc) (ref <5.0)
Triglycerides: 60 mg/dL (ref <150)

## 2021-09-19 NOTE — Progress Notes (Signed)
? ?Subjective:  ? ? Patient ID: Joanne Gomez, female    DOB: [birth year], 88 y.o.   MRN: [id number] ? ? ?Wt Readings from Last 3 Encounters:  ?09/19/21 109 lb (49.4 kg)  ?06/09/21 108 lb (49 kg)  ?01/02/21 103 lb (46.7 kg)  ? ?Patient has a history of hypothyroidism.  We have not checked lab work in almost 8 months.  Her weight is stable.  She denies any chest pain or shortness of breath or dyspnea on exertion.  She is requesting a DNR.  She has been under a lot of stress recently.  Her husband's health continues to decline as his Parkinson's disease progresses and he now has dementia with occasional episodes of delirium.  However she denies any depression or insomnia or panic attacks.  She is obviously sad and under stress but she seems to be handling it well.  She denies any abdominal pain or melena or hematochezia.  She denies any bruising or hemoptysis or palpitations.  She does have occasional PVCs auscultated on exam but these are asymptomatic. ? ?Past Medical History:  ?Diagnosis Date  ? Cancer ([place]) 11/2007  ? colon stage II  ? Diverticula of colon   ? GERD (gastroesophageal reflux disease)   ? H/O colon cancer, stage II 11/15/2011  ? HH (hiatus hernia)   ? History of hiatal hernia   ? HOH (hard of hearing)   ? MILD LOSS  ? Hyperlipidemia   ? Hypothyroidism   ? Osteoporosis   ? Pneumonia   ? ?Past Surgical History:  ?Procedure Laterality Date  ? ABDOMINAL HYSTERECTOMY    ? APPENDECTOMY    ? CATARACT EXTRACTION W/PHACO Right 08/30/2015  ? Procedure: CATARACT EXTRACTION PHACO AND INTRAOCULAR LENS PLACEMENT (IOC);  Surgeon: Birder Robson, MD;  Location: [facility] ORS;  Service: Ophthalmology;  Laterality: Right;   [place]    00:40.8 ?AP%  15.7 ?CDE    6.42   ?fluid pack lot #[id number]  exp 02/24/2017  ? CATARACT EXTRACTION W/PHACO Left 09/20/2015  ? Procedure: CATARACT EXTRACTION PHACO AND INTRAOCULAR LENS PLACEMENT (IOC);  Surgeon: Birder Robson, MD;  Location: [facility] ORS;  Service: Ophthalmology;  Laterality: Left;  [place]  44.8 ?AP% 19.6 ?CDE 8.77 ?FLUID PACK LOT # [id number]  ? CHOLECYSTECTOMY    ? ELBOW SURGERY    ? right   ? hemocolectomy  2009  ? VAGINAL PROLAPSE REPAIR    ? ?Current Outpatient Medications on File Prior to Visit  ?Medication Sig Dispense Refill  ? amLODipine (NORVASC) 2.5 MG tablet Take 1 tablet (2.5 mg total) by mouth daily. 90 tablet 3  ? CALCIUM CITRATE PO Take 1 tablet by mouth 2 (two) times daily. '630mg'$     ? Cholecalciferol (VITAMIN D3) 2000 units TABS Take 1 tablet by mouth daily.    ? cycloSPORINE (RESTASIS) 0.05 % ophthalmic emulsion Restasis MultiDose 0.05 % eye drops    ? esomeprazole (NEXIUM) 40 MG capsule Take 40 mg by mouth daily as needed.    ? Fluocinolone Acetonide 0.01 % OIL Place 3 drops in ear(s) 2 (two) times daily as needed. 20 mL 0  ? Lifitegrast (XIIDRA) 5 % SOLN Xiidra 5 % eye drops in a dropperette    ? Magnesium 250 MG TABS Take 1 tablet by mouth daily.    ? meloxicam (MOBIC) 15 MG tablet Take 1 tablet (15 mg total) by mouth daily. 30 tablet 0  ? Multiple Vitamin (MULTIVITAMIN) tablet Take 1 tablet by mouth daily.    ? nystatin ointment (  MYCOSTATIN) Apply 1 application topically 2 (two) times daily as needed.    ? thyroid (ARMOUR THYROID) 60 MG tablet Take (1.5) tabs 4x weekly, and take (1) tab 3x weekly 135 tablet 1  ? vitamin B-12 (CYANOCOBALAMIN) 1000 MCG tablet Take 5,000 mcg by mouth daily.    ? vitamin E 100 UNIT capsule Take 200 Units by mouth daily.    ? ?No current facility-administered medications on file prior to visit.  ? ?No Known Allergies ?Social History  ? ?Socioeconomic History  ? Marital status: Married  ?  Spouse name: Not on file  ? Number of children: Not on file  ? Years of education: Not on file  ? Highest education level: Not on file  ?Occupational History  ? Not on file  ?Tobacco Use  ? Smoking status: Never  ? Smokeless tobacco: Never  ?Vaping Use  ? Vaping Use: Never used  ?Substance and Sexual Activity  ? Alcohol use: Yes  ?  Comment: occ  ? Drug use: No  ?  Sexual activity: Yes  ?  Comment: Married to [place], retired.  ?Other Topics Concern  ? Not on file  ?Social History Narrative  ? Not on file  ? ?Social Determinants of Health  ? ?Financial Resource Strain: Low Risk   ? Difficulty of Paying Living Expenses: Not hard at all  ?Food Insecurity: No Food Insecurity  ? Worried About Charity fundraiser in the Last Year: Never true  ? Ran Out of Food in the Last Year: Never true  ?Transportation Needs: No Transportation Needs  ? Lack of Transportation (Medical): No  ? Lack of Transportation (Non-Medical): No  ?Physical Activity: Insufficiently Active  ? Days of Exercise per Week: 3 days  ? Minutes of Exercise per Session: 20 min  ?Stress: No Stress Concern Present  ? Feeling of Stress : Not at all  ?Social Connections: Moderately Integrated  ? Frequency of Communication with Friends and Family: More than three times a week  ? Frequency of Social Gatherings with Friends and Family: Three times a week  ? Attends Religious Services: 1 to 4 times per year  ? Active Member of Clubs or Organizations: No  ? Attends Archivist Meetings: Never  ? Marital Status: Married  ?Intimate Partner Violence: Not At Risk  ? Fear of Current or Ex-Partner: No  ? Emotionally Abused: No  ? Physically Abused: No  ? Sexually Abused: No  ? ? ? ? ?Review of Systems  ?All other systems reviewed and are negative. ? ?   ?Objective:  ?  ?Vitals reviewed.  ?Constitutional:   ?   General: She is not in acute distress. ?   Appearance: Normal appearance. She is normal weight. She is not ill-appearing or toxic-appearing.  ?Neck:  ? ?Cardiovascular:  ?   Rate and Rhythm: Normal rate and regular rhythm.  ?   Heart sounds: Normal heart sounds.  ?Pulmonary:  ?   Effort: Pulmonary effort is normal. No respiratory distress.  ?   Breath sounds: Normal breath sounds. No stridor. No wheezing or rhonchi.  ?Abdominal:  ?   General: Abdomen is flat. Bowel sounds are normal. There is no distension.  ?    Palpations: Abdomen is soft. There is no mass.  ?   Tenderness: There is no abdominal tenderness. There is no guarding or rebound.  ?   Hernia: No hernia is present.  ?Musculoskeletal:  ?   Cervical back: No edema, erythema, signs of trauma,  rigidity or crepitus. Pain with movement and spinous process tenderness present. Decreased range of motion.  ?Neurological:  ?   Mental Status: She is alert.  ?Patient is wearing a TENS unit on her neck in the area diagrammed.  She is making appointment to see her orthopedist for cortisone injections in the neck.  The remainder of her exam is normal ? ? ?   ?Assessment & Plan:  ?Hypothyroidism, unspecified type - Plan: TSH ? ?Essential hypertension - Plan: CBC with Differential/Platelet, COMPLETE METABOLIC PANEL WITH GFR, Lipid panel ?Her blood pressure today is quite low.  I will ask her to discontinue amlodipine.  Check a TSH along with a CMP, lipid panel, and CBC.  Uptitrate or decrease Armour Thyroid based on TSH.  Otherwise she is doing well and is asymptomatic.  Defer management of her neck pain to her orthopedist.  We did complete DNR/DNI paperwork today in accordance with her wishes.  I gave her copies to take home. ?

## 2021-09-20 ENCOUNTER — Telehealth: Payer: Self-pay | Admitting: Family Medicine

## 2021-09-20 NOTE — Telephone Encounter (Signed)
Patient called states she needs a refill on her nystatin cream. She states Dr. Helane Rima won't refill unless she is seen. She was wanting to know if Dr. Dennard Schaumann would refill. Please advise, ? ?CB# nystatin ointment (MYCOSTATIN) [[id number]]  ?  Order Details ?Dose: 1 application. Route: Topical Frequency: 2 times daily PRN  ?Dispense Quantity: -- Refills: --   ?     ?Sig: Apply 1 application topically 2 (two) times daily as needed.  ? ?

## 2021-09-21 ENCOUNTER — Other Ambulatory Visit: Payer: Self-pay | Admitting: Family Medicine

## 2021-09-21 MED ORDER — NYSTATIN 100000 UNIT/GM EX OINT: 1.0000 "application " | TOPICAL_OINTMENT | Freq: Two times a day (BID) | CUTANEOUS | 3 refills | Status: AC | PRN

## 2021-09-21 NOTE — Telephone Encounter (Signed)
Per patient she uses this 3xW for vaginal irritation.  ? ?Please advise if ok to refill, thanks! ?

## 2021-12-07 ENCOUNTER — Telehealth: Payer: PPO

## 2021-12-12 DIAGNOSIS — M5030 Other cervical disc degeneration, unspecified cervical region: Secondary | ICD-10-CM | POA: Diagnosis not present

## 2021-12-12 DIAGNOSIS — M542 Cervicalgia: Secondary | ICD-10-CM | POA: Diagnosis not present

## 2021-12-21 DIAGNOSIS — M542 Cervicalgia: Secondary | ICD-10-CM | POA: Diagnosis not present

## 2021-12-28 ENCOUNTER — Ambulatory Visit (INDEPENDENT_AMBULATORY_CARE_PROVIDER_SITE_OTHER): Payer: Medicare HMO | Admitting: Family Medicine

## 2021-12-28 ENCOUNTER — Other Ambulatory Visit: Payer: Self-pay | Admitting: Family Medicine

## 2021-12-28 VITALS — BP 132/82

## 2021-12-28 DIAGNOSIS — I10 Essential (primary) hypertension: Secondary | ICD-10-CM | POA: Diagnosis not present

## 2021-12-28 MED ORDER — AMLODIPINE BESYLATE 10 MG PO TABS: 10.0000 mg | ORAL_TABLET | Freq: Every day | ORAL | 3 refills | Status: DC

## 2021-12-28 NOTE — Progress Notes (Signed)
Subjective:    Patient ID: Joanne Gomez, female    DOB: October 27, 1930, 88 y.o.   MRN: [id number]   Patient's blood pressures have consistently been high at home over the last few weeks.  Her systolic blood pressures have been 937-169 with diastolic blood pressures approaching 90.  She is supposed to be on amlodipine however this medicine has been stopped.  She is uncertain as to why.  She denies any chest pain shortness of breath or dyspnea on exertion however she does report a headache.  I rechecked her blood pressure today and I got 168/90 in the left arm and I got 178/92 in the right arm  Past Medical History:  Diagnosis Date   Cancer ([place]) 11/2007   colon stage II   Diverticula of colon    GERD (gastroesophageal reflux disease)    H/O colon cancer, stage II 11/15/2011   HH (hiatus hernia)    History of hiatal hernia    HOH (hard of hearing)    MILD LOSS   Hyperlipidemia    Hypothyroidism    Osteoporosis    Pneumonia    Past Surgical History:  Procedure Laterality Date   ABDOMINAL HYSTERECTOMY     APPENDECTOMY     CATARACT EXTRACTION W/PHACO Right 08/30/2015   Procedure: CATARACT EXTRACTION PHACO AND INTRAOCULAR LENS PLACEMENT ([place]);  Surgeon: Birder Robson, MD;  Location: [facility] ORS;  Service: Ophthalmology;  Laterality: Right;   [place]    00:40.8 AP%  15.7 CDE    6.42   fluid pack lot #[id number]  exp 02/24/2017   CATARACT EXTRACTION W/PHACO Left 09/20/2015   Procedure: CATARACT EXTRACTION PHACO AND INTRAOCULAR LENS PLACEMENT (IOC);  Surgeon: Birder Robson, MD;  Location: [facility] ORS;  Service: Ophthalmology;  Laterality: Left;  [place] 44.8 AP% 19.6 CDE 8.77 FLUID PACK LOT # [id number]   CHOLECYSTECTOMY     ELBOW SURGERY     right    hemocolectomy  2009   VAGINAL PROLAPSE REPAIR     Current Outpatient Medications on File Prior to Visit  Medication Sig Dispense Refill   amLODipine (NORVASC) 2.5 MG tablet Take 1 tablet (2.5 mg total) by mouth daily. 90 tablet 3   CALCIUM CITRATE PO  Take 1 tablet by mouth 2 (two) times daily. '630mg'$      Cholecalciferol (VITAMIN D3) 2000 units TABS Take 1 tablet by mouth daily.     esomeprazole (NEXIUM) 40 MG capsule Take 40 mg by mouth daily as needed.     Fluocinolone Acetonide 0.01 % OIL Place 3 drops in ear(s) 2 (two) times daily as needed. 20 mL 0   Lifitegrast (XIIDRA) 5 % SOLN Xiidra 5 % eye drops in a dropperette     Magnesium 250 MG TABS Take 1 tablet by mouth daily.     Multiple Vitamin (MULTIVITAMIN) tablet Take 1 tablet by mouth daily.     nystatin ointment (MYCOSTATIN) Apply 1 application. topically 2 (two) times daily as needed. 30 g 3   thyroid (ARMOUR THYROID) 60 MG tablet Take (1.5) tabs 4x weekly, and take (1) tab 3x weekly 135 tablet 1   vitamin B-12 (CYANOCOBALAMIN) 1000 MCG tablet Take 5,000 mcg by mouth daily.     vitamin E 100 UNIT capsule Take 200 Units by mouth daily.     No current facility-administered medications on file prior to visit.   No Known Allergies Social History   Socioeconomic History   Marital status: Married    Spouse name: Not on file  Number of children: Not on file   Years of education: Not on file   Highest education level: Not on file  Occupational History   Not on file  Tobacco Use   Smoking status: Never   Smokeless tobacco: Never  Vaping Use   Vaping Use: Never used  Substance and Sexual Activity   Alcohol use: Yes    Comment: occ   Drug use: No   Sexual activity: Yes    Comment: Married to [place], retired.  Other Topics Concern   Not on file  Social History Narrative   Not on file   Social Determinants of Health   Financial Resource Strain: Low Risk  (12/01/2020)   Overall Financial Resource Strain (CARDIA)    Difficulty of Paying Living Expenses: Not hard at all  Food Insecurity: No Food Insecurity (12/01/2020)   Hunger Vital Sign    Worried About Running Out of Food in the Last Year: Never true    [place] in the Last Year: Never true  Transportation Needs: No  Transportation Needs (12/01/2020)   PRAPARE - Hydrologist (Medical): No    Lack of Transportation (Non-Medical): No  Physical Activity: Insufficiently Active (12/01/2020)   Exercise Vital Sign    Days of Exercise per Week: 3 days    Minutes of Exercise per Session: 20 min  Stress: No Stress Concern Present (12/01/2020)   [place]    Feeling of Stress : Not at all  Social Connections: Moderately Integrated (12/01/2020)   Social Connection and Isolation Panel [NHANES]    Frequency of Communication with Friends and Family: More than three times a week    Frequency of Social Gatherings with Friends and Family: Three times a week    Attends Religious Services: 1 to 4 times per year    Active Member of Clubs or Organizations: No    Attends Archivist Meetings: Never    Marital Status: Married  Human resources officer Violence: Not At Risk (12/01/2020)   Humiliation, Afraid, Rape, and Kick questionnaire    Fear of Current or Ex-Partner: No    Emotionally Abused: No    Physically Abused: No    Sexually Abused: No      Review of Systems  All other systems reviewed and are negative.      Objective:    Vitals reviewed.  Constitutional:      General: She is not in acute distress.    Appearance: Normal appearance. She is normal weight. She is not ill-appearing or toxic-appearing.  Neck:   Cardiovascular:     Rate and Rhythm: Normal rate and regular rhythm.     Heart sounds: Normal heart sounds.  Pulmonary:     Effort: Pulmonary effort is normal. No respiratory distress.     Breath sounds: Normal breath sounds. No stridor. No wheezing or rhonchi.  Abdominal:     General: Abdomen is flat. Bowel sounds are normal. There is no distension.     Palpations: Abdomen is soft. There is no mass.     Tenderness: There is no abdominal tenderness. There is no guarding or rebound.     Hernia: No hernia  is present.  Neurological:     Mental Status: She is alert.   Assessment & Plan:  Essential hypertension Resume amlodipine but I will increase the dose to 10 mg a day given the significance of her blood pressure and plan on  rechecking her blood pressure in 1 week.  Patient denies any symptoms to suggest endorgan damage.  Clinically she appears to be comfortable with no chest pain shortness of breath or neurologic deficit

## 2022-01-02 DIAGNOSIS — M5030 Other cervical disc degeneration, unspecified cervical region: Secondary | ICD-10-CM | POA: Diagnosis not present

## 2022-01-04 DIAGNOSIS — H6983 Other specified disorders of Eustachian tube, bilateral: Secondary | ICD-10-CM | POA: Diagnosis not present

## 2022-01-05 ENCOUNTER — Ambulatory Visit: Payer: Medicare HMO | Admitting: Family Medicine

## 2022-01-18 DIAGNOSIS — H353131 Nonexudative age-related macular degeneration, bilateral, early dry stage: Secondary | ICD-10-CM | POA: Diagnosis not present

## 2022-01-18 DIAGNOSIS — Z0100 Encounter for examination of eyes and vision without abnormal findings: Secondary | ICD-10-CM | POA: Diagnosis not present

## 2022-02-10 ENCOUNTER — Other Ambulatory Visit: Payer: Self-pay | Admitting: Family Medicine

## 2022-02-12 NOTE — Telephone Encounter (Signed)
Requested Prescriptions  Pending Prescriptions Disp Refills  . thyroid (ARMOUR THYROID) 60 MG tablet 135 tablet 1    Sig: TAKE 1 & 1/2 TABLETS FOUR TIMES A WEEK AND TAKE 1 TABLET THREE TIMES A WEEK     Endocrinology:  Hypothyroid Agents Passed - 02/10/2022 12:18 PM      Passed - TSH in normal range and within 360 days    TSH  Date Value Ref Range Status  09/19/2021 2.00 0.40 - 4.50 mIU/L Final         Passed - Valid encounter within last 12 months    Recent Outpatient Visits          4 months ago Hypothyroidism, unspecified type   [place] Susy Frizzle, MD   8 months ago Decreased hearing of left ear   [place] Eulogio Bear, NP   1 year ago [place] Dennard Schaumann Cammie Mcgee, MD   1 year ago Neck pain   [place] Dennard Schaumann Cammie Mcgee, MD   1 year ago Hypothyroidism, unspecified type   [place] Pickard, Cammie Mcgee, MD

## 2022-02-16 ENCOUNTER — Inpatient Hospital Stay
Admission: EM | Admit: 2022-02-16 | Discharge: 2022-02-20 | DRG: 481 | Disposition: A | Discharge status: Skilled Nursing Facility | Payer: Medicare HMO | Attending: Osteopathic Medicine | Admitting: Osteopathic Medicine

## 2022-02-16 ENCOUNTER — Emergency Department: Payer: Medicare HMO

## 2022-02-16 ENCOUNTER — Other Ambulatory Visit: Payer: Self-pay

## 2022-02-16 DIAGNOSIS — R55 Syncope and collapse: Secondary | ICD-10-CM | POA: Diagnosis not present

## 2022-02-16 DIAGNOSIS — Z85038 Personal history of other malignant neoplasm of large intestine: Secondary | ICD-10-CM

## 2022-02-16 DIAGNOSIS — W19XXXA Unspecified fall, initial encounter: Secondary | ICD-10-CM | POA: Diagnosis not present

## 2022-02-16 DIAGNOSIS — R739 Hyperglycemia, unspecified: Secondary | ICD-10-CM | POA: Diagnosis present

## 2022-02-16 DIAGNOSIS — S72144A Nondisplaced intertrochanteric fracture of right femur, initial encounter for closed fracture: Secondary | ICD-10-CM | POA: Diagnosis not present

## 2022-02-16 DIAGNOSIS — K219 Gastro-esophageal reflux disease without esophagitis: Secondary | ICD-10-CM | POA: Diagnosis not present

## 2022-02-16 DIAGNOSIS — R001 Bradycardia, unspecified: Secondary | ICD-10-CM | POA: Diagnosis not present

## 2022-02-16 DIAGNOSIS — I10 Essential (primary) hypertension: Secondary | ICD-10-CM | POA: Diagnosis not present

## 2022-02-16 DIAGNOSIS — M25572 Pain in left ankle and joints of left foot: Secondary | ICD-10-CM | POA: Diagnosis not present

## 2022-02-16 DIAGNOSIS — R9431 Abnormal electrocardiogram [ECG] [EKG]: Secondary | ICD-10-CM | POA: Diagnosis not present

## 2022-02-16 DIAGNOSIS — Y92009 Unspecified place in unspecified non-institutional (private) residence as the place of occurrence of the external cause: Secondary | ICD-10-CM

## 2022-02-16 DIAGNOSIS — R918 Other nonspecific abnormal finding of lung field: Secondary | ICD-10-CM | POA: Diagnosis not present

## 2022-02-16 DIAGNOSIS — M810 Age-related osteoporosis without current pathological fracture: Secondary | ICD-10-CM | POA: Diagnosis present

## 2022-02-16 DIAGNOSIS — Z23 Encounter for immunization: Secondary | ICD-10-CM

## 2022-02-16 DIAGNOSIS — E039 Hypothyroidism, unspecified: Secondary | ICD-10-CM | POA: Diagnosis not present

## 2022-02-16 DIAGNOSIS — M80051A Age-related osteoporosis with current pathological fracture, right femur, initial encounter for fracture: Secondary | ICD-10-CM | POA: Diagnosis not present

## 2022-02-16 DIAGNOSIS — Z79890 Hormone replacement therapy (postmenopausal): Secondary | ICD-10-CM | POA: Diagnosis not present

## 2022-02-16 DIAGNOSIS — E785 Hyperlipidemia, unspecified: Secondary | ICD-10-CM | POA: Diagnosis present

## 2022-02-16 DIAGNOSIS — S72141A Displaced intertrochanteric fracture of right femur, initial encounter for closed fracture: Secondary | ICD-10-CM | POA: Diagnosis present

## 2022-02-16 DIAGNOSIS — M25551 Pain in right hip: Secondary | ICD-10-CM | POA: Diagnosis not present

## 2022-02-16 DIAGNOSIS — R2689 Other abnormalities of gait and mobility: Secondary | ICD-10-CM | POA: Diagnosis not present

## 2022-02-16 DIAGNOSIS — Z8249 Family history of ischemic heart disease and other diseases of the circulatory system: Secondary | ICD-10-CM | POA: Diagnosis not present

## 2022-02-16 DIAGNOSIS — W1830XA Fall on same level, unspecified, initial encounter: Secondary | ICD-10-CM | POA: Diagnosis present

## 2022-02-16 DIAGNOSIS — Z79899 Other long term (current) drug therapy: Secondary | ICD-10-CM | POA: Diagnosis not present

## 2022-02-16 DIAGNOSIS — R778 Other specified abnormalities of plasma proteins: Secondary | ICD-10-CM | POA: Diagnosis present

## 2022-02-16 DIAGNOSIS — Z66 Do not resuscitate: Secondary | ICD-10-CM | POA: Diagnosis present

## 2022-02-16 DIAGNOSIS — R278 Other lack of coordination: Secondary | ICD-10-CM | POA: Diagnosis not present

## 2022-02-16 DIAGNOSIS — M6281 Muscle weakness (generalized): Secondary | ICD-10-CM | POA: Diagnosis not present

## 2022-02-16 DIAGNOSIS — R7989 Other specified abnormal findings of blood chemistry: Secondary | ICD-10-CM | POA: Diagnosis present

## 2022-02-16 DIAGNOSIS — R5383 Other fatigue: Secondary | ICD-10-CM | POA: Diagnosis not present

## 2022-02-16 DIAGNOSIS — Z83438 Family history of other disorder of lipoprotein metabolism and other lipidemia: Secondary | ICD-10-CM | POA: Diagnosis not present

## 2022-02-16 DIAGNOSIS — S7290XA Unspecified fracture of unspecified femur, initial encounter for closed fracture: Secondary | ICD-10-CM | POA: Diagnosis not present

## 2022-02-16 DIAGNOSIS — S72141D Displaced intertrochanteric fracture of right femur, subsequent encounter for closed fracture with routine healing: Secondary | ICD-10-CM | POA: Diagnosis not present

## 2022-02-16 DIAGNOSIS — D62 Acute posthemorrhagic anemia: Secondary | ICD-10-CM | POA: Diagnosis not present

## 2022-02-16 DIAGNOSIS — I214 Non-ST elevation (NSTEMI) myocardial infarction: Secondary | ICD-10-CM | POA: Diagnosis not present

## 2022-02-16 LAB — CBC WITH DIFFERENTIAL/PLATELET
Abs Immature Granulocytes: 0.06 10*3/uL (ref 0.00–0.07)
Basophils Absolute: 0.1 10*3/uL (ref 0.0–0.1)
Basophils Relative: 0 %
Eosinophils Absolute: 0 10*3/uL (ref 0.0–0.5)
Eosinophils Relative: 0 %
HCT: 42.8 % (ref 36.0–46.0)
Hemoglobin: 13.7 g/dL (ref 12.0–15.0)
Immature Granulocytes: 1 %
Lymphocytes Relative: 9 %
Lymphs Abs: 1.1 10*3/uL (ref 0.7–4.0)
MCH: 28.7 pg (ref 26.0–34.0)
MCHC: 32 g/dL (ref 30.0–36.0)
MCV: 89.5 fL (ref 80.0–100.0)
Monocytes Absolute: 0.4 10*3/uL (ref 0.1–1.0)
Monocytes Relative: 3 %
Neutro Abs: 11.5 10*3/uL — ABNORMAL HIGH (ref 1.7–7.7)
Neutrophils Relative %: 87 %
Platelets: 223 10*3/uL (ref 150–400)
RBC: 4.78 MIL/uL (ref 3.87–5.11)
RDW: 13.2 % (ref 11.5–15.5)
WBC: 13.2 10*3/uL — ABNORMAL HIGH (ref 4.0–10.5)
nRBC: 0 % (ref 0.0–0.2)

## 2022-02-16 LAB — COMPREHENSIVE METABOLIC PANEL
ALT: 17 U/L (ref 0–44)
AST: 26 U/L (ref 15–41)
Albumin: 4.5 g/dL (ref 3.5–5.0)
Alkaline Phosphatase: 60 U/L (ref 38–126)
Anion gap: 11 (ref 5–15)
BUN: 15 mg/dL (ref 8–23)
CO2: 29 mmol/L (ref 22–32)
Calcium: 9.6 mg/dL (ref 8.9–10.3)
Chloride: 101 mmol/L (ref 98–111)
Creatinine, Ser: 0.65 mg/dL (ref 0.44–1.00)
GFR, Estimated: >60 mL/min (ref >60)
Glucose, Bld: 122 mg/dL — ABNORMAL HIGH (ref 70–99)
Potassium: 3.7 mmol/L (ref 3.5–5.1)
Sodium: 141 mmol/L (ref 135–145)
Total Bilirubin: 0.7 mg/dL (ref 0.3–1.2)
Total Protein: 8.2 g/dL — ABNORMAL HIGH (ref 6.5–8.1)

## 2022-02-16 LAB — TROPONIN I (HIGH SENSITIVITY)
Troponin I (High Sensitivity): 33 ng/L — ABNORMAL HIGH (ref <18)
Troponin I (High Sensitivity): 225 ng/L (ref <18)

## 2022-02-16 MED ORDER — ADULT MULTIVITAMIN W/MINERALS CH
1.0000 | ORAL_TABLET | Freq: Every day | ORAL | Status: DC
  Administered 2022-02-16 – 2022-02-20 (×4): 1 via ORAL
  Filled 2022-02-16 (×4): qty 1

## 2022-02-16 MED ORDER — CEFAZOLIN SODIUM-DEXTROSE 2-4 GM/100ML-% IV SOLN
2.0000 g | INTRAVENOUS | Status: AC
  Administered 2022-02-17: 2 g via INTRAVENOUS
  Filled 2022-02-16: qty 100

## 2022-02-16 MED ORDER — THYROID 60 MG PO TABS
60.0000 mg | ORAL_TABLET | ORAL | Status: DC
  Administered 2022-02-19: 60 mg via ORAL
  Filled 2022-02-16: qty 1

## 2022-02-16 MED ORDER — VITAMIN D 25 MCG (1000 UNIT) PO TABS
2000.0000 [IU] | ORAL_TABLET | Freq: Every day | ORAL | Status: DC
  Administered 2022-02-16 – 2022-02-20 (×4): 2000 [IU] via ORAL
  Filled 2022-02-16 (×4): qty 2

## 2022-02-16 MED ORDER — PANTOPRAZOLE SODIUM 40 MG PO TBEC
40.0000 mg | DELAYED_RELEASE_TABLET | Freq: Every day | ORAL | Status: DC
  Administered 2022-02-16 – 2022-02-19 (×3): 40 mg via ORAL
  Filled 2022-02-16 (×4): qty 1

## 2022-02-16 MED ORDER — ONDANSETRON HCL 4 MG/2ML IJ SOLN
4.0000 mg | Freq: Once | INTRAMUSCULAR | Status: AC
  Administered 2022-02-16: 4 mg via INTRAVENOUS
  Filled 2022-02-16: qty 2

## 2022-02-16 MED ORDER — METHOCARBAMOL 500 MG PO TABS: 500.0000 mg | ORAL_TABLET | Freq: Four times a day (QID) | ORAL | Status: DC | PRN

## 2022-02-16 MED ORDER — MAGNESIUM OXIDE -MG SUPPLEMENT 400 (240 MG) MG PO TABS
200.0000 mg | ORAL_TABLET | Freq: Every day | ORAL | Status: DC
  Administered 2022-02-16 – 2022-02-20 (×4): 200 mg via ORAL
  Filled 2022-02-16 (×4): qty 1

## 2022-02-16 MED ORDER — SENNA 8.6 MG PO TABS
1.0000 | ORAL_TABLET | Freq: Two times a day (BID) | ORAL | Status: DC
  Administered 2022-02-16 – 2022-02-20 (×7): 8.6 mg via ORAL
  Filled 2022-02-16 (×7): qty 1

## 2022-02-16 MED ORDER — OXYCODONE HCL 5 MG PO TABS
5.0000 mg | ORAL_TABLET | ORAL | Status: DC | PRN
  Administered 2022-02-16 – 2022-02-17 (×3): 5 mg via ORAL
  Filled 2022-02-16 (×3): qty 1

## 2022-02-16 MED ORDER — VITAMIN B-12 1000 MCG PO TABS
5000.0000 ug | ORAL_TABLET | Freq: Every day | ORAL | Status: DC
  Administered 2022-02-18 – 2022-02-20 (×3): 5000 ug via ORAL
  Filled 2022-02-16 (×3): qty 5

## 2022-02-16 MED ORDER — HYDROCODONE-ACETAMINOPHEN 5-325 MG PO TABS: 1.0000 | ORAL_TABLET | Freq: Four times a day (QID) | ORAL | Status: DC | PRN

## 2022-02-16 MED ORDER — THYROID 60 MG PO TABS
90.0000 mg | ORAL_TABLET | ORAL | Status: DC
  Administered 2022-02-17 – 2022-02-20 (×3): 90 mg via ORAL
  Filled 2022-02-16 (×3): qty 1

## 2022-02-16 MED ORDER — THYROID 60 MG PO TABS: 60.0000 mg | ORAL_TABLET | Freq: Every day | ORAL | Status: DC

## 2022-02-16 MED ORDER — MORPHINE SULFATE (PF) 2 MG/ML IV SOLN: 0.5000 mg | INTRAVENOUS | Status: DC | PRN

## 2022-02-16 MED ORDER — CALCIUM CITRATE 950 (200 CA) MG PO TABS
200.0000 mg | ORAL_TABLET | Freq: Two times a day (BID) | ORAL | Status: DC
  Administered 2022-02-16 – 2022-02-20 (×7): 200 mg via ORAL
  Filled 2022-02-16 (×9): qty 1

## 2022-02-16 MED ORDER — MORPHINE SULFATE (PF) 4 MG/ML IV SOLN
4.0000 mg | Freq: Once | INTRAVENOUS | Status: AC
  Administered 2022-02-16: 4 mg via INTRAVENOUS
  Filled 2022-02-16: qty 1

## 2022-02-16 MED ORDER — AMLODIPINE BESYLATE 10 MG PO TABS
10.0000 mg | ORAL_TABLET | Freq: Every day | ORAL | Status: DC
  Administered 2022-02-18: 10 mg via ORAL
  Filled 2022-02-16 (×3): qty 1

## 2022-02-16 NOTE — ED Provider Notes (Signed)
[facility] Provider Note    Event Date/Time   First MD Initiated Contact with Patient 02/16/22 1544     (approximate)   History   Fall   HPI  JONAH NESTLE is a 88 y.o. female who reports she was walking made a turn and fell.  She does not believe she lost her balance or tripped.  She just fell she is not sure why.  Now she complains of a lot of pain in the right hip nowhere else.  No numbness or weakness in the foot.      Physical Exam   Triage Vital Signs: ED Triage Vitals  Enc Vitals Group     BP 02/16/22 1534 (!) 184/91     Pulse Rate 02/16/22 1534 81     Resp 02/16/22 1534 20     Temp 02/16/22 1534 97.9 F (36.6 C)     Temp Source 02/16/22 1534 Oral     SpO2 02/16/22 1531 98 %     Weight 02/16/22 1540 112 lb (50.8 kg)     Height 02/16/22 1540 '5\' 4"'$  (1.626 m)     Head Circumference --      Peak Flow --      Pain Score 02/16/22 1535 7     Pain Loc --      Pain Edu? --      Excl. in [place]? --     Most recent vital signs: Vitals:   02/16/22 1534 02/16/22 1730  BP: (!) 184/91 121/74  Pulse: 81 75  Resp: 20 19  Temp: 97.9 F (36.6 C) 98.3 F (36.8 C)  SpO2: 99% 94%     General: Awake, no distress.  Head normocephalic atraumatic Neck is supple CV:  Good peripheral perfusion.  Heart regular rate and rhythm no audible murmurs Resp:  Normal effort.  Lungs sound clear Abd:  No distention.  Nontender Extremities: Right hip is tender to palpation especially slightly posteriorly.  Good capillary refill and sensation in the feet bilaterally.   ED Results / Procedures / Treatments   Labs (all labs ordered are listed, but only abnormal results are displayed) Labs Reviewed  COMPREHENSIVE METABOLIC PANEL - Abnormal; Notable for the following components:      Result Value   Glucose, Bld 122 (*)    Total Protein 8.2 (*)    All other components within normal limits  CBC WITH DIFFERENTIAL/PLATELET - Abnormal; Notable for the following  components:   WBC 13.2 (*)    Neutro Abs 11.5 (*)    All other components within normal limits  TROPONIN I (HIGH SENSITIVITY) - Abnormal; Notable for the following components:   Troponin I (High Sensitivity) 33 (*)    All other components within normal limits  TROPONIN I (HIGH SENSITIVITY)     EKG  EKG read and interpreted by me shows normal sinus rhythm rate of 70 normal axis no   RADIOLOGY X-ray of the chest read by radiology reviewed and interpreted by me does not show any acute changes X-ray of the right hip read by radiology reviewed and interpreted by me I cannot rule out neither can the radiologist rule out a fracture because of the positioning of the hip.  We will get a CT as suggested.  I discussed with patient family the fact that if the CT is indeterminate we may have to get an MRI.  CT will be quicker and easier however. CT of the hip reviewed by me shows a  somewhat comminuted intertrochanteric fracture of the right hip we will await radiologist reading. PROCEDURES:  Critical Care performed: Critical care time 20 minutes.  This includes speaking with the patient and her family members a couple times and then speaking with the hospitalist reviewing the studies and of course speaking with the radiologist.  Procedures   MEDICATIONS ORDERED IN ED: Medications  morphine (PF) 4 MG/ML injection 4 mg (4 mg Intravenous Given 02/16/22 1723)  ondansetron (ZOFRAN) injection 4 mg (4 mg Intravenous Given 02/16/22 1723)     IMPRESSION / MDM / ASSESSMENT AND PLAN / ED COURSE  I reviewed the triage vital signs and the nursing notes. Patient with a hip fracture appears to me to be a comminuted intertrochanteric fracture without a whole lot of displacement at all.  Radiologist report is pending.  Patient does not appear to be taking any blood thinners at all.  The hospital doc to admit the patient and Dr. Christia Reading will work on her I expect tomorrow.   Patient's presentation is most  consistent with acute presentation with potential threat to life or bodily function.  The patient is on the cardiac monitor to evaluate for evidence of arrhythmia and/or significant heart rate changes.  None have been seen      FINAL CLINICAL IMPRESSION(S) / ED DIAGNOSES   Final diagnoses:  Fall, initial encounter  Closed nondisplaced intertrochanteric fracture of right femur, initial encounter ([place])     Rx / DC Orders   ED Discharge Orders     None        Note:  This document was prepared using Dragon voice recognition software and may include unintentional dictation errors.   Nena Polio, MD 02/16/22 [phone]

## 2022-02-16 NOTE — ED Triage Notes (Signed)
Pt coming for fall from home, aaox4, pain to right side hip, no blood thinners.

## 2022-02-16 NOTE — Assessment & Plan Note (Signed)
Stable.  Continue PPI. 

## 2022-02-16 NOTE — Assessment & Plan Note (Signed)
Patient noted to have elevated troponin levels but denies having any chest pain or shortness of breath Twelve-lead EKG does not show any ST or T wave abnormalities Most likely related to hypertensive urgency when patient arrived to ER We will cycle cardiac enzymes

## 2022-02-16 NOTE — ED Notes (Signed)
Pt taken to CT.

## 2022-02-16 NOTE — Assessment & Plan Note (Addendum)
Status post internal fixation.  Monitoring acute blood loss anemia.  Will need skilled nursing.  Pain appears to be moderately well controlled.

## 2022-02-16 NOTE — ED Notes (Signed)
2030 - RN attempted to page hospitalist with critical value. 2040 - Unit secretary paged hospitalist.  2045 - MD returned page via telephone.  MD notified of critical troponin of 225 w/ delta of 188.  MD notified that pt is no longer in ED and given new room number.

## 2022-02-16 NOTE — H&P (Signed)
History and Physical    Patient: Joanne Gomez QQV:[id number] DOB: 1930/09/22 DOA: 02/16/2022 DOS: the patient was seen and examined on 02/16/2022 PCP: Susy Frizzle, MD  Patient coming from: ALF  Chief Complaint:  Chief Complaint  Patient presents with   Fall   HPI: Joanne Gomez is a 88 y.o. female with medical history significant for hypothyroidism, hypertension, hiatal hernia with GERD who presents to the emergency room via EMS for evaluation of right hip pain following a fall. Patient states that she was in her usual state of health and was getting ready to go to the dining hall to get some food when she made a turn and found herself on the ground.  She is unsure what happened but denies feeling dizzy or lightheaded prior to the fall and denies any loss of consciousness.  She also denies any head trauma.  At baseline she ambulates without an assist device.  She was unable to bear weight on her right leg following the fall due to severe pain and so she called EMS and was brought into the ER for further evaluation. She denies having any chest pain, no shortness of breath, no nausea, no vomiting, no abdominal pain, no fever, no chills, no urinary symptoms, no headache, no blurred vision or any focal deficits. CT scan of the right hip shows an acute comminuted intertrochanteric fracture of the proximal right femur. She will be admitted to the hospital for further evaluation   Review of Systems: As mentioned in the history of present illness. All other systems reviewed and are negative. Past Medical History:  Diagnosis Date   Cancer ([place]) 11/2007   colon stage II   Diverticula of colon    GERD (gastroesophageal reflux disease)    H/O colon cancer, stage II 11/15/2011   HH (hiatus hernia)    History of hiatal hernia    HOH (hard of hearing)    MILD LOSS   Hyperlipidemia    Hypothyroidism    Osteoporosis    Pneumonia    Past Surgical History:  Procedure Laterality Date    ABDOMINAL HYSTERECTOMY     APPENDECTOMY     CATARACT EXTRACTION W/PHACO Right 08/30/2015   Procedure: CATARACT EXTRACTION PHACO AND INTRAOCULAR LENS PLACEMENT ([place]);  Surgeon: Birder Robson, MD;  Location: [facility] ORS;  Service: Ophthalmology;  Laterality: Right;   [place]    00:40.8 AP%  15.7 CDE    6.42   fluid pack lot #[id number]  exp 02/24/2017   CATARACT EXTRACTION W/PHACO Left 09/20/2015   Procedure: CATARACT EXTRACTION PHACO AND INTRAOCULAR LENS PLACEMENT ([facility]);  Surgeon: Birder Robson, MD;  Location: [facility] ORS;  Service: Ophthalmology;  Laterality: Left;  [place] 44.8 AP% 19.6 CDE 8.77 FLUID PACK LOT # [id number]   CHOLECYSTECTOMY     ELBOW SURGERY     right    hemocolectomy  2009   VAGINAL PROLAPSE REPAIR     Social History:  reports that she has never smoked. She has never used smokeless tobacco. She reports current alcohol use. She reports that she does not use drugs.  No Known Allergies  Family History  Problem Relation Age of Onset   Heart attack Sister 1   Hyperlipidemia Sister    Hypertension Sister    Breast cancer Neg Hx     Prior to Admission medications   Medication Sig Start Date End Date Taking? Authorizing Provider  amLODipine (NORVASC) 10 MG tablet Take 1 tablet (10 mg total) by mouth daily. 12/28/21  Susy Frizzle, MD  amLODipine (NORVASC) 2.5 MG tablet Take 1 tablet (2.5 mg total) by mouth daily. 09/20/20   Minna Merritts, MD  CALCIUM CITRATE PO Take 1 tablet by mouth 2 (two) times daily. '630mg'$     [provider]  Cholecalciferol (VITAMIN D3) 2000 units TABS Take 1 tablet by mouth daily.    [provider]  esomeprazole (NEXIUM) 40 MG capsule Take 40 mg by mouth daily as needed.    [provider]  Fluocinolone Acetonide 0.01 % OIL Place 3 drops in ear(s) 2 (two) times daily as needed. 02/17/19   Susy Frizzle, MD  Lifitegrast Shirley Friar) 5 % SOLN Xiidra 5 % eye drops in a dropperette    [provider]  Magnesium 250 MG TABS  Take 1 tablet by mouth daily.    [provider]  Multiple Vitamin (MULTIVITAMIN) tablet Take 1 tablet by mouth daily.    [provider]  nystatin ointment (MYCOSTATIN) Apply 1 application. topically 2 (two) times daily as needed. 09/21/21   Susy Frizzle, MD  thyroid (ARMOUR THYROID) 60 MG tablet TAKE 1 & 1/2 TABLETS FOUR TIMES A WEEK AND TAKE 1 TABLET THREE TIMES A WEEK 02/12/22   Susy Frizzle, MD  traMADol (ULTRAM) 50 MG tablet Take 50 mg by mouth 3 (three) times daily as needed. 02/16/22   [provider]  triamcinolone (NASACORT) 55 MCG/ACT AERO nasal inhaler 2 sprays 2 (two) times daily. 01/04/22   [provider]  vitamin B-12 (CYANOCOBALAMIN) 1000 MCG tablet Take 5,000 mcg by mouth daily.    [provider]  vitamin E 100 UNIT capsule Take 200 Units by mouth daily.    [provider]    Physical Exam: Vitals:   02/16/22 1540 02/16/22 1730 02/16/22 1800 02/16/22 1930  BP:  121/74 (!) 158/66 (!) 149/77  Pulse:  75 71 82  Resp:  '19 14 19  '$ Temp:  98.3 F (36.8 C)  98.5 F (36.9 C)  TempSrc:  Oral  Oral  SpO2:  94% 97% 97%  Weight: 50.8 kg     Height: '5\' 4"'$  (1.626 m)      Physical Exam Vitals and nursing note reviewed.  Constitutional:      Appearance: Normal appearance.  HENT:     Head: Normocephalic and atraumatic.     Nose: Nose normal.     Mouth/Throat:     Mouth: Mucous membranes are moist.  Eyes:     Conjunctiva/sclera: Conjunctivae normal.  Cardiovascular:     Rate and Rhythm: Normal rate and regular rhythm.  Pulmonary:     Effort: Pulmonary effort is normal.     Breath sounds: Normal breath sounds.  Abdominal:     General: Abdomen is flat. Bowel sounds are normal.     Palpations: Abdomen is soft.  Musculoskeletal:     Cervical back: Normal range of motion and neck supple.     Comments: Decreased range of motion right hip  Skin:    General: Skin is warm and dry.  Neurological:     General: No focal  deficit present.     Mental Status: She is alert.  Psychiatric:        Mood and Affect: Mood normal.        Behavior: Behavior normal.     Data Reviewed: Relevant notes from primary care and specialist visits, past discharge summaries as available in EHR, including Care Everywhere. Prior diagnostic testing as pertinent to current  admission diagnoses Updated medications and problem lists for reconciliation ED course, including vitals, labs, imaging, treatment and response to treatment Triage notes, nursing and pharmacy notes and ED provider's notes Notable results as noted in HPI Labs reviewed.  Sodium 141, potassium 3.7, chloride 101, bicarb 29, glucose 122, BUN 15, creatinine 0.65, calcium 9.6, total protein 8.2, albumin 4.5, AST 26, ALT 17, alkaline phosphatase 60, total bilirubin 0.7, troponin 33, white count 13.2, hemoglobin 13.7, platelet count 223 CT scan of the right hip without contrast shows Acute comminuted intertrochanteric fracture of the proximal right femur Chest x-ray reviewed by me shows Chronic-appearing increased lung markings without acute or active cardiopulmonary disease. Twelve-lead EKG reviewed by me shows sinus rhythm There are no new results to review at this time.  Assessment and Plan: * Intertrochanteric fracture of right femur, closed, initial encounter ([place]) Following a fall of unclear etiology No loss of consciousness, dizziness or lightheadedness prior to the fall Noted to have an acute comminuted intertrochanteric fracture of the proximal right femur. Immobilize right lower extremity Pain control Muscle relaxants Orthopedic surgery consult  Elevated troponin Patient noted to have elevated troponin levels but denies having any chest pain or shortness of breath Twelve-lead EKG does not show any ST or T wave abnormalities Most likely related to hypertensive urgency when patient arrived to ER We will cycle cardiac enzymes  Essential  hypertension Blood pressure is stable Continue amlodipine  GERD (gastroesophageal reflux disease) Stable Continue PPI  Hypothyroidism Stable Continue Synthroid      Advance Care Planning:   Code Status: DNR   Consults: Orthopedic surgery  Family Communication: Greater than 50% of time was spent discussing patient's condition and plan of care with her and her daughter at the bedside.  All questions and concerns have been addressed.  They verbalized understanding and agree with the plan.  CODE STATUS was discussed and she is a DO NOT RESUSCITATE.  Severity of Illness: The appropriate patient status for this patient is INPATIENT. Inpatient status is judged to be reasonable and necessary in order to provide the required intensity of service to ensure the patient's safety. The patient's presenting symptoms, physical exam findings, and initial radiographic and laboratory data in the context of their chronic comorbidities is felt to place them at high risk for further clinical deterioration. Furthermore, it is not anticipated that the patient will be medically stable for discharge from the hospital within 2 midnights of admission.   * I certify that at the point of admission it is my clinical judgment that the patient will require inpatient hospital care spanning beyond 2 midnights from the point of admission due to high intensity of service, high risk for further deterioration and high frequency of surveillance required.*  Author: Collier Bullock, MD 02/16/2022 8:05 PM  For on call review [email or web address].

## 2022-02-16 NOTE — Assessment & Plan Note (Addendum)
Blood pressure initially elevated on admission, likely secondary to pain.  Since then has been stable.  Continue to monitor.  Continue home medications.

## 2022-02-16 NOTE — Assessment & Plan Note (Signed)
Stable Continue Synthroid 

## 2022-02-16 NOTE — Consult Note (Signed)
ORTHOPAEDIC CONSULTATION  REQUESTING PHYSICIAN: Nena Polio, MD  Chief Complaint: Right hip pain s/p fall  HPI: Joanne Gomez is a 88 y.o. female who complains of right hip pain status post fall at home today.  Patient does not recall the exact mechanism of the fall.  She states she was walking between 2 rooms in her house and fell down.  She does not recall tripping and does not believe that she had a syncopal episode.  She is seen in the ER today with her daughter at the bedside.  Patient denies other injuries.  Past Medical History:  Diagnosis Date   Cancer ([place]) 11/2007   colon stage II   Diverticula of colon    GERD (gastroesophageal reflux disease)    H/O colon cancer, stage II 11/15/2011   HH (hiatus hernia)    History of hiatal hernia    HOH (hard of hearing)    MILD LOSS   Hyperlipidemia    Hypothyroidism    Osteoporosis    Pneumonia    Past Surgical History:  Procedure Laterality Date   ABDOMINAL HYSTERECTOMY     APPENDECTOMY     CATARACT EXTRACTION W/PHACO Right 08/30/2015   Procedure: CATARACT EXTRACTION PHACO AND INTRAOCULAR LENS PLACEMENT ([place]);  Surgeon: Birder Robson, MD;  Location: [facility] ORS;  Service: Ophthalmology;  Laterality: Right;   [place]    00:40.8 AP%  15.7 CDE    6.42   fluid pack lot #[id number] H  exp 02/24/2017   CATARACT EXTRACTION W/PHACO Left 09/20/2015   Procedure: CATARACT EXTRACTION PHACO AND INTRAOCULAR LENS PLACEMENT ([facility]);  Surgeon: Birder Robson, MD;  Location: [facility] ORS;  Service: Ophthalmology;  Laterality: Left;  [place] 44.8 AP% 19.6 CDE 8.77 FLUID PACK LOT # [id number]   CHOLECYSTECTOMY     ELBOW SURGERY     right    hemocolectomy  2009   VAGINAL PROLAPSE REPAIR     Social History   Socioeconomic History   Marital status: Married    Spouse name: Not on file   Number of children: Not on file   Years of education: Not on file   Highest education level: Not on file  Occupational History   Not on file  Tobacco Use   Smoking  status: Never   Smokeless tobacco: Never  Vaping Use   Vaping Use: Never used  Substance and Sexual Activity   Alcohol use: Yes    Comment: occ   Drug use: No   Sexual activity: Yes    Comment: Married to [place], retired.  Other Topics Concern   Not on file  Social History Narrative   Not on file   Social Determinants of Health   Financial Resource Strain: Low Risk  (12/01/2020)   Overall Financial Resource Strain (CARDIA)    Difficulty of Paying Living Expenses: Not hard at all  Food Insecurity: No Food Insecurity (12/01/2020)   Hunger Vital Sign    Worried About Running Out of Food in the Last Year: Never true    [place] in the Last Year: Never true  Transportation Needs: No Transportation Needs (12/01/2020)   PRAPARE - Hydrologist (Medical): No    Lack of Transportation (Non-Medical): No  Physical Activity: Insufficiently Active (12/01/2020)   Exercise Vital Sign    Days of Exercise per Week: 3 days    Minutes of Exercise per Session: 20 min  Stress: No Stress Concern Present (12/01/2020)   [employer] - Occupational Stress Questionnaire    Feeling of Stress : Not at all  Social Connections: Moderately Integrated (12/01/2020)   Social Connection and Isolation Panel [NHANES]    Frequency of Communication with Friends and Family: More than three times a week    Frequency of Social Gatherings with Friends and Family: Three times a week    Attends Religious Services: 1 to 4 times per year    Active Member of Clubs or Organizations: No    Attends Archivist Meetings: Never    Marital Status: Married   Family History  Problem Relation Age of Onset   Heart attack Sister 21   Hyperlipidemia Sister    Hypertension Sister    Breast cancer Neg Hx    No Known Allergies Prior to Admission medications   Medication Sig Start Date End Date Taking? Authorizing Provider  amLODipine (NORVASC) 10 MG tablet Take 1  tablet (10 mg total) by mouth daily. 12/28/21   Susy Frizzle, MD  amLODipine (NORVASC) 2.5 MG tablet Take 1 tablet (2.5 mg total) by mouth daily. 09/20/20   Minna Merritts, MD  CALCIUM CITRATE PO Take 1 tablet by mouth 2 (two) times daily. '630mg'$     [provider]  Cholecalciferol (VITAMIN D3) 2000 units TABS Take 1 tablet by mouth daily.    [provider]  esomeprazole (NEXIUM) 40 MG capsule Take 40 mg by mouth daily as needed.    [provider]  Fluocinolone Acetonide 0.01 % OIL Place 3 drops in ear(s) 2 (two) times daily as needed. 02/17/19   Susy Frizzle, MD  Lifitegrast Shirley Friar) 5 % SOLN Xiidra 5 % eye drops in a dropperette    [provider]  Magnesium 250 MG TABS Take 1 tablet by mouth daily.    [provider]  Multiple Vitamin (MULTIVITAMIN) tablet Take 1 tablet by mouth daily.    [provider]  nystatin ointment (MYCOSTATIN) Apply 1 application. topically 2 (two) times daily as needed. 09/21/21   Susy Frizzle, MD  thyroid (ARMOUR THYROID) 60 MG tablet TAKE 1 & 1/2 TABLETS FOUR TIMES A WEEK AND TAKE 1 TABLET THREE TIMES A WEEK 02/12/22   Susy Frizzle, MD  vitamin B-12 (CYANOCOBALAMIN) 1000 MCG tablet Take 5,000 mcg by mouth daily.    [provider]  vitamin E 100 UNIT capsule Take 200 Units by mouth daily.    [provider]   CT Hip Right Wo Contrast  Result Date: 02/16/2022 CLINICAL DATA:  Fall.  Right hip pain EXAM: CT OF THE RIGHT HIP WITHOUT CONTRAST TECHNIQUE: Multidetector CT imaging of the right hip was performed according to the standard protocol. Multiplanar CT image reconstructions were also generated. RADIATION DOSE REDUCTION: This exam was performed according to the departmental dose-optimization program which includes automated exposure control, adjustment of the mA and/or kV according to patient size and/or use of iterative reconstruction technique. COMPARISON:  X-ray 02/16/2022  FINDINGS: Bones/Joint/Cartilage Acute comminuted intertrochanteric fracture of the proximal right femur. Minimal displacement of the greater trochanteric fracture component. Fracture is otherwise relatively nondisplaced. No dislocation of the right hip. 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 Included portion of the right hemipelvis is intact without fracture or diastasis. Bones are demineralized. No lytic or sclerotic bone lesion is evident. Ligaments Suboptimally assessed by CT. Muscles and Tendons No acute musculotendinous abnormality by CT. Soft tissues No fluid collection or hematoma within the soft tissues. No right inguinal lymphadenopathy. IMPRESSION: Acute  comminuted intertrochanteric fracture of the proximal right femur, as above. Electronically Signed   By: Davina Poke D.O.   On: 02/16/2022 18:33   DG Chest 1 View  Result Date: 02/16/2022 CLINICAL DATA:  Status post fall on the right-side. EXAM: CHEST  1 VIEW COMPARISON:  Oct 19, 2019 FINDINGS: The heart size and mediastinal contours are within normal limits. The lungs are hyperinflated. Mild, diffuse, chronic appearing increased lung markings are seen. There is no evidence of acute infiltrate, pleural effusion or pneumothorax. The visualized skeletal structures are unremarkable. IMPRESSION: Chronic-appearing increased lung markings without acute or active cardiopulmonary disease. Electronically Signed   By: Virgina Norfolk M.D.   On: 02/16/2022 17:22   DG Hip Unilat W or Wo Pelvis 2-3 Views Right  Result Date: 02/16/2022 CLINICAL DATA:  Right hip pain after falling. EXAM: DG HIP (WITH OR WITHOUT PELVIS) 2-3V RIGHT COMPARISON:  Abdominal radiographs 07/01/2018. Pelvic CT 11/12/2011. FINDINGS: The bones are demineralized. The right hip is flexed and adducted with resulting suboptimal evaluation of the femoral neck and trochanteric region. No definite acute femur fracture is confirmed. No evidence of dislocation or pelvic  fracture. IMPRESSION: Limited examination due to positioning. No definite acute fracture identified. Given the limitations of this study, further evaluation with CT of the right hip is recommended. Electronically Signed   By: Richardean Sale M.D.   On: 02/16/2022 17:11    Positive ROS: All other systems have been reviewed and were otherwise negative with the exception of those mentioned in the HPI and as above.  Physical Exam: General: Alert, no acute distress  MUSCULOSKELETAL: Right lower extremity: Patient still has her pants and shoes on.  There is no significant swelling in the right thigh and the thigh compartments are soft and compressible.  Distally she has palpable pedal pulses and intact sensation to light touch.  She can flex and extend her toes.  She does not have significant shortening or external rotation of the right lower extremity.   Assessment: Right comminuted intertrochanteric hip fracture  Plan: I reviewed the patient's x-rays and CT scan of the right hip personally.  I explained the history of the fracture to the patient and her daughter.  I am recommending intramedullary fixation for her fracture.  Discussed the details of the operation as well as the postoperative course with them.  I discussed the risks and benefits of surgery. The risks include but are not limited to infection, bleeding, nerve or blood vessel injury, joint stiffness or loss of motion, persistent pain, weakness or instability, malunion, nonunion and hardware failure and the need for further surgery. Medical risks include but are not limited to DVT and pulmonary embolism, myocardial infarction, stroke, pneumonia, respiratory failure and death. Patient understood these risks and wished to proceed.  Patient is being admitted to the hospitalist service.  She will be n.p.o. after midnight.  Surgery is scheduled for tomorrow pending medical clearance.  Patient should not receive anticoagulation tonight in preparation  for surgery tomorrow.     Thornton Park, MD    02/16/2022 7:10 PM

## 2022-02-17 ENCOUNTER — Inpatient Hospital Stay: Payer: Medicare HMO

## 2022-02-17 ENCOUNTER — Other Ambulatory Visit: Payer: Self-pay

## 2022-02-17 ENCOUNTER — Inpatient Hospital Stay: Payer: Medicare HMO | Admitting: Certified Registered"

## 2022-02-17 ENCOUNTER — Encounter
Admission: EM | Disposition: A | Discharge status: Skilled Nursing Facility | Payer: Self-pay | Source: Home / Self Care | Attending: Internal Medicine

## 2022-02-17 ENCOUNTER — Inpatient Hospital Stay
Admit: 2022-02-17 | Discharge: 2022-02-17 | Disposition: A | Discharge status: Home / Self Care | Payer: Medicare HMO | Attending: Internal Medicine | Admitting: Internal Medicine

## 2022-02-17 DIAGNOSIS — E039 Hypothyroidism, unspecified: Secondary | ICD-10-CM | POA: Diagnosis not present

## 2022-02-17 DIAGNOSIS — S72141A Displaced intertrochanteric fracture of right femur, initial encounter for closed fracture: Secondary | ICD-10-CM | POA: Diagnosis not present

## 2022-02-17 DIAGNOSIS — R778 Other specified abnormalities of plasma proteins: Secondary | ICD-10-CM

## 2022-02-17 DIAGNOSIS — I10 Essential (primary) hypertension: Secondary | ICD-10-CM | POA: Diagnosis not present

## 2022-02-17 HISTORY — PX: INTRAMEDULLARY (IM) NAIL INTERTROCHANTERIC: SHX5875

## 2022-02-17 LAB — ECHOCARDIOGRAM COMPLETE
AR max vel: 1.55 cm2
AV Peak grad: 8.1 mmHg
Ao pk vel: 1.42 m/s
Area-P 1/2: 3.28 cm2
Calc EF: 60.7 %
Height: 64 in
S' Lateral: 1.7 cm
Single Plane A2C EF: 57.1 %
Single Plane A4C EF: 66.1 %
Weight: 1792 oz

## 2022-02-17 LAB — BASIC METABOLIC PANEL
Anion gap: 7 (ref 5–15)
BUN: 16 mg/dL (ref 8–23)
CO2: 29 mmol/L (ref 22–32)
Calcium: 9.5 mg/dL (ref 8.9–10.3)
Chloride: 105 mmol/L (ref 98–111)
Creatinine, Ser: 0.71 mg/dL (ref 0.44–1.00)
GFR, Estimated: >60 mL/min (ref >60)
Glucose, Bld: 136 mg/dL — ABNORMAL HIGH (ref 70–99)
Potassium: 4.2 mmol/L (ref 3.5–5.1)
Sodium: 141 mmol/L (ref 135–145)

## 2022-02-17 LAB — CBC
HCT: 36.7 % (ref 36.0–46.0)
Hemoglobin: 11.7 g/dL — ABNORMAL LOW (ref 12.0–15.0)
MCH: 28.5 pg (ref 26.0–34.0)
MCHC: 31.9 g/dL (ref 30.0–36.0)
MCV: 89.5 fL (ref 80.0–100.0)
Platelets: 169 10*3/uL (ref 150–400)
RBC: 4.1 MIL/uL (ref 3.87–5.11)
RDW: 13.4 % (ref 11.5–15.5)
WBC: 10.4 10*3/uL (ref 4.0–10.5)
nRBC: 0 % (ref 0.0–0.2)

## 2022-02-17 LAB — TROPONIN I (HIGH SENSITIVITY)
Troponin I (High Sensitivity): 356 ng/L (ref <18)
Troponin I (High Sensitivity): 353 ng/L (ref <18)
Troponin I (High Sensitivity): 294 ng/L (ref <18)
Troponin I (High Sensitivity): 221 ng/L (ref <18)

## 2022-02-17 LAB — PROTIME-INR
INR: 1.1 (ref 0.8–1.2)
Prothrombin Time: 13.9 seconds (ref 11.4–15.2)

## 2022-02-17 LAB — APTT: aPTT: 30 seconds (ref 24–36)

## 2022-02-17 LAB — SURGICAL PCR SCREEN
MRSA, PCR: NEGATIVE
Staphylococcus aureus: NEGATIVE

## 2022-02-17 LAB — HEPARIN LEVEL (UNFRACTIONATED): Heparin Unfractionated: 0.43 IU/mL (ref 0.30–0.70)

## 2022-02-17 SURGERY — FIXATION, FRACTURE, INTERTROCHANTERIC, WITH INTRAMEDULLARY ROD
Anesthesia: General | Laterality: Right

## 2022-02-17 MED ORDER — PHENYLEPHRINE HCL-NACL 20-0.9 MG/250ML-% IV SOLN
INTRAVENOUS | Status: AC
  Filled 2022-02-17: qty 250

## 2022-02-17 MED ORDER — PHENYLEPHRINE HCL (PRESSORS) 10 MG/ML IV SOLN
INTRAVENOUS | Status: DC | PRN
  Administered 2022-02-17: 80 ug via INTRAVENOUS
  Administered 2022-02-17: 40 ug via INTRAVENOUS
  Administered 2022-02-17 (×3): 160 ug via INTRAVENOUS

## 2022-02-17 MED ORDER — TRAMADOL HCL 50 MG PO TABS
50.0000 mg | ORAL_TABLET | Freq: Four times a day (QID) | ORAL | Status: DC
  Administered 2022-02-17 – 2022-02-20 (×10): 50 mg via ORAL
  Filled 2022-02-17 (×10): qty 1

## 2022-02-17 MED ORDER — ACETAMINOPHEN 10 MG/ML IV SOLN: 1000.0000 mg | Freq: Once | INTRAVENOUS | Status: DC | PRN

## 2022-02-17 MED ORDER — ACETAMINOPHEN 500 MG PO TABS
500.0000 mg | ORAL_TABLET | Freq: Four times a day (QID) | ORAL | Status: AC
  Administered 2022-02-17 – 2022-02-18 (×4): 500 mg via ORAL
  Filled 2022-02-17 (×4): qty 1

## 2022-02-17 MED ORDER — HYDROCODONE-ACETAMINOPHEN 5-325 MG PO TABS
1.0000 | ORAL_TABLET | ORAL | Status: DC | PRN
  Administered 2022-02-18 (×2): 1 via ORAL
  Administered 2022-02-20: 2 via ORAL
  Filled 2022-02-17 (×2): qty 1
  Filled 2022-02-17: qty 2
  Filled 2022-02-17: qty 1
  Filled 2022-02-17: qty 2

## 2022-02-17 MED ORDER — METOPROLOL TARTRATE 25 MG PO TABS
12.5000 mg | ORAL_TABLET | Freq: Two times a day (BID) | ORAL | Status: DC
  Administered 2022-02-17 – 2022-02-19 (×6): 12.5 mg via ORAL
  Filled 2022-02-17 (×9): qty 1

## 2022-02-17 MED ORDER — ONDANSETRON HCL 4 MG/2ML IJ SOLN
INTRAMUSCULAR | Status: DC | PRN
  Administered 2022-02-17: 4 mg via INTRAVENOUS

## 2022-02-17 MED ORDER — PROPOFOL 10 MG/ML IV BOLUS
INTRAVENOUS | Status: DC | PRN
  Administered 2022-02-17: 90 mg via INTRAVENOUS

## 2022-02-17 MED ORDER — LIDOCAINE HCL (CARDIAC) PF 100 MG/5ML IV SOSY
PREFILLED_SYRINGE | INTRAVENOUS | Status: DC | PRN
  Administered 2022-02-17: 60 mg via INTRAVENOUS

## 2022-02-17 MED ORDER — SUGAMMADEX SODIUM 200 MG/2ML IV SOLN
INTRAVENOUS | Status: DC | PRN
  Administered 2022-02-17: 200 mg via INTRAVENOUS

## 2022-02-17 MED ORDER — OXYCODONE HCL 5 MG PO TABS: 5.0000 mg | ORAL_TABLET | Freq: Once | ORAL | Status: DC | PRN

## 2022-02-17 MED ORDER — HEPARIN BOLUS VIA INFUSION
3000.0000 [IU] | Freq: Once | INTRAVENOUS | Status: AC
  Administered 2022-02-17: 3000 [IU] via INTRAVENOUS
  Filled 2022-02-17: qty 3000

## 2022-02-17 MED ORDER — MENTHOL 3 MG MT LOZG: 1.0000 | LOZENGE | OROMUCOSAL | Status: DC | PRN

## 2022-02-17 MED ORDER — FENTANYL CITRATE (PF) 100 MCG/2ML IJ SOLN
25.0000 ug | INTRAMUSCULAR | Status: DC | PRN
  Administered 2022-02-17: 25 ug via INTRAVENOUS

## 2022-02-17 MED ORDER — ONDANSETRON HCL 4 MG PO TABS: 4.0000 mg | ORAL_TABLET | Freq: Four times a day (QID) | ORAL | Status: DC | PRN

## 2022-02-17 MED ORDER — PHENYLEPHRINE HCL-NACL 20-0.9 MG/250ML-% IV SOLN
INTRAVENOUS | Status: DC | PRN
  Administered 2022-02-17: 20 ug/min via INTRAVENOUS

## 2022-02-17 MED ORDER — ROCURONIUM BROMIDE 100 MG/10ML IV SOLN
INTRAVENOUS | Status: DC | PRN
  Administered 2022-02-17: 50 mg via INTRAVENOUS

## 2022-02-17 MED ORDER — MORPHINE SULFATE (PF) 2 MG/ML IV SOLN: 0.5000 mg | INTRAVENOUS | Status: DC | PRN

## 2022-02-17 MED ORDER — OXYCODONE HCL 5 MG/5ML PO SOLN: 5.0000 mg | Freq: Once | ORAL | Status: DC | PRN

## 2022-02-17 MED ORDER — METHOCARBAMOL 500 MG PO TABS: 500.0000 mg | ORAL_TABLET | Freq: Four times a day (QID) | ORAL | Status: DC | PRN

## 2022-02-17 MED ORDER — ONDANSETRON HCL 4 MG/2ML IJ SOLN: 4.0000 mg | Freq: Four times a day (QID) | INTRAMUSCULAR | Status: DC | PRN

## 2022-02-17 MED ORDER — HEPARIN (PORCINE) 25000 UT/250ML-% IV SOLN
650.0000 [IU]/h | INTRAVENOUS | Status: DC
  Administered 2022-02-17: 650 [IU]/h via INTRAVENOUS
  Filled 2022-02-17: qty 250

## 2022-02-17 MED ORDER — BISACODYL 10 MG RE SUPP: 10.0000 mg | Freq: Every day | RECTAL | Status: DC | PRN

## 2022-02-17 MED ORDER — LACTATED RINGERS IV SOLN: INTRAVENOUS | Status: DC | PRN

## 2022-02-17 MED ORDER — METHOCARBAMOL 1000 MG/10ML IJ SOLN: 500.0000 mg | Freq: Four times a day (QID) | INTRAVENOUS | Status: DC | PRN

## 2022-02-17 MED ORDER — ENOXAPARIN SODIUM 40 MG/0.4ML IJ SOSY
40.0000 mg | PREFILLED_SYRINGE | INTRAMUSCULAR | Status: DC
  Administered 2022-02-18 – 2022-02-20 (×3): 40 mg via SUBCUTANEOUS
  Filled 2022-02-17 (×3): qty 0.4

## 2022-02-17 MED ORDER — CEFAZOLIN SODIUM-DEXTROSE 2-4 GM/100ML-% IV SOLN
2.0000 g | Freq: Four times a day (QID) | INTRAVENOUS | Status: AC
  Administered 2022-02-17 – 2022-02-18 (×2): 2 g via INTRAVENOUS
  Filled 2022-02-17 (×2): qty 100

## 2022-02-17 MED ORDER — FENTANYL CITRATE (PF) 100 MCG/2ML IJ SOLN
INTRAMUSCULAR | Status: AC
  Filled 2022-02-17: qty 2

## 2022-02-17 MED ORDER — HYDRALAZINE HCL 20 MG/ML IJ SOLN
INTRAMUSCULAR | Status: AC
  Filled 2022-02-17: qty 1

## 2022-02-17 MED ORDER — PROPOFOL 10 MG/ML IV BOLUS
INTRAVENOUS | Status: AC
  Filled 2022-02-17: qty 20

## 2022-02-17 MED ORDER — DEXAMETHASONE SODIUM PHOSPHATE 10 MG/ML IJ SOLN
INTRAMUSCULAR | Status: DC | PRN
  Administered 2022-02-17: 10 mg via INTRAVENOUS

## 2022-02-17 MED ORDER — FENTANYL CITRATE (PF) 100 MCG/2ML IJ SOLN
INTRAMUSCULAR | Status: DC | PRN
  Administered 2022-02-17 (×3): 25 ug via INTRAVENOUS

## 2022-02-17 MED ORDER — PHENOL 1.4 % MT LIQD: 1.0000 | OROMUCOSAL | Status: DC | PRN

## 2022-02-17 MED ORDER — ONDANSETRON HCL 4 MG/2ML IJ SOLN: 4.0000 mg | Freq: Once | INTRAMUSCULAR | Status: DC | PRN

## 2022-02-17 MED ORDER — DOCUSATE SODIUM 100 MG PO CAPS
100.0000 mg | ORAL_CAPSULE | Freq: Two times a day (BID) | ORAL | Status: DC
  Administered 2022-02-17 – 2022-02-20 (×6): 100 mg via ORAL
  Filled 2022-02-17 (×6): qty 1

## 2022-02-17 MED ORDER — 0.9 % SODIUM CHLORIDE (POUR BTL) OPTIME
TOPICAL | Status: DC | PRN
  Administered 2022-02-17: 250 mL

## 2022-02-17 MED ORDER — POLYETHYLENE GLYCOL 3350 17 G PO PACK: 17.0000 g | PACK | Freq: Every day | ORAL | Status: DC | PRN

## 2022-02-17 MED ORDER — ASPIRIN 81 MG PO TBEC
81.0000 mg | DELAYED_RELEASE_TABLET | Freq: Every day | ORAL | Status: DC
  Administered 2022-02-17 – 2022-02-20 (×4): 81 mg via ORAL
  Filled 2022-02-17 (×4): qty 1

## 2022-02-17 MED ORDER — ALUM & MAG HYDROXIDE-SIMETH 200-200-20 MG/5ML PO SUSP
30.0000 mL | ORAL | Status: DC | PRN
  Administered 2022-02-19: 30 mL via ORAL
  Filled 2022-02-17: qty 30

## 2022-02-17 MED ORDER — ONDANSETRON HCL 4 MG/2ML IJ SOLN
INTRAMUSCULAR | Status: AC
  Filled 2022-02-17: qty 2

## 2022-02-17 MED ORDER — ACETAMINOPHEN 325 MG PO TABS: 325.0000 mg | ORAL_TABLET | Freq: Four times a day (QID) | ORAL | Status: DC | PRN

## 2022-02-17 MED ORDER — LACTATED RINGERS IV SOLN: INTRAVENOUS | Status: DC

## 2022-02-17 SURGICAL SUPPLY — 40 items
BIT DRILL CANN LG 4.3MM (BIT) IMPLANT
BNDG COHESIVE 6X5 TAN ST LF (GAUZE/BANDAGES/DRESSINGS) ×2 IMPLANT
DRAPE 3/4 80X56 (DRAPES) ×2 IMPLANT
DRAPE SURG 17X11 SM STRL (DRAPES) ×2 IMPLANT
DRAPE U-SHAPE 47X51 STRL (DRAPES) ×2 IMPLANT
DRILL BIT CANN LG 4.3MM (BIT) ×1
DRSG OPSITE POSTOP 3X4 (GAUZE/BANDAGES/DRESSINGS) ×2 IMPLANT
DRSG OPSITE POSTOP 4X14 (GAUZE/BANDAGES/DRESSINGS) IMPLANT
DRSG OPSITE POSTOP 4X6 (GAUZE/BANDAGES/DRESSINGS) ×1 IMPLANT
DURAPREP 26ML APPLICATOR (WOUND CARE) ×2 IMPLANT
ELECT REM PT RETURN 9FT ADLT (ELECTROSURGICAL) ×1
ELECTRODE REM PT RTRN 9FT ADLT (ELECTROSURGICAL) ×1 IMPLANT
GLOVE BIOGEL PI IND STRL 9 (GLOVE) ×1 IMPLANT
GLOVE BIOGEL PI ORTHO SZ9 (GLOVE) ×4 IMPLANT
GOWN STRL REUS TWL 2XL XL LVL4 (GOWN DISPOSABLE) ×1 IMPLANT
GOWN STRL REUS W/ TWL LRG LVL3 (GOWN DISPOSABLE) ×1 IMPLANT
GOWN STRL REUS W/TWL LRG LVL3 (GOWN DISPOSABLE) ×1
GUIDEPIN 3.2X17.5 THRD DISP (PIN) IMPLANT
GUIDEWIRE BALL NOSE 100CM (WIRE) IMPLANT
HEMOVAC 400CC 10FR (MISCELLANEOUS) IMPLANT
KIT TURNOVER CYSTO (KITS) ×1 IMPLANT
MANIFOLD NEPTUNE II (INSTRUMENTS) ×1 IMPLANT
MAT ABSORB  FLUID 56X50 GRAY (MISCELLANEOUS) ×1
MAT ABSORB FLUID 56X50 GRAY (MISCELLANEOUS) ×1 IMPLANT
NAIL HIP FRACT 130D 11X180 (Screw) IMPLANT
NS IRRIG 500ML POUR BTL (IV SOLUTION) ×1 IMPLANT
PACK HIP COMPR (MISCELLANEOUS) ×1 IMPLANT
PAD ARMBOARD 7.5X6 YLW CONV (MISCELLANEOUS) ×1 IMPLANT
SCREW BONE CORTICAL 5.0X3 (Screw) IMPLANT
SCREW LAG HIP NAIL 10.5X95 (Screw) IMPLANT
STAPLER SKIN PROX 35W (STAPLE) ×1 IMPLANT
SUCTION FRAZIER HANDLE 10FR (MISCELLANEOUS) ×1
SUCTION TUBE FRAZIER 10FR DISP (MISCELLANEOUS) ×1 IMPLANT
SUT VIC AB 0 CT1 36 (SUTURE) ×2 IMPLANT
SUT VIC AB 2-0 CT1 27 (SUTURE) ×1
SUT VIC AB 2-0 CT1 TAPERPNT 27 (SUTURE) ×1 IMPLANT
SUT VICRYL 0 AB UR-6 (SUTURE) ×1 IMPLANT
SYR 30ML LL (SYRINGE) ×1 IMPLANT
TRAP FLUID SMOKE EVACUATOR (MISCELLANEOUS) ×1 IMPLANT
WATER STERILE IRR 500ML POUR (IV SOLUTION) ×1 IMPLANT

## 2022-02-17 NOTE — Assessment & Plan Note (Signed)
Suspect a contributing factor to patient's fracture.  Continue calcium and vitamin D.

## 2022-02-17 NOTE — Progress Notes (Addendum)
Subjective:  Patient with elevated troponin upon presentation to ER.  Patient was placed on heparin drip after a bolus.  Patient was seen by Dr. Nehemiah Massed from cardiology today who cleared patient for surgery and said that heparin be stopped.  Surgery is delayed till this afternoon until 6 hours after heparin drip was stopped.  Patient seen in her hospital room today.  She denies any significant pain.  Her daughter is at the bedside.  Objective:   VITALS:   Vitals:   02/17/22 0029 02/17/22 0214 02/17/22 0510 02/17/22 1226  BP: 128/68 135/69 121/62 (!) 143/74  Pulse: 79 79 65 77  Resp: 16  16   Temp: 99 F (37.2 C)  97.8 F (36.6 C) 97.7 F (36.5 C)  TempSrc:    Oral  SpO2: 98%  98% 98%  Weight:      Height:        PHYSICAL EXAM: Left lower extremity Neurovascular intact Sensation intact distally Intact pulses distally Dorsiflexion/Plantar flexion intact No cellulitis present Compartment soft  LABS  Results for orders placed or performed during the hospital encounter of 02/16/22 (from the past 24 hour(s))  Comprehensive metabolic panel     Status: Abnormal   Collection Time: 02/16/22  5:21 PM  Result Value Ref Range   Sodium 141 135 - 145 mmol/L   Potassium 3.7 3.5 - 5.1 mmol/L   Chloride 101 98 - 111 mmol/L   CO2 29 22 - 32 mmol/L   Glucose, Bld 122 (H) 70 - 99 mg/dL   BUN 15 8 - 23 mg/dL   Creatinine, Ser 0.65 0.44 - 1.00 mg/dL   Calcium 9.6 8.9 - 10.3 mg/dL   Total Protein 8.2 (H) 6.5 - 8.1 g/dL   Albumin 4.5 3.5 - 5.0 g/dL   AST 26 15 - 41 U/L   ALT 17 0 - 44 U/L   Alkaline Phosphatase 60 38 - 126 U/L   Total Bilirubin 0.7 0.3 - 1.2 mg/dL   GFR, Estimated >60 >60 mL/min   Anion gap 11 5 - 15  CBC with Differential     Status: Abnormal   Collection Time: 02/16/22  5:21 PM  Result Value Ref Range   WBC 13.2 (H) 4.0 - 10.5 K/uL   RBC 4.78 3.87 - 5.11 MIL/uL   Hemoglobin 13.7 12.0 - 15.0 g/dL   HCT 42.8 36.0 - 46.0 %   MCV 89.5 80.0 - 100.0 fL   MCH 28.7  26.0 - 34.0 pg   MCHC 32.0 30.0 - 36.0 g/dL   RDW 13.2 11.5 - 15.5 %   Platelets 223 150 - 400 K/uL   nRBC 0.0 0.0 - 0.2 %   Neutrophils Relative % 87 %   Neutro Abs 11.5 (H) 1.7 - 7.7 K/uL   Lymphocytes Relative 9 %   Lymphs Abs 1.1 0.7 - 4.0 K/uL   Monocytes Relative 3 %   Monocytes Absolute 0.4 0.1 - 1.0 K/uL   Eosinophils Relative 0 %   Eosinophils Absolute 0.0 0.0 - 0.5 K/uL   Basophils Relative 0 %   Basophils Absolute 0.1 0.0 - 0.1 K/uL   Immature Granulocytes 1 %   Abs Immature Granulocytes 0.06 0.00 - 0.07 K/uL  Troponin I (High Sensitivity)     Status: Abnormal   Collection Time: 02/16/22  5:21 PM  Result Value Ref Range   Troponin I (High Sensitivity) 33 (H) <18 ng/L  Troponin I (High Sensitivity)     Status: Abnormal   Collection  Time: 02/16/22  7:35 PM  Result Value Ref Range   Troponin I (High Sensitivity) 225 (HH) <18 ng/L  Troponin I (High Sensitivity)     Status: Abnormal   Collection Time: 02/16/22 10:52 PM  Result Value Ref Range   Troponin I (High Sensitivity) 356 (HH) <18 ng/L  Surgical PCR screen     Status: None   Collection Time: 02/17/22 12:27 AM   Specimen: Nasal Mucosa; Nasal Swab  Result Value Ref Range   MRSA, PCR NEGATIVE NEGATIVE   Staphylococcus aureus NEGATIVE NEGATIVE  Basic metabolic panel     Status: Abnormal   Collection Time: 02/17/22  1:48 AM  Result Value Ref Range   Sodium 141 135 - 145 mmol/L   Potassium 4.2 3.5 - 5.1 mmol/L   Chloride 105 98 - 111 mmol/L   CO2 29 22 - 32 mmol/L   Glucose, Bld 136 (H) 70 - 99 mg/dL   BUN 16 8 - 23 mg/dL   Creatinine, Ser 0.71 0.44 - 1.00 mg/dL   Calcium 9.5 8.9 - 10.3 mg/dL   GFR, Estimated >60 >60 mL/min   Anion gap 7 5 - 15  CBC     Status: Abnormal   Collection Time: 02/17/22  1:48 AM  Result Value Ref Range   WBC 10.4 4.0 - 10.5 K/uL   RBC 4.10 3.87 - 5.11 MIL/uL   Hemoglobin 11.7 (L) 12.0 - 15.0 g/dL   HCT 36.7 36.0 - 46.0 %   MCV 89.5 80.0 - 100.0 fL   MCH 28.5 26.0 - 34.0 pg    MCHC 31.9 30.0 - 36.0 g/dL   RDW 13.4 11.5 - 15.5 %   Platelets 169 150 - 400 K/uL   nRBC 0.0 0.0 - 0.2 %  Troponin I (High Sensitivity)     Status: Abnormal   Collection Time: 02/17/22  1:48 AM  Result Value Ref Range   Troponin I (High Sensitivity) 353 (HH) <18 ng/L  APTT     Status: None   Collection Time: 02/17/22  1:48 AM  Result Value Ref Range   aPTT 30 24 - 36 seconds  Protime-INR     Status: None   Collection Time: 02/17/22  1:48 AM  Result Value Ref Range   Prothrombin Time 13.9 11.4 - 15.2 seconds   INR 1.1 0.8 - 1.2  Troponin I (High Sensitivity)     Status: Abnormal   Collection Time: 02/17/22  5:14 AM  Result Value Ref Range   Troponin I (High Sensitivity) 294 (HH) <18 ng/L  Heparin level (unfractionated)     Status: None   Collection Time: 02/17/22  9:20 AM  Result Value Ref Range   Heparin Unfractionated 0.43 0.30 - 0.70 IU/mL  Troponin I (High Sensitivity)     Status: Abnormal   Collection Time: 02/17/22  9:20 AM  Result Value Ref Range   Troponin I (High Sensitivity) 221 (HH) <18 ng/L    ECHOCARDIOGRAM COMPLETE  Result Date: 02/17/2022    ECHOCARDIOGRAM REPORT   Patient Name:   Joanne Gomez Date of Exam: 02/17/2022 Medical Rec #:  [id number]       Height:       64.0 in Accession #:    [id number]      Weight:       112.0 lb Date of Birth:  August 05, 1930       BSA:          1.529 m Patient Age:    88 years  BP:           121/62 mmHg Patient Gender: F               HR:           71 bpm. Exam Location:  [facility] Procedure: 2D Echo Indications:     Elevated Troponin  History:         Patient has prior history of Echocardiogram examinations, most                  recent 10/30/2019.  Sonographer:     [name] Referring Phys:  [place] Diagnosing Phys: Serafina Royals MD IMPRESSIONS  1. Left ventricular ejection fraction, by estimation, is 60 to 65%. The left ventricle has normal function. The left ventricle has no regional wall motion abnormalities.  Left ventricular diastolic parameters were normal.  2. Right ventricular systolic function is normal. The right ventricular size is normal.  3. The mitral valve is normal in structure. Trivial mitral valve regurgitation.  4. The aortic valve is normal in structure. Aortic valve regurgitation is trivial. FINDINGS  Left Ventricle: Left ventricular ejection fraction, by estimation, is 60 to 65%. The left ventricle has normal function. The left ventricle has no regional wall motion abnormalities. The left ventricular internal cavity size was normal in size. There is  no left ventricular hypertrophy. Left ventricular diastolic parameters were normal. Right Ventricle: The right ventricular size is normal. No increase in right ventricular wall thickness. Right ventricular systolic function is normal. Left Atrium: Left atrial size was normal in size. Right Atrium: Right atrial size was normal in size. Pericardium: There is no evidence of pericardial effusion. Mitral Valve: The mitral valve is normal in structure. Trivial mitral valve regurgitation. Tricuspid Valve: The tricuspid valve is normal in structure. Tricuspid valve regurgitation is trivial. Aortic Valve: The aortic valve is normal in structure. Aortic valve regurgitation is trivial. Aortic valve peak gradient measures 8.1 mmHg. Pulmonic Valve: The pulmonic valve was normal in structure. Pulmonic valve regurgitation is trivial. Aorta: The aortic root and ascending aorta are structurally normal, with no evidence of dilitation. IAS/Shunts: No atrial level shunt detected by color flow Doppler.  LEFT VENTRICLE PLAX 2D LVIDd:         2.40 cm     Diastology LVIDs:         1.70 cm     LV e' medial:    7.51 cm/s LV PW:         1.20 cm     LV E/e' medial:  13.3 LV IVS:        1.30 cm     LV e' lateral:   7.67 cm/s LVOT diam:     1.70 cm     LV E/e' lateral: 13.0 LV SV:         45 LV SV Index:   29 LVOT Area:     2.27 cm  LV Volumes (MOD) LV vol d, MOD A2C: 24.0 ml LV vol d,  MOD A4C: 29.5 ml LV vol s, MOD A2C: 10.3 ml LV vol s, MOD A4C: 10.0 ml LV SV MOD A2C:     13.7 ml LV SV MOD A4C:     29.5 ml LV SV MOD BP:      16.3 ml RIGHT VENTRICLE RV Basal diam:  3.00 cm RV S prime:     13.75 cm/s TAPSE (M-mode): 2.2 cm LEFT ATRIUM  Index        RIGHT ATRIUM          Index LA diam:        2.30 cm 1.50 cm/m   RA Area:     9.92 cm LA Vol (A2C):   14.4 ml 9.42 ml/m   RA Volume:   21.50 ml 14.06 ml/m LA Vol (A4C):   19.5 ml 12.75 ml/m LA Biplane Vol: 17.9 ml 11.71 ml/m  AORTIC VALVE                 PULMONIC VALVE AV Area (Vmax): 1.55 cm     PV Vmax:          0.81 m/s AV Vmax:        142.00 cm/s  PV Peak grad:     2.6 mmHg AV Peak Grad:   8.1 mmHg     PR End Diast Vel: 4.33 msec LVOT Vmax:      96.90 cm/s LVOT Vmean:     61.800 cm/s LVOT VTI:       0.197 m  AORTA Ao Root diam: 2.90 cm Ao Asc diam:  2.80 cm MITRAL VALVE               TRICUSPID VALVE MV Area (PHT): 3.28 cm    TR Peak grad:   34.8 mmHg MV Decel Time: 231 msec    TR Vmax:        295.00 cm/s MV E velocity: 99.45 cm/s MV A velocity: 97.75 cm/s  SHUNTS MV E/A ratio:  1.02        Systemic VTI:  0.20 m                            Systemic Diam: 1.70 cm Serafina Royals MD Electronically signed by Serafina Royals MD Signature Date/Time: 02/17/2022/10:23:06 AM    Final    CT Hip Right Wo Contrast  Result Date: 02/16/2022 CLINICAL DATA:  Fall.  Right hip pain EXAM: CT OF THE RIGHT HIP WITHOUT CONTRAST TECHNIQUE: Multidetector CT imaging of the right hip was performed according to the standard protocol. Multiplanar CT image reconstructions were also generated. RADIATION DOSE REDUCTION: This exam was performed according to the departmental dose-optimization program which includes automated exposure control, adjustment of the mA and/or kV according to patient size and/or use of iterative reconstruction technique. COMPARISON:  X-ray 02/16/2022 FINDINGS: Bones/Joint/Cartilage Acute comminuted intertrochanteric fracture of the  proximal right femur. Minimal displacement of the greater trochanteric fracture component. Fracture is otherwise relatively nondisplaced. No dislocation of the right hip. 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 Included portion of the right hemipelvis is intact without fracture or diastasis. Bones are demineralized. No lytic or sclerotic bone lesion is evident. Ligaments Suboptimally assessed by CT. Muscles and Tendons No acute musculotendinous abnormality by CT. Soft tissues No fluid collection or hematoma within the soft tissues. No right inguinal lymphadenopathy. IMPRESSION: Acute comminuted intertrochanteric fracture of the proximal right femur, as above. Electronically Signed   By: Davina Poke D.O.   On: 02/16/2022 18:33   DG Chest 1 View  Result Date: 02/16/2022 CLINICAL DATA:  Status post fall on the right-side. EXAM: CHEST  1 VIEW COMPARISON:  Oct 19, 2019 FINDINGS: The heart size and mediastinal contours are within normal limits. The lungs are hyperinflated. Mild, diffuse, chronic appearing increased lung markings are seen. There is no evidence of acute infiltrate, pleural effusion or pneumothorax. The visualized skeletal structures  are unremarkable. IMPRESSION: Chronic-appearing increased lung markings without acute or active cardiopulmonary disease. Electronically Signed   By: Virgina Norfolk M.D.   On: 02/16/2022 17:22   DG Hip Unilat W or Wo Pelvis 2-3 Views Right  Result Date: 02/16/2022 CLINICAL DATA:  Right hip pain after falling. EXAM: DG HIP (WITH OR WITHOUT PELVIS) 2-3V RIGHT COMPARISON:  Abdominal radiographs 07/01/2018. Pelvic CT 11/12/2011. FINDINGS: The bones are demineralized. The right hip is flexed and adducted with resulting suboptimal evaluation of the femoral neck and trochanteric region. No definite acute femur fracture is confirmed. No evidence of dislocation or pelvic fracture. IMPRESSION: Limited examination due to positioning. No definite acute fracture  identified. Given the limitations of this study, further evaluation with CT of the right hip is recommended. Electronically Signed   By: Richardean Sale M.D.   On: 02/16/2022 17:11    Assessment/Plan: Day of Surgery   Principal Problem:   Intertrochanteric fracture of right femur, closed, initial encounter [facility]) Active Problems:   Hypothyroidism   Osteoporosis   GERD (gastroesophageal reflux disease)   Essential hypertension   Elevated troponin  Patient currently stable.  Labs are within acceptable limits.  Troponin is trending down.  All signs are stable.  Patient will remain n.p.o. in preparation for surgery this afternoon.      Thornton Park , MD 02/17/2022, 3:01 PM

## 2022-02-17 NOTE — Op Note (Signed)
DATE OF SURGERY:  02/17/2022  TIME: 6:23 PM  PATIENT NAME:  Joanne Gomez  AGE: 88 y.o.  PRE-OPERATIVE DIAGNOSIS: Right comminuted trochanteric hip fracture  POST-OPERATIVE DIAGNOSIS:  SAME  PROCEDURE: Intramedullary fixation for right intertrochanteric hip fracture  SURGEON:  Thornton Park  OPERATIVE IMPLANTS: Biomet short Affixus nail 11 x 180 mm, 95 mm lag screw with a 34 mm distal interlocking screw  PREOPERATIVE INDICATIONS:  SALONI LABLANC is a 88 y.o. year old who fell and suffered a hip fracture. She was brought into the ER and then admitted and medically cleared for surgical intervention.    The risks, benefits and alternatives were discussed with the patient and their family.  The risks include but are not limited to infection, bleeding, nerve or blood vessel injury, malunion, nonunion, hardware prominence, hardware failure, change in leg lengths or lower extremity rotation need for further surgery including hardware removal with conversion to a total hip arthroplasty. Medical risks include but are not limited to DVT and pulmonary embolism, myocardial infarction, stroke, pneumonia, respiratory failure and death. The patient and their family understood these risks and wished to proceed with surgery.  OPERATIVE PROCEDURE:  The patient was brought to the operating room and placed in the supine position on the fracture table.  General anesthesia was administered.  A time out was performed to verify the patient's name, date of birth, medical record number, correct site of surgery correct procedure to be performed. The timeout was also used to verify the patient received antibiotics and all appropriate instruments, implants and radiographic studies were available in the room. Once all in attendance were in agreement, the case began. A closed reduction was performed under C-arm guidance.  The fracture reduction was confirmed on both AP and lateral views.  The patient was prepped and  draped in a sterile fashion. She received preoperative antibiotics with 2 g of Ancef IV.  An incision was made proximal to the greater trochanter in line with the femur. A guidewire was placed over the tip of the greater trochanter and advanced into the proximal femur to the level of the lesser trochanter. Confirmation of the drill pin position was made on AP and lateral C-arm images. The threaded guidepin was then overdrilled with the proximal femoral drill.  The nail was then inserted into the proximal femur, across the fracture site and into the femoral shaft. Its position was confirmed on AP and lateral C-arm images.   Once the nail was completely seated, the drill guide for the lag screw was placed through the guide arm for the Affixus nail. A guidepin was then placed through this drill guide and advanced through the lateral cortex of the femur, across the fracture site and into the femoral head achieving a tip apex distance of less than 25 mm. The depth of the drill pin was measured at 95 mm, and then the drill for the lag screw was advanced over the guidepin and through the lateral cortex, across the fracture site and up into the femoral head to the depth previously measured.  The lag screw was then advanced by hand into position across the fracture site into the femoral head. Its final position was confirmed on AP and lateral C-arm images. Compression was applied as traction was carefully released. The set screw in the top of the intramedullary rod was tightened by hand using a screwdriver. It was backed off a quarter turn to allow for compression at the fracture site.  The drill sleeve  for the distal interlocking screw was then placed through the Affixus guide arm. A small stab incision was made to allow the drill guide to approximate the lateral cortex of the femur. The drill for the distal interlocking screw was then advanced bicortically. The depth of this drill was measured. A distal interlocking  screw with the length of 34 was then inserted by hand through the guide arm. Final C-arm images of the entire intramedullary construct were taken in both the AP and lateral planes.   The wounds were irrigated copiously and closed with 0 Vicryl for closure of the deep fascia and 2-0 Vicryl for subcutaneous closure. The skin was approximated with staples. A dry sterile dressing was applied. I was scrubbed and present the entire case and all sharp and instrument counts were correct at the conclusion of the case. Patient was transferred to hospital bed and brought to PACU in stable condition. I spoke with the patient's family in the surgical waiting room to let them know the case had been performed without complication and the patient was stable in the recovery room.    Timoteo Gaul, MD

## 2022-02-17 NOTE — Plan of Care (Signed)
  Problem: Activity: Goal: Risk for activity intolerance will decrease 02/17/2022 1250 by Celso Amy, RN Outcome: Progressing 02/17/2022 1250 by Celso Amy, RN Outcome: Progressing   Problem: Nutrition: Goal: Adequate nutrition will be maintained 02/17/2022 1250 by Celso Amy, RN Outcome: Progressing 02/17/2022 1250 by Celso Amy, RN Outcome: Progressing   Problem: Pain Managment: Goal: General experience of comfort will improve 02/17/2022 1250 by Celso Amy, RN Outcome: Progressing 02/17/2022 1250 by Celso Amy, RN Outcome: Progressing

## 2022-02-17 NOTE — Progress Notes (Addendum)
1315 CHG wipes completed. Linen and gown changed. Consent in chart. Ancef abx on call to OR. Pt ready for surgery  1533 Report given to [place] in OR. All questions and concerns answered. Transport to come take pt to pre op  1547 Pt being transported to surgery at this time.

## 2022-02-17 NOTE — Progress Notes (Signed)
[facility] Progress Note  Patient: Joanne Gomez    RJJ:[id number]  DOA: 02/16/2022    Date of Service: the patient was seen and examined on 02/17/2022  Brief hospital course: 88 year old female with past medical history of hypothyroidism and hypertension presented to the emergency room on 9/22 after she had a mechanical fall and landed on her right hip and was unable to stand without pain.  In the emergency room, CT scan of right hip noted acute comminuted intertrochanteric fracture of the proximal right femur.  Patient mated to the hospitalist service and orthopedic surgery consulted who plans to take patient to the OR on 9/23 for repair.  Following admission, patient noted to have some cardiac enzymes trending upward.  EKG unremarkable.  Cardiology consulted and cleared patient for surgery.  Assessment and Plan: Assessment and Plan: * Intertrochanteric fracture of right femur, closed, initial encounter ([place]) Appears to be secondary to mechanical fall.  Patient presents with acute comminuted intertrochanteric fracture of the proximal right femur.  Cleared by cardiology, for repair this afternoon by orthopedic surgery.  Explained that likely she will need skilled nursing after.  Monitor for acute blood loss anemia.  Pain relatively well controlled.  Elevated troponin Chest pain-free.  No shortness of breath.  EKG unremarkable.  Peaked at 356.  Since then has been trending downward.  Not felt to be secondary to ACS.  May have been secondary to hypertension and impact of injury.  Essential hypertension Blood pressure initially elevated on admission, likely secondary to pain.  Since then has been stable.  Continue to monitor.  Continue home medications.  Osteoporosis Suspect a contributing factor to patient's fracture.  Continue calcium and vitamin D.  GERD (gastroesophageal reflux disease) Stable Continue PPI  Hypothyroidism Stable Continue Synthroid       Body mass index  is 19.22 kg/m.        Consultants: Orthopedic surgery Cardiology  Procedures: Planned hip fracture repair 9/23  Antimicrobials: Preop Ancef  Code Status: Full code   Subjective: Patient doing okay.  No pain if she does not move.  Objective: Vital signs were reviewed and unremarkable. Vitals:   02/17/22 0510 02/17/22 1226  BP: 121/62 (!) 143/74  Pulse: 65 77  Resp: 16   Temp: 97.8 F (36.6 C) 97.7 F (36.5 C)  SpO2: 98% 98%    Intake/Output Summary (Last 24 hours) at 02/17/2022 1343 Last data filed at 02/17/2022 0514 Gross per 24 hour  Intake 0 ml  Output 0 ml  Net 0 ml   Filed Weights   02/16/22 1540  Weight: 50.8 kg   Body mass index is 19.22 kg/m.  Exam:  General: Alert and oriented x3, no acute distress HEENT: Normocephalic, atraumatic, mucous membranes are moist Cardiovascular: Regular rate and rhythm, S1-S2 Respiratory: Clear to auscultation bilaterally Abdomen: Soft, nontender, nondistended, positive bowel sounds Musculoskeletal: No clubbing or cyanosis or edema.  No further musculoskeletal exam secondary to fracture Skin: No skin breaks, tears, lesions Psychiatry: Appropriate, no evidence of psychosis Neurology: No focal deficits  Data Reviewed: Today's labs within normal limits, downtrending troponin  Disposition:  Status is: Inpatient Remains inpatient appropriate because:  -Surgery -Postop disposition    Anticipated discharge date: 9/25 or 47   Family Communication: Several family members at the bedside DVT Prophylaxis: SCDs Start: 02/16/22 1949    Author: Annita Brod ,MD 02/17/2022 1:43 PM  To reach On-call, see care teams to locate the attending and reach out via [email or web address]. Between 7PM-7AM, please contact  night-coverage If you still have difficulty reaching the attending provider, please page the [facility] (Director on Call) for [facility] on amion for assistance.

## 2022-02-17 NOTE — Hospital Course (Signed)
88 year old female with past medical history of hypothyroidism and hypertension presented to the emergency room on 9/22 after she had a mechanical fall and landed on her right hip and was unable to stand without pain.  In the emergency room, CT scan of right hip noted acute comminuted intertrochanteric fracture of the proximal right femur.  Patient mated to the hospitalist service and orthopedic surgery consulted who plans to take patient to the OR on 9/23 for repair.  Following admission, patient noted to have some cardiac enzymes trending upward.  EKG unremarkable.  Cardiology consulted and cleared patient for surgery.

## 2022-02-17 NOTE — Progress Notes (Signed)
ANTICOAGULATION CONSULT NOTE  Pharmacy Consult for heparin infusion Indication: ACS/STEMI  No Known Allergies  Patient Measurements: Height: '5\' 4"'$  (162.6 cm) Weight: 50.8 kg (112 lb) IBW/kg (Calculated) : 54.7 Heparin Dosing Weight: 50.8 kg  Vital Signs: Temp: 97.8 F (36.6 C) (09/23 0510) BP: 121/62 (09/23 0510) Pulse Rate: 65 (09/23 0510)  Labs: Recent Labs    02/16/22 1721 02/16/22 1935 02/17/22 0148 02/17/22 0514 02/17/22 0920  HGB 13.7  --  11.7*  --   --   HCT 42.8  --  36.7  --   --   PLT 223  --  169  --   --   APTT  --   --  30  --   --   LABPROT  --   --  13.9  --   --   INR  --   --  1.1  --   --   HEPARINUNFRC  --   --   --   --  0.43  CREATININE 0.65  --  0.71  --   --   TROPONINIHS 33*   < > 353* 294* 221*   < > = values in this interval not displayed.     Estimated Creatinine Clearance: 38.2 mL/min (by C-G formula based on SCr of 0.71 mg/dL).   Medical History: Past Medical History:  Diagnosis Date   Cancer ([place]) 11/2007   colon stage II   Diverticula of colon    GERD (gastroesophageal reflux disease)    H/O colon cancer, stage II 11/15/2011   HH (hiatus hernia)    History of hiatal hernia    HOH (hard of hearing)    MILD LOSS   Hyperlipidemia    Hypothyroidism    Osteoporosis    Pneumonia     Assessment: Pt is a 88 yo female presenting to ED for hip pain following fall, now found with elevated troponin I lvl, trending up.  9/23 0920 HL= 0.43  therapeutic  Goal of Therapy:  Heparin level 0.3-0.7 units/ml Monitor platelets by anticoagulation protocol: Yes   Plan:  9/23 0920 HL= 0.43  therapeutic Heparin drip has been discontinued  Chinita Greenland PharmD Clinical Pharmacist 02/17/2022

## 2022-02-17 NOTE — Progress Notes (Signed)
Patient noted to have an uptrending troponin level We will start patient on aspirin, low-dose beta-blocker and heparin Continue to cycle cardiac enzymes Obtain 2D echocardiogram to rule out regional wall motion abnormality Request cardiology consult

## 2022-02-17 NOTE — Progress Notes (Signed)
*  PRELIMINARY RESULTS* Echocardiogram 2D Echocardiogram has been performed.  Joanne Gomez 02/17/2022, 9:08 AM

## 2022-02-17 NOTE — Progress Notes (Signed)
Patient has been NPO after midnight except for sips with meds.

## 2022-02-17 NOTE — Progress Notes (Signed)
Nurse reached out to Dr Francine Graven  for clarification regarding this patients transfer to unit 2A as it wasn't made clear in the notes as to why the transfer was necessary.  Nurse Elmo Putt fron 2A reached out as she was waiting on the patient to arrive, and it was communicated to her that the reason for transfer was not made clear and that a clarification request was sent.  Dr. Francine Graven chat paged back and provided clarification for the transfer.  The provider did not know if the nurses on this unit could administer a Heparin drip and bolus.  The provider was advised that the heparin has been started on this unit.  Provider concluded that there was not need to transfer the patient to 2A.  Dr. Francine Graven confirmed that she did not know how to remove the transfer order or change the patients level of care.

## 2022-02-17 NOTE — Progress Notes (Signed)
ANTICOAGULATION CONSULT NOTE  Pharmacy Consult for heparin infusion Indication: ACS/STEMI  No Known Allergies  Patient Measurements: Height: '5\' 4"'$  (162.6 cm) Weight: 50.8 kg (112 lb) IBW/kg (Calculated) : 54.7 Heparin Dosing Weight: 50.8 kg  Vital Signs: Temp: 99 F (37.2 C) (09/23 0029) Temp Source: Oral (09/22 1930) BP: 128/68 (09/23 0029) Pulse Rate: 79 (09/23 0029)  Labs: Recent Labs    02/16/22 1721 02/16/22 1935 02/16/22 2252  HGB 13.7  --   --   HCT 42.8  --   --   PLT 223  --   --   CREATININE 0.65  --   --   TROPONINIHS 33* 225* 356*    Estimated Creatinine Clearance: 38.2 mL/min (by C-G formula based on SCr of 0.65 mg/dL).   Medical History: Past Medical History:  Diagnosis Date   Cancer ([place]) 11/2007   colon stage II   Diverticula of colon    GERD (gastroesophageal reflux disease)    H/O colon cancer, stage II 11/15/2011   HH (hiatus hernia)    History of hiatal hernia    HOH (hard of hearing)    MILD LOSS   Hyperlipidemia    Hypothyroidism    Osteoporosis    Pneumonia     Assessment: Pt is a 88 yo female presenting to ED for hip pain following fall, now found with elevated troponin I lvl, trending up.  Goal of Therapy:  Heparin level 0.3-0.7 units/ml Monitor platelets by anticoagulation protocol: Yes   Plan:  Bolus 3000 units x 1 Start heparin infusion at 650 units/hr Will check HL in 8 hr after start of infusion CBC daily while on heparin  Renda Rolls, PharmD, [facility] 02/17/2022 12:49 AM

## 2022-02-17 NOTE — Consult Note (Signed)
[facility] Cardiology Consultation Note  Patient ID: LUX SKILTON, MRN: [id number], DOB/AGE: 1930-06-11 88 y.o. Admit date: 02/16/2022   Date of Consult: 02/17/2022 Primary Physician: Susy Frizzle, MD Primary Cardiologist: None  Chief Complaint:  Chief Complaint  Patient presents with   Fall   Reason for Consult:  Troponin elevation  HPI: 88 y.o. female with hypertension and no other previous cardiovascular history who has had no evidence of significant cardiovascular symptoms in the last many months.  The patient does most of her physical activity she wishes in and around her house with no evidence of PND orthopnea syncope dizziness nausea chest pain shortness of breath or diaphoresis.  She did have a fall and broke her hip for which she has had no other significant symptoms.  She has been on antihypertensive medication management with no evidence of side effects.  The patient does have an EKG which is normal and a chest x-ray showing no evidence of cardiovascular concerns.  Troponins were performed which appear to be elevated but flat at this time not consistent with acute coronary syndrome.  They are of unknown significance with no evidence of cardiovascular symptoms.  Therefore the patient is at lowest risk possible for cardiovascular risk with surgical intervention  Past Medical History:  Diagnosis Date   Cancer ([place]) 11/2007   colon stage II   Diverticula of colon    GERD (gastroesophageal reflux disease)    H/O colon cancer, stage II 11/15/2011   HH (hiatus hernia)    History of hiatal hernia    HOH (hard of hearing)    MILD LOSS   Hyperlipidemia    Hypothyroidism    Osteoporosis    Pneumonia       Surgical History:  Past Surgical History:  Procedure Laterality Date   ABDOMINAL HYSTERECTOMY     APPENDECTOMY     CATARACT EXTRACTION W/PHACO Right 08/30/2015   Procedure: CATARACT EXTRACTION PHACO AND INTRAOCULAR LENS PLACEMENT ([place]);  Surgeon: Birder Robson, MD;   Location: [facility] ORS;  Service: Ophthalmology;  Laterality: Right;   [place]    00:40.8 AP%  15.7 CDE    6.42   fluid pack lot #[id number]  exp 02/24/2017   CATARACT EXTRACTION W/PHACO Left 09/20/2015   Procedure: CATARACT EXTRACTION PHACO AND INTRAOCULAR LENS PLACEMENT ([facility]);  Surgeon: Birder Robson, MD;  Location: [facility] ORS;  Service: Ophthalmology;  Laterality: Left;  [place] 44.8 AP% 19.6 CDE 8.77 FLUID PACK LOT # [id number]   CHOLECYSTECTOMY     ELBOW SURGERY     right    hemocolectomy  2009   VAGINAL PROLAPSE REPAIR       Home Meds: Prior to Admission medications   Medication Sig Start Date End Date Taking? Authorizing Provider  amLODipine (NORVASC) 10 MG tablet Take 1 tablet (10 mg total) by mouth daily. 12/28/21  Yes Susy Frizzle, MD  CALCIUM CITRATE PO Take 1 tablet by mouth 2 (two) times daily. '630mg'$    Yes [provider]  Cholecalciferol (VITAMIN D3) 2000 units TABS Take 1 tablet by mouth daily.   Yes [provider]  esomeprazole (NEXIUM) 40 MG capsule Take 40 mg by mouth daily as needed.   Yes [provider]  Magnesium 250 MG TABS Take 1 tablet by mouth daily.   Yes [provider]  Multiple Vitamin (MULTIVITAMIN) tablet Take 1 tablet by mouth daily.   Yes [provider]  nystatin ointment (MYCOSTATIN) Apply 1 application. topically 2 (two) times daily as needed.  09/21/21  Yes Susy Frizzle, MD  thyroid (ARMOUR THYROID) 60 MG tablet TAKE 1 & 1/2 TABLETS FOUR TIMES A WEEK AND TAKE 1 TABLET THREE TIMES A WEEK 02/12/22  Yes Susy Frizzle, MD  traMADol (ULTRAM) 50 MG tablet Take 50 mg by mouth 3 (three) times daily as needed. 02/16/22  Yes [provider]  triamcinolone (NASACORT) 55 MCG/ACT AERO nasal inhaler 2 sprays 2 (two) times daily. 01/04/22  Yes [provider]  vitamin B-12 (CYANOCOBALAMIN) 1000 MCG tablet Take 5,000 mcg by mouth daily.   Yes [provider]  vitamin E 100 UNIT capsule Take 200 Units by  mouth daily.   Yes [provider]  amLODipine (NORVASC) 2.5 MG tablet Take 1 tablet (2.5 mg total) by mouth daily. Patient not taking: Reported on 02/16/2022 09/20/20   Minna Merritts, MD  Fluocinolone Acetonide 0.01 % OIL Place 3 drops in ear(s) 2 (two) times daily as needed. 02/17/19   Susy Frizzle, MD  Lifite[employer] Shirley Friar) 5 % SOLN [place] 5 % eye drops in a dropperette    [provider]    Inpatient Medications:   amLODipine  10 mg Oral Daily   aspirin EC  81 mg Oral Daily   calcium citrate  200 mg of elemental calcium Oral BID   cholecalciferol  2,000 Units Oral Daily   cyanocobalamin  5,000 mcg Oral Daily   magnesium oxide  200 mg Oral Daily   metoprolol tartrate  12.5 mg Oral BID   multivitamin with minerals  1 tablet Oral Daily   pantoprazole  40 mg Oral Daily   senna  1 tablet Oral BID   thyroid  90 mg Oral Once per day on Sun Tue Thu Sat   And   [START ON 02/19/2022] thyroid  60 mg Oral Once per day on Mon Wed Fri     ceFAZolin (ANCEF) IV     heparin 650 Units/hr (02/17/22 0218)    Allergies: No Known Allergies  Social History   Socioeconomic History   Marital status: Married    Spouse name: Not on file   Number of children: Not on file   Years of education: Not on file   Highest education level: Not on file  Occupational History   Not on file  Tobacco Use   Smoking status: Never   Smokeless tobacco: Never  Vaping Use   Vaping Use: Never used  Substance and Sexual Activity   Alcohol use: Yes    Comment: occ   Drug use: No   Sexual activity: Yes    Comment: Married to [place], retired.  Other Topics Concern   Not on file  Social History Narrative   Not on file   Social Determinants of Health   Financial Resource Strain: Low Risk  (12/01/2020)   Overall Financial Resource Strain (CARDIA)    Difficulty of Paying Living Expenses: Not hard at all  Food Insecurity: No Food Insecurity (02/16/2022)   Hunger Vital Sign    Worried About  Running Out of Food in the Last Year: Never true    Ran Out of Food in the Last Year: Never true  Transportation Needs: No Transportation Needs (02/16/2022)   PRAPARE - Hydrologist (Medical): No    Lack of Transportation (Non-Medical): No  Physical Activity: Insufficiently Active (12/01/2020)   Exercise Vital Sign    Days of Exercise per Week: 3 days    Minutes of Exercise per Session:  20 min  Stress: No Stress Concern Present (12/01/2020)   [place]    Feeling of Stress : Not at all  Social Connections: Moderately Integrated (12/01/2020)   Social Connection and Isolation Panel [NHANES]    Frequency of Communication with Friends and Family: More than three times a week    Frequency of Social Gatherings with Friends and Family: Three times a week    Attends Religious Services: 1 to 4 times per year    Active Member of Clubs or Organizations: No    Attends Archivist Meetings: Never    Marital Status: Married  Human resources officer Violence: Not At Risk (02/16/2022)   Humiliation, Afraid, Rape, and Kick questionnaire    Fear of Current or Ex-Partner: No    Emotionally Abused: No    Physically Abused: No    Sexually Abused: No     Family History  Problem Relation Age of Onset   Heart attack Sister 54   Hyperlipidemia Sister    Hypertension Sister    Breast cancer Neg Hx      Review of Systems Positive for hip pain Negative for: General:  chills, fever, night sweats or weight changes.  Cardiovascular: PND orthopnea syncope dizziness  Dermatological skin lesions rashes Respiratory: Cough congestion Urologic: Frequent urination urination at night and hematuria Abdominal: negative for nausea, vomiting, diarrhea, bright red blood per rectum, melena, or hematemesis Neurologic: negative for visual changes, and/or hearing changes  All other systems reviewed and are otherwise negative  except as noted above.  Labs: No results for input(s): "CKTOTAL", "CKMB", "TROPONINI" in the last 72 hours. Lab Results  Component Value Date   WBC 10.4 02/17/2022   HGB 11.7 (L) 02/17/2022   HCT 36.7 02/17/2022   MCV 89.5 02/17/2022   PLT 169 02/17/2022    Recent Labs  Lab 02/16/22 1721 02/17/22 0148  NA 141 141  K 3.7 4.2  CL 101 105  CO2 29 29  BUN 15 16  CREATININE 0.65 0.71  CALCIUM 9.6 9.5  PROT 8.2*  --   BILITOT 0.7  --   ALKPHOS 60  --   ALT 17  --   AST 26  --   GLUCOSE 122* 136*   Lab Results  Component Value Date   CHOL 195 09/19/2021   HDL 83 09/19/2021   LDLCALC 97 09/19/2021   TRIG 60 09/19/2021   No results found for: "DDIMER"  Radiology/Studies:  CT Hip Right Wo Contrast  Result Date: 02/16/2022 CLINICAL DATA:  Fall.  Right hip pain EXAM: CT OF THE RIGHT HIP WITHOUT CONTRAST TECHNIQUE: Multidetector CT imaging of the right hip was performed according to the standard protocol. Multiplanar CT image reconstructions were also generated. RADIATION DOSE REDUCTION: This exam was performed according to the departmental dose-optimization program which includes automated exposure control, adjustment of the mA and/or kV according to patient size and/or use of iterative reconstruction technique. COMPARISON:  X-ray 02/16/2022 FINDINGS: Bones/Joint/Cartilage Acute comminuted intertrochanteric fracture of the proximal right femur. Minimal displacement of the greater trochanteric fracture component. Fracture is otherwise relatively nondisplaced. No dislocation of the right hip. 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 Included portion of the right hemipelvis is intact without fracture or diastasis. Bones are demineralized. No lytic or sclerotic bone lesion is evident. Ligaments Suboptimally assessed by CT. Muscles and Tendons No acute musculotendinous abnormality by CT. Soft tissues No fluid collection or hematoma within the soft tissues. No right inguinal  lymphadenopathy. IMPRESSION: Acute comminuted intertrochanteric fracture of the proximal right femur, as above. Electronically Signed   By: Davina Poke D.O.   On: 02/16/2022 18:33   DG Chest 1 View  Result Date: 02/16/2022 CLINICAL DATA:  Status post fall on the right-side. EXAM: CHEST  1 VIEW COMPARISON:  Oct 19, 2019 FINDINGS: The heart size and mediastinal contours are within normal limits. The lungs are hyperinflated. Mild, diffuse, chronic appearing increased lung markings are seen. There is no evidence of acute infiltrate, pleural effusion or pneumothorax. The visualized skeletal structures are unremarkable. IMPRESSION: Chronic-appearing increased lung markings without acute or active cardiopulmonary disease. Electronically Signed   By: Virgina Norfolk M.D.   On: 02/16/2022 17:22   DG Hip Unilat W or Wo Pelvis 2-3 Views Right  Result Date: 02/16/2022 CLINICAL DATA:  Right hip pain after falling. EXAM: DG HIP (WITH OR WITHOUT PELVIS) 2-3V RIGHT COMPARISON:  Abdominal radiographs 07/01/2018. Pelvic CT 11/12/2011. FINDINGS: The bones are demineralized. The right hip is flexed and adducted with resulting suboptimal evaluation of the femoral neck and trochanteric region. No definite acute femur fracture is confirmed. No evidence of dislocation or pelvic fracture. IMPRESSION: Limited examination due to positioning. No definite acute fracture identified. Given the limitations of this study, further evaluation with CT of the right hip is recommended. Electronically Signed   By: Richardean Sale M.D.   On: 02/16/2022 17:11    EKG: Normal sinus rhythm otherwise normal EKG  Weights: Filed Weights   02/16/22 1540  Weight: 50.8 kg     Physical Exam: Blood pressure 121/62, pulse 65, temperature 97.8 F (36.6 C), resp. rate 16, height '5\' 4"'$  (1.626 m), weight 50.8 kg, SpO2 98 %. Body mass index is 19.22 kg/m. General: Well developed, well nourished, in no acute distress. Head eyes ears nose  throat: Normocephalic, atraumatic, sclera non-icteric, no xanthomas, nares are without discharge. No apparent thyromegaly and/or mass  Lungs: Normal respiratory effort.  no wheezes, no rales, no rhonchi.  Heart: RRR with normal S1 S2. no murmur gallop, no rub, PMI is normal size and placement, carotid upstroke normal without bruit, jugular venous pressure is normal Abdomen: Soft, non-tender, non-distended with normoactive bowel sounds. No hepatomegaly. No rebound/guarding. No obvious abdominal masses. Abdominal aorta is normal size without bruit Extremities: No edema. no cyanosis, no clubbing, no ulcers  Peripheral : 2+ bilateral upper extremity pulses, 2+ bilateral femoral pulses, 2+ bilateral dorsal pedal pulse Neuro: Alert and oriented. No facial asymmetry. No focal deficit. Moves all extremities spontaneously. Musculoskeletal: Normal muscle tone without kyphosis Psych:  Responds to questions appropriately with a normal affect.    Assessment: 88 year old female with hypertension and no evidence of cardiovascular history or symptoms having elevated troponin of unknown significance but clinically at low risk for cardiovascular complication with hip surgery  Plan: 1.  Proceed to hip surgery for treatment of hip fracture without restriction 2.  Continue medication management for hypertension control 3.  Rehabilitation without restriction 4.  No further cardiac diagnostics necessary at this time  Signed, Corey Skains M.D. [facility] 02/17/2022, 7:56 AM

## 2022-02-17 NOTE — Anesthesia Preprocedure Evaluation (Addendum)
Anesthesia Evaluation  Patient identified by MRN, date of birth, ID band Patient awake    Reviewed: Allergy & Precautions, NPO status , Patient's Chart, lab work & pertinent test results  History of Anesthesia Complications Negative for: history of anesthetic complications  Airway Mallampati: III   Neck ROM: Full    Dental  (+) Missing   Pulmonary neg pulmonary ROS,    Pulmonary exam normal breath sounds clear to auscultation       Cardiovascular hypertension, Normal cardiovascular exam Rhythm:Regular Rate:Normal  ECG 02/16/22:  Sinus rhythm Nonspecific intraventricular conduction delay   Neuro/Psych HOH    GI/Hepatic hiatal hernia, GERD  ,Colon CA   Endo/Other  Hypothyroidism   Renal/GU negative Renal ROS     Musculoskeletal   Abdominal   Peds  Hematology negative hematology ROS (+)   Anesthesia Other Findings Cardiology note 02/17/22:  88 year old female with hypertension and no evidence of cardiovascular history or symptoms having elevated troponin of unknown significance but clinically at low risk for cardiovascular complication with hip surgery  Plan: 1.  Proceed to hip surgery for treatment of hip fracture without restriction 2.  Continue medication management for hypertension control 3.  Rehabilitation without restriction 4.  No further cardiac diagnostics necessary at this time  Reproductive/Obstetrics                           Anesthesia Physical Anesthesia Plan  ASA: 3  Anesthesia Plan: General   Post-op Pain Management:    Induction: Intravenous  PONV Risk Score and Plan: 3 and Ondansetron, Dexamethasone and Treatment may vary due to age or medical condition  Airway Management Planned: Oral ETT  Additional Equipment:   Intra-op Plan:   Post-operative Plan: Extubation in OR  Informed Consent: I have reviewed the patients History and Physical, chart, labs and  discussed the procedure including the risks, benefits and alternatives for the proposed anesthesia with the patient or authorized representative who has indicated his/her understanding and acceptance.   Patient has DNR.  Discussed DNR with patient and Suspend DNR.   Dental advisory given  Plan Discussed with: CRNA  Anesthesia Plan Comments: (Patient consented for risks of anesthesia including but not limited to:  - adverse reactions to medications - damage to eyes, teeth, lips or other oral mucosa - nerve damage due to positioning  - sore throat or hoarseness - damage to heart, brain, nerves, lungs, other parts of body or loss of life  Informed patient about role of CRNA in peri- and intra-operative care.  Patient voiced understanding.)      Anesthesia Quick Evaluation

## 2022-02-17 NOTE — Anesthesia Postprocedure Evaluation (Signed)
Anesthesia Post Note  Patient: Joanne Gomez  Procedure(s) Performed: INTRAMEDULLARY (IM) NAIL INTERTROCHANTERIC (Right)  Patient location during evaluation: PACU Anesthesia Type: General Level of consciousness: awake and alert, oriented and patient cooperative Pain management: pain level controlled Vital Signs Assessment: post-procedure vital signs reviewed and stable Respiratory status: spontaneous breathing, nonlabored ventilation and respiratory function stable Cardiovascular status: blood pressure returned to baseline and stable Postop Assessment: adequate PO intake Anesthetic complications: no   No notable events documented.   Last Vitals:  Vitals:   02/17/22 1830 02/17/22 1924  BP: (!) 102/45 (!) 96/55  Pulse: 73 86  Resp: 14 18  Temp:  37.1 C  SpO2: 96% 94%    Last Pain:  Vitals:   02/17/22 1840  TempSrc:   PainSc: [place]

## 2022-02-17 NOTE — Transfer of Care (Signed)
Immediate Anesthesia Transfer of Care Note  Patient: Joanne Gomez  Procedure(s) Performed: INTRAMEDULLARY (IM) NAIL INTERTROCHANTERIC (Right)  Patient Location: PACU  Anesthesia Type:General  Level of Consciousness: awake, alert , oriented and patient cooperative  Airway & Oxygen Therapy: Patient Spontanous Breathing  Post-op Assessment: Report given to RN and Post -op Vital signs reviewed and stable  Post vital signs: stable  Last Vitals:  Vitals Value Taken Time  BP 106/46 02/17/22 1809  Temp 98.41F   Pulse 71 02/17/22 1814  Resp 16 02/17/22 1814  SpO2 97 % 02/17/22 1814  Vitals shown include unvalidated device data.  Last Pain:  Vitals:   02/17/22 1231  TempSrc:   PainSc: 5       Patients Stated Pain Goal: 0 (29/29/09 0301)  Complications: No notable events documented.

## 2022-02-18 DIAGNOSIS — R739 Hyperglycemia, unspecified: Secondary | ICD-10-CM | POA: Diagnosis not present

## 2022-02-18 DIAGNOSIS — D62 Acute posthemorrhagic anemia: Secondary | ICD-10-CM | POA: Diagnosis not present

## 2022-02-18 DIAGNOSIS — S72141A Displaced intertrochanteric fracture of right femur, initial encounter for closed fracture: Secondary | ICD-10-CM | POA: Diagnosis not present

## 2022-02-18 DIAGNOSIS — I10 Essential (primary) hypertension: Secondary | ICD-10-CM | POA: Diagnosis not present

## 2022-02-18 LAB — BASIC METABOLIC PANEL
Anion gap: 9 (ref 5–15)
BUN: 29 mg/dL — ABNORMAL HIGH (ref 8–23)
CO2: 27 mmol/L (ref 22–32)
Calcium: 8.7 mg/dL — ABNORMAL LOW (ref 8.9–10.3)
Chloride: 102 mmol/L (ref 98–111)
Creatinine, Ser: 0.89 mg/dL (ref 0.44–1.00)
GFR, Estimated: >60 mL/min (ref >60)
Glucose, Bld: 159 mg/dL — ABNORMAL HIGH (ref 70–99)
Potassium: 3.8 mmol/L (ref 3.5–5.1)
Sodium: 138 mmol/L (ref 135–145)

## 2022-02-18 LAB — CBC
HCT: 30.5 % — ABNORMAL LOW (ref 36.0–46.0)
HCT: 31.5 % — ABNORMAL LOW (ref 36.0–46.0)
Hemoglobin: 9.8 g/dL — ABNORMAL LOW (ref 12.0–15.0)
Hemoglobin: 10.2 g/dL — ABNORMAL LOW (ref 12.0–15.0)
MCH: 28.7 pg (ref 26.0–34.0)
MCH: 28.8 pg (ref 26.0–34.0)
MCHC: 32.1 g/dL (ref 30.0–36.0)
MCHC: 32.4 g/dL (ref 30.0–36.0)
MCV: 89.4 fL (ref 80.0–100.0)
MCV: 89 fL (ref 80.0–100.0)
Platelets: 131 10*3/uL — ABNORMAL LOW (ref 150–400)
Platelets: 154 10*3/uL (ref 150–400)
RBC: 3.41 MIL/uL — ABNORMAL LOW (ref 3.87–5.11)
RBC: 3.54 MIL/uL — ABNORMAL LOW (ref 3.87–5.11)
RDW: 13.7 % (ref 11.5–15.5)
RDW: 13.6 % (ref 11.5–15.5)
WBC: 11.7 10*3/uL — ABNORMAL HIGH (ref 4.0–10.5)
WBC: 13.6 10*3/uL — ABNORMAL HIGH (ref 4.0–10.5)
nRBC: 0 % (ref 0.0–0.2)
nRBC: 0 % (ref 0.0–0.2)

## 2022-02-18 LAB — HEMOGLOBIN A1C
Hgb A1c MFr Bld: 5.3 % (ref 4.8–5.6)
Mean Plasma Glucose: 105.41 mg/dL

## 2022-02-18 MED ORDER — INFLUENZA VAC A&B SA ADJ QUAD 0.5 ML IM PRSY
0.5000 mL | PREFILLED_SYRINGE | INTRAMUSCULAR | Status: AC
  Administered 2022-02-19: 0.5 mL via INTRAMUSCULAR
  Filled 2022-02-18: qty 0.5

## 2022-02-18 MED ORDER — ENSURE ENLIVE PO LIQD
237.0000 mL | Freq: Two times a day (BID) | ORAL | Status: DC
  Administered 2022-02-18 – 2022-02-20 (×2): 237 mL via ORAL

## 2022-02-18 NOTE — TOC Initial Note (Addendum)
Transition of Care [facility]) - Initial/Assessment Note    Patient Details  Name: Joanne Gomez MRN: [id number] Date of Birth: Jun 20, 1930  [facility]) CM/SW Contact:    Izola Price, RN Phone Number: 02/18/2022, 2:05 PM  Clinical Narrative: 9/24: POD #1 [place] in [place]. Spoke with patient and patient's daughter Tye [place]. Patient currently lives in [place] at [place] in a separate apartment. Her husband has Parkinson's and he is dependent on her to some degree. They do not wish to progress to the ALF part of [place] [place] and they have been discussing options, such a [facility] or [facility], prior to her fall and this admission.  Daughter had Internet access and was directed to Medicare.gov for STR/SNF choices and they would like to try for a ALF that has STR that could end with them still being together after rehab. They do have private services come in to help with husband during the night but patient takes care of him during the day. Discussed broad bed search while they discuss/research best options for both. Has never been to a SNF and was independent with use of any DME prior to this fall and hip repair surgery. Permission given to begin bed search in [place] area. Simmie Davies RN CM 200 pm.  Gerrit Heck (Daughter)  [phone] ([place]) PCP: Dr. Jenna Luo  [facility] - [place], [place] - [place] RN CM  New PASRR# [id number] A 02/18/2022.      Patient Goals and CMS Choice        Expected Discharge Plan and Services                                                Prior Living Arrangements/Services                       Activities of Daily Living Home Assistive Devices/Equipment: Eyeglasses ADL Screening (condition at time of admission) Patient's cognitive ability adequate to safely complete daily activities?: Yes Is the patient deaf or have difficulty hearing?: No Does the patient have difficulty  seeing, even when wearing glasses/contacts?: No Does the patient have difficulty concentrating, remembering, or making decisions?: No Patient able to express need for assistance with ADLs?: Yes Does the patient have difficulty dressing or bathing?: Yes Independently performs ADLs?: No Does the patient have difficulty walking or climbing stairs?: Yes Weakness of Legs: Right Weakness of Arms/Hands: None  Permission Sought/Granted                  Emotional Assessment              Admission diagnosis:  Closed nondisplaced intertrochanteric fracture of right femur, initial encounter ([place]) [S72.144A] Fall, initial encounter [W19.XXXA] Intertrochanteric fracture of right femur, closed, initial encounter [facility]) [S72.141A] Patient Active Problem List   Diagnosis Date Noted   Acute postoperative anemia due to expected blood loss 02/18/2022   Hyperglycemia 02/18/2022   Intertrochanteric fracture of right femur, closed, initial encounter ([place]) 02/16/2022   Elevated troponin 02/16/2022   DDD (degenerative disc disease), cervical 02/27/2021   Near syncope 09/20/2020   Chest pain at rest 03/26/2014   GERD (gastroesophageal reflux disease) 03/26/2014   Essential hypertension 03/26/2014   Osteoporosis    Hypothyroidism    H/O colon cancer, stage II 11/15/2011  PCP:  Susy Frizzle, MD Pharmacy:   [place], [place] - [address] [address] Phone: [phone] Fax: [phone]     Social Determinants of Health (SDOH) Interventions    Readmission Risk Interventions     No data to display

## 2022-02-18 NOTE — Assessment & Plan Note (Signed)
No history of diabetes.  Mild.  Will check A1c.

## 2022-02-18 NOTE — Evaluation (Signed)
Physical Therapy Evaluation Patient Details Name: Joanne Gomez MRN: [id number] DOB: April 16, 1931 Today's Date: 02/18/2022  History of Present Illness  Pt is an 88 y/o F admitted on 02/16/22 after a fall & inabiilty to stand 2/2 R hip pain. CT showed R hip acute comminuted intertrochanteric fx of proximal R femur. Pt underwent IM fixation on 02/17/22. PMH: hypothyroidism, HTN  Clinical Impression  Pt seen for PT evaluation with pt's family present but exiting upon PT arrival. Prior to admission pt was independent without AD residing with her husband in an ALF. Provided pt with HEP handout & pt performed all exercises with AAROM PRN & cuing for technique. Pt initiates supine>sit but ultimately requires up to max assist +2 to come to sitting EOB with use of bed rails & HOB elevated. Pt is able to complete STS & step pivot to recliner with RW & min assist +2 with cuing for hand placement. Further gait limited by pt's c/o lightheadedness with change in position. Pt would benefit from STR upon d/c to maximize independence with functional mobility & reduce fall risk prior to return home.   Recommendations for follow up therapy are one component of a multi-disciplinary discharge planning process, led by the attending physician.  Recommendations may be updated based on patient status, additional functional criteria and insurance authorization.  Follow Up Recommendations Skilled nursing-short term rehab (<3 hours/day) Can patient physically be transported by private vehicle: No    Assistance Recommended at Discharge Frequent or constant Supervision/Assistance  Patient can return home with the following  A lot of help with walking and/or transfers;A lot of help with bathing/dressing/bathroom;Assist for transportation;Assistance with cooking/housework;Help with stairs or ramp for entrance    Equipment Recommendations Rolling walker (2 wheels)  Recommendations for Other Services       Functional Status  Assessment Patient has had a recent decline in their functional status and demonstrates the ability to make significant improvements in function in a reasonable and predictable amount of time.     Precautions / Restrictions Precautions Precautions: Fall Restrictions Weight Bearing Restrictions: Yes RLE Weight Bearing: Weight bearing as tolerated      Mobility  Bed Mobility Overal bed mobility: Needs Assistance Bed Mobility: Supine to Sit     Supine to sit: Max assist, +2 for physical assistance, HOB elevated     General bed mobility comments: Pt requires assistance to move RLE to EOB, initiates moving LLE to EOB but ultimately requires +2 assist to come to sitting EOB 2/2 R hip pain.    Transfers Overall transfer level: Needs assistance Equipment used: Rolling walker (2 wheels) Transfers: Sit to/from Stand, Bed to chair/wheelchair/BSC Sit to Stand: Min assist, +2 physical assistance   Step pivot transfers: Min assist, +2 physical assistance       General transfer comment: Pt with good weight shifting L<>R, min cuing for turning RW. Cuing for hand placement during STS.    Ambulation/Gait                  Stairs            Wheelchair Mobility    Modified Rankin (Stroke Patients Only)       Balance Overall balance assessment: Needs assistance Sitting-balance support: Feet supported, Bilateral upper extremity supported Sitting balance-Leahy Scale: Fair Sitting balance - Comments: supervision static sitting   Standing balance support: Reliant on assistive device for balance, Bilateral upper extremity supported, During functional activity Standing balance-Leahy Scale: Fair  Pertinent Vitals/Pain Pain Assessment Pain Assessment: 0-10 Pain Score: 5  Pain Location: R hip Pain Descriptors / Indicators: Discomfort, Grimacing Pain Intervention(s): Monitored during session, Limited activity within patient's  tolerance, Repositioned, Patient requesting pain meds-RN notified    Home Living Family/patient expects to be discharged to:: Assisted living Living Arrangements: Spouse/significant other Available Help at Discharge: Available 24 hours/day Type of Home: Assisted living           Home Equipment: Shower seat;BSC/3in1      Prior Function Prior Level of Function : Needs assist       Physical Assist : ADLs (physical)   ADLs (physical): IADLs Mobility Comments: Pt was independent without AD, no other falls besides this one ADLs Comments: generally MOD I in ADL, has assist for IADLs at ALF(meals, cooking, cleaning)     Hand Dominance        Extremity/Trunk Assessment   Upper Extremity Assessment Upper Extremity Assessment: Overall WFL for tasks assessed    Lower Extremity Assessment Lower Extremity Assessment: Generalized weakness;RLE deficits/detail RLE Deficits / Details: 3/5 knee extension in sitting, pain limited       Communication   Communication: HOH  Cognition Arousal/Alertness: Awake/alert Behavior During Therapy: WFL for tasks assessed/performed Overall Cognitive Status: Within Functional Limits for tasks assessed                                          General Comments General comments (skin integrity, edema, etc.): BP in LUE sitting EOB: 107/59 mmHg MAP 74 HR 75 bpm, BP sitting in recliner after transfer in LUE: 100/56 mmHg MAP 71, HR 83 bpm. Pt with c/o lightheadedness upon sitting EOB & during transfer to recliner.    Exercises General Exercises - Lower Extremity Ankle Circles/Pumps: AROM, Both, Supine, 10 reps Quad Sets: AROM, Strengthening, Right, 10 reps, Supine Gluteal Sets: AROM, Strengthening, 10 reps, Supine Short Arc Quad: AROM, Strengthening, Right, 10 reps, Supine Long Arc Quad: AROM, Strengthening, Right, 10 reps, Seated Heel Slides: AAROM, Strengthening, Right, 10 reps, Supine Hip ABduction/ADduction: AAROM,  Strengthening, Right, 10 reps, Supine (hip abduction slides x 10 AAROM, hip adduction pillow squeezes x 10) Straight Leg Raises: Strengthening, AAROM, Supine, Right, 10 reps   Assessment/Plan    PT Assessment Patient needs continued PT services  PT Problem List Decreased strength;Decreased coordination;Pain;Decreased range of motion;Decreased activity tolerance;Decreased balance;Decreased mobility;Decreased knowledge of precautions;Decreased safety awareness;Decreased knowledge of use of DME       PT Treatment Interventions DME instruction;Therapeutic exercise;Gait training;Balance training;Stair training;Neuromuscular re-education;Modalities;Manual techniques;Functional mobility training;Patient/family education;Therapeutic activities    PT Goals (Current goals can be found in the Care Plan section)  Acute Rehab PT Goals Patient Stated Goal: get better, return home PT Goal Formulation: With patient Time For Goal Achievement: 03/04/22 Potential to Achieve Goals: Good    Frequency BID     Co-evaluation PT/OT/SLP Co-Evaluation/Treatment: Yes Reason for Co-Treatment: For patient/therapist safety;To address functional/ADL transfers PT goals addressed during session: Mobility/safety with mobility;Balance;Proper use of DME;Strengthening/ROM         AM-PAC PT "6 Clicks" Mobility  Outcome Measure Help needed turning from your back to your side while in a flat bed without using bedrails?: A Lot Help needed moving from lying on your back to sitting on the side of a flat bed without using bedrails?: Total Help needed moving to and from a bed to a chair (including a wheelchair)?: A  Little Help needed standing up from a chair using your arms (e.g., wheelchair or bedside chair)?: A Little Help needed to walk in hospital room?: A Lot Help needed climbing 3-5 steps with a railing? : Total 6 Click Score: 12    End of Session   Activity Tolerance: Patient tolerated treatment well Patient  left: in chair;with chair alarm set;with call bell/phone within reach (in care of OT) Nurse Communication: Mobility status PT Visit Diagnosis: Unsteadiness on feet (R26.81);Muscle weakness (generalized) (M62.81);Pain;Difficulty in walking, not elsewhere classified (R26.2) Pain - Right/Left: Right Pain - part of body: Hip    Time: 1040-1110 PT Time Calculation (min) (ACUTE ONLY): 30 min   Charges:   PT Evaluation $PT Eval Low Complexity: 1 Low PT Treatments $Therapeutic Exercise: 8-22 mins        Lavone Nian, PT, DPT 02/18/22, 11:19 AM   Waunita Schooner 02/18/2022, 11:18 AM

## 2022-02-18 NOTE — Progress Notes (Signed)
Subjective:  POD #1 s/p intramedullary fixation for right intertrochanteric hip fracture.   Patient reports right hip pain as mild.  Patient up out of bed to a chair this morning.  Her daughter is at the bedside.  Objective:   VITALS:   Vitals:   02/17/22 2115 02/18/22 0007 02/18/22 0450 02/18/22 0731  BP: (!) 116/56 (!) 111/57 (!) 111/54 (!) 102/46  Pulse: 90 88 80 71  Resp:  '16 17 17  '$ Temp:  99 F (37.2 C) 98.2 F (36.8 C) 97.9 F (36.6 C)  TempSrc:      SpO2:  95% 96% 99%  Weight:      Height:        PHYSICAL EXAM: Right lower extremity Neurovascular intact Sensation intact distally Intact pulses distally Dorsiflexion/Plantar flexion intact Incision: dressing C/D/I No cellulitis present Compartment soft  LABS  Results for orders placed or performed during the hospital encounter of 02/16/22 (from the past 24 hour(s))  Basic metabolic panel     Status: Abnormal   Collection Time: 02/18/22  5:39 AM  Result Value Ref Range   Sodium 138 135 - 145 mmol/L   Potassium 3.8 3.5 - 5.1 mmol/L   Chloride 102 98 - 111 mmol/L   CO2 27 22 - 32 mmol/L   Glucose, Bld 159 (H) 70 - 99 mg/dL   BUN 29 (H) 8 - 23 mg/dL   Creatinine, Ser 0.89 0.44 - 1.00 mg/dL   Calcium 8.7 (L) 8.9 - 10.3 mg/dL   GFR, Estimated >60 >60 mL/min   Anion gap 9 5 - 15  CBC     Status: Abnormal   Collection Time: 02/18/22  5:39 AM  Result Value Ref Range   WBC 11.7 (H) 4.0 - 10.5 K/uL   RBC 3.41 (L) 3.87 - 5.11 MIL/uL   Hemoglobin 9.8 (L) 12.0 - 15.0 g/dL   HCT 30.5 (L) 36.0 - 46.0 %   MCV 89.4 80.0 - 100.0 fL   MCH 28.7 26.0 - 34.0 pg   MCHC 32.1 30.0 - 36.0 g/dL   RDW 13.7 11.5 - 15.5 %   Platelets 131 (L) 150 - 400 K/uL   nRBC 0.0 0.0 - 0.2 %    DG Hip Port Unilat With Pelvis 1V Right  Result Date: 02/17/2022 CLINICAL DATA:  Hip fracture status post intramedullary nail EXAM: DG HIP (WITH OR WITHOUT PELVIS) 1V PORT RIGHT COMPARISON:  Right hip x-ray 02/16/2022 FINDINGS: There is a new short  stem right femoral intramedullary nail and hip screw fixating intratrochanteric fracture. Alignment is anatomic. There is lateral soft tissue swelling, air and skin staples compatible with recent surgery. Joint spaces are well maintained. IMPRESSION: ORIF right hip intratrochanteric fracture.  Alignment is anatomic. Electronically Signed   By: Ronney Asters M.D.   On: 02/17/2022 19:15   DG HIP UNILAT WITH PELVIS 2-3 VIEWS RIGHT  Result Date: 02/17/2022 CLINICAL DATA:  Surgery EXAM: DG HIP (WITH OR WITHOUT PELVIS) 2-3V RIGHT COMPARISON:  Right hip x-ray 02/16/2022 FINDINGS: Intraoperative right hip. Four low resolution intraoperative spot views of the right hip were obtained. There is a new right femoral intramedullary nail and hip screw present fixating proximal right femoral fracture. Alignment is anatomic. Total fluoroscopy time: 1 minute 14 seconds Total radiation dose: Not recorded IMPRESSION: ORIF right femoral intratrochanteric fracture. Electronically Signed   By: Ronney Asters M.D.   On: 02/17/2022 19:10   DG C-Arm 1-60 Min-No Report  Result Date: 02/17/2022 Fluoroscopy was utilized by the requesting  physician.  No radiographic interpretation.   ECHOCARDIOGRAM COMPLETE  Result Date: 02/17/2022    ECHOCARDIOGRAM REPORT   Patient Name:   Joanne Gomez [facility] Date of Exam: 02/17/2022 Medical Rec #:  [id number]       Height:       64.0 in Accession #:    [id number]      Weight:       112.0 lb Date of Birth:  1930-12-16       BSA:          1.529 m Patient Age:    88 years        BP:           121/62 mmHg Patient Gender: F               HR:           71 bpm. Exam Location:  [facility] Procedure: 2D Echo Indications:     Elevated Troponin  History:         Patient has prior history of Echocardiogram examinations, most                  recent 10/30/2019.  Sonographer:     [name] Referring Phys:  [place] Diagnosing Phys: Serafina Royals MD IMPRESSIONS  1. Left ventricular ejection fraction,  by estimation, is 60 to 65%. The left ventricle has normal function. The left ventricle has no regional wall motion abnormalities. Left ventricular diastolic parameters were normal.  2. Right ventricular systolic function is normal. The right ventricular size is normal.  3. The mitral valve is normal in structure. Trivial mitral valve regurgitation.  4. The aortic valve is normal in structure. Aortic valve regurgitation is trivial. FINDINGS  Left Ventricle: Left ventricular ejection fraction, by estimation, is 60 to 65%. The left ventricle has normal function. The left ventricle has no regional wall motion abnormalities. The left ventricular internal cavity size was normal in size. There is  no left ventricular hypertrophy. Left ventricular diastolic parameters were normal. Right Ventricle: The right ventricular size is normal. No increase in right ventricular wall thickness. Right ventricular systolic function is normal. Left Atrium: Left atrial size was normal in size. Right Atrium: Right atrial size was normal in size. Pericardium: There is no evidence of pericardial effusion. Mitral Valve: The mitral valve is normal in structure. Trivial mitral valve regurgitation. Tricuspid Valve: The tricuspid valve is normal in structure. Tricuspid valve regurgitation is trivial. Aortic Valve: The aortic valve is normal in structure. Aortic valve regurgitation is trivial. Aortic valve peak gradient measures 8.1 mmHg. Pulmonic Valve: The pulmonic valve was normal in structure. Pulmonic valve regurgitation is trivial. Aorta: The aortic root and ascending aorta are structurally normal, with no evidence of dilitation. IAS/Shunts: No atrial level shunt detected by color flow Doppler.  LEFT VENTRICLE PLAX 2D LVIDd:         2.40 cm     Diastology LVIDs:         1.70 cm     LV e' medial:    7.51 cm/s LV PW:         1.20 cm     LV E/e' medial:  13.3 LV IVS:        1.30 cm     LV e' lateral:   7.67 cm/s LVOT diam:     1.70 cm     LV  E/e' lateral: 13.0 LV SV:         45 LV  SV Index:   29 LVOT Area:     2.27 cm  LV Volumes (MOD) LV vol d, MOD A2C: 24.0 ml LV vol d, MOD A4C: 29.5 ml LV vol s, MOD A2C: 10.3 ml LV vol s, MOD A4C: 10.0 ml LV SV MOD A2C:     13.7 ml LV SV MOD A4C:     29.5 ml LV SV MOD BP:      16.3 ml RIGHT VENTRICLE RV Basal diam:  3.00 cm RV S prime:     13.75 cm/s TAPSE (M-mode): 2.2 cm LEFT ATRIUM             Index        RIGHT ATRIUM          Index LA diam:        2.30 cm 1.50 cm/m   RA Area:     9.92 cm LA Vol (A2C):   14.4 ml 9.42 ml/m   RA Volume:   21.50 ml 14.06 ml/m LA Vol (A4C):   19.5 ml 12.75 ml/m LA Biplane Vol: 17.9 ml 11.71 ml/m  AORTIC VALVE                 PULMONIC VALVE AV Area (Vmax): 1.55 cm     PV Vmax:          0.81 m/s AV Vmax:        142.00 cm/s  PV Peak grad:     2.6 mmHg AV Peak Grad:   8.1 mmHg     PR End Diast Vel: 4.33 msec LVOT Vmax:      96.90 cm/s LVOT Vmean:     61.800 cm/s LVOT VTI:       0.197 m  AORTA Ao Root diam: 2.90 cm Ao Asc diam:  2.80 cm MITRAL VALVE               TRICUSPID VALVE MV Area (PHT): 3.28 cm    TR Peak grad:   34.8 mmHg MV Decel Time: 231 msec    TR Vmax:        295.00 cm/s MV E velocity: 99.45 cm/s MV A velocity: 97.75 cm/s  SHUNTS MV E/A ratio:  1.02        Systemic VTI:  0.20 m                            Systemic Diam: 1.70 cm Serafina Royals MD Electronically signed by Serafina Royals MD Signature Date/Time: 02/17/2022/10:23:06 AM    Final    CT Hip Right Wo Contrast  Result Date: 02/16/2022 CLINICAL DATA:  Fall.  Right hip pain EXAM: CT OF THE RIGHT HIP WITHOUT CONTRAST TECHNIQUE: Multidetector CT imaging of the right hip was performed according to the standard protocol. Multiplanar CT image reconstructions were also generated. RADIATION DOSE REDUCTION: This exam was performed according to the departmental dose-optimization program which includes automated exposure control, adjustment of the mA and/or kV according to patient size and/or use of iterative  reconstruction technique. COMPARISON:  X-ray 02/16/2022 FINDINGS: Bones/Joint/Cartilage Acute comminuted intertrochanteric fracture of the proximal right femur. Minimal displacement of the greater trochanteric fracture component. Fracture is otherwise relatively nondisplaced. No dislocation of the right hip. 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 Included portion of the right hemipelvis is intact without fracture or diastasis. Bones are demineralized. No lytic or sclerotic bone lesion is evident. Ligaments Suboptimally assessed by CT. Muscles and Tendons No acute musculotendinous abnormality  by CT. Soft tissues No fluid collection or hematoma within the soft tissues. No right inguinal lymphadenopathy. IMPRESSION: Acute comminuted intertrochanteric fracture of the proximal right femur, as above. Electronically Signed   By: Davina Poke D.O.   On: 02/16/2022 18:33   DG Chest 1 View  Result Date: 02/16/2022 CLINICAL DATA:  Status post fall on the right-side. EXAM: CHEST  1 VIEW COMPARISON:  Oct 19, 2019 FINDINGS: The heart size and mediastinal contours are within normal limits. The lungs are hyperinflated. Mild, diffuse, chronic appearing increased lung markings are seen. There is no evidence of acute infiltrate, pleural effusion or pneumothorax. The visualized skeletal structures are unremarkable. IMPRESSION: Chronic-appearing increased lung markings without acute or active cardiopulmonary disease. Electronically Signed   By: Virgina Norfolk M.D.   On: 02/16/2022 17:22   DG Hip Unilat W or Wo Pelvis 2-3 Views Right  Result Date: 02/16/2022 CLINICAL DATA:  Right hip pain after falling. EXAM: DG HIP (WITH OR WITHOUT PELVIS) 2-3V RIGHT COMPARISON:  Abdominal radiographs 07/01/2018. Pelvic CT 11/12/2011. FINDINGS: The bones are demineralized. The right hip is flexed and adducted with resulting suboptimal evaluation of the femoral neck and trochanteric region. No definite acute femur fracture is  confirmed. No evidence of dislocation or pelvic fracture. IMPRESSION: Limited examination due to positioning. No definite acute fracture identified. Given the limitations of this study, further evaluation with CT of the right hip is recommended. Electronically Signed   By: Richardean Sale M.D.   On: 02/16/2022 17:11    Assessment/Plan: 1 Day Post-Op   Principal Problem:   Intertrochanteric fracture of right femur, closed, initial encounter ([place]) Active Problems:   Hypothyroidism   Osteoporosis   GERD (gastroesophageal reflux disease)   Essential hypertension   Elevated troponin   Acute postoperative anemia due to expected blood loss   Hyperglycemia  Patient doing well postop.  Hemoglobin is 9.8 today.  Patient will continue with physical therapy.  Labs will be rechecked tomorrow.  Patient will begin Lovenox for DVT prophylaxis.    Thornton Park , MD 02/18/2022, 12:02 PM

## 2022-02-18 NOTE — TOC Progression Note (Signed)
Transition of Care [facility]) - Progression Note    Patient Details  Name: Joanne Gomez MRN: [id number] Date of Birth: 1930-11-23  Transition of Care [facility]) CM/SW Contact  Izola Price, RN Phone Number: 02/18/2022, 2:35 PM  Clinical Narrative:  9/24: With permission from daughter, bed search started in [place]/ area with an eye on a STR to ALF situation where spouses can stay together (see Initial TOC note). Simmie Davies RN CM           Expected Discharge Plan and Services                                                 Social Determinants of Health (SDOH) Interventions    Readmission Risk Interventions     No data to display

## 2022-02-18 NOTE — Evaluation (Signed)
Occupational Therapy Evaluation Patient Details Name: Joanne Gomez MRN: [id number] DOB: [birth year] Today's Date: 02/18/2022   History of Present Illness Pt is an 88 y/o F admitted on 02/16/22 after a fall & inabiilty to stand 2/2 R hip pain. CT showed R hip acute comminuted intertrochanteric fx of proximal R femur. Pt underwent IM fixation on 02/17/22. PMH: hypothyroidism, HTN   Clinical Impression   Chart reviewed, pt greeted in bed, agreeable to OT evaluation. Pt is alert and oriented x4. Co tx completed with PT on this date. PTA pt was amb no AD, MOD I in ADL, assist with cooking/cleaning/driving, lives at [facility]. Pt presents with deficits in strength, activity tolerance, balance, endurance. Pt requires MAX A +2 for bed mobility, STS with MIN A +2, step pivot transfer to bedside chair with MIN A +2 with RW, intermittent vcs for technique/RW use. Pt performs grooming tasks with SET UP, LB dressing with MAX A. Pt is left in bedside chair, all needs met. Recommend STR to address functional deficits, return to PLOF. OT will continue to follow acutely.      Recommendations for follow up therapy are one component of a multi-disciplinary discharge planning process, led by the attending physician.  Recommendations may be updated based on patient status, additional functional criteria and insurance authorization.   Follow Up Recommendations  Skilled nursing-short term rehab (<3 hours/day)    Assistance Recommended at Discharge Frequent or constant Supervision/Assistance  Patient can return home with the following A lot of help with walking and/or transfers;A lot of help with bathing/dressing/bathroom    Functional Status Assessment  Patient has had a recent decline in their functional status and demonstrates the ability to make significant improvements in function in a reasonable and predictable amount of time.  Equipment Recommendations  Other (comment) (per next venue of care)    Recommendations  for Other Services       Precautions / Restrictions Precautions Precautions: Fall Restrictions Weight Bearing Restrictions: Yes RLE Weight Bearing: Weight bearing as tolerated      Mobility Bed Mobility Overal bed mobility: Needs Assistance Bed Mobility: Supine to Sit     Supine to sit: Max assist, +2 for physical assistance, HOB elevated          Transfers Overall transfer level: Needs assistance Equipment used: Rolling walker (2 wheels) Transfers: Sit to/from Stand, Bed to chair/wheelchair/BSC Sit to Stand: Min assist, +2 physical assistance     Step pivot transfers: Min assist, +2 physical assistance     General transfer comment: intermittent vcs for technique, RW use      Balance Overall balance assessment: Needs assistance Sitting-balance support: Feet supported, Bilateral upper extremity supported Sitting balance-Leahy Scale: Fair     Standing balance support: Reliant on assistive device for balance, Bilateral upper extremity supported, During functional activity Standing balance-Leahy Scale: Fair                             ADL either performed or assessed with clinical judgement   ADL Overall ADL's : Needs assistance/impaired Eating/Feeding: Set up;Sitting   Grooming: Set up;Sitting               Lower Body Dressing: Maximal assistance       Toileting- Clothing Manipulation and Hygiene: Minimal assistance;+2 for safety/equipment               Vision Baseline Vision/History: 1 Wears glasses Patient Visual Report: No change from baseline  Perception     Praxis      Pertinent Vitals/Pain Pain Assessment Pain Assessment: 0-10 Pain Score: 5  Pain Location: R hip Pain Descriptors / Indicators: Discomfort, Grimacing Pain Intervention(s): Limited activity within patient's tolerance, Monitored during session, Patient requesting pain meds-RN notified, Repositioned     Hand Dominance     Extremity/Trunk  Assessment Upper Extremity Assessment Upper Extremity Assessment: Generalized weakness   Lower Extremity Assessment Lower Extremity Assessment: Generalized weakness;RLE deficits/detail RLE Deficits / Details: s/p R IMN       Communication Communication Communication: HOH   Cognition Arousal/Alertness: Awake/alert Behavior During Therapy: WFL for tasks assessed/performed Overall Cognitive Status: Within Functional Limits for tasks assessed                                       General Comments  BP 107/59 (MAP74) HR 75 seated on edge of bed; BP in recliner 100/56 (MAP 71), HR 83; pt with lightheadedness sitting on edge of bed    Exercises     Shoulder Instructions      Home Living Family/patient expects to be discharged to:: Assisted living Living Arrangements: Spouse/significant other Available Help at Discharge: Available 24 hours/day Type of Home: Assisted living             Bathroom Shower/Tub: Walk-in shower     Bathroom Accessibility: Yes   Home Equipment: Shower seat;BSC/3in1          Prior Functioning/Environment Prior Level of Function : Needs assist       Physical Assist : ADLs (physical)   ADLs (physical): IADLs Mobility Comments: Pt was independent without AD, no other falls besides this one ADLs Comments: generally MOD I in ADL, has assist for IADLs at ALF(meals, cooking, cleaning)        OT Problem List: Decreased strength;Decreased activity tolerance;Decreased knowledge of use of DME or AE      OT Treatment/Interventions: Self-care/ADL training;Visual/perceptual remediation/compensation;Patient/family education;Therapeutic exercise;Balance training;Energy conservation;Therapeutic activities;DME and/or AE instruction    OT Goals(Current goals can be found in the care plan section) Acute Rehab OT Goals Patient Stated Goal: go to rehab OT Goal Formulation: With patient/family Time For Goal Achievement: 03/04/22 Potential  to Achieve Goals: Good  OT Frequency: Min 2X/week    Co-evaluation PT/OT/SLP Co-Evaluation/Treatment: Yes Reason for Co-Treatment: For patient/therapist safety;To address functional/ADL transfers PT goals addressed during session: Mobility/safety with mobility;Balance;Proper use of DME;Strengthening/ROM OT goals addressed during session: ADL's and self-care      AM-PAC OT "6 Clicks" Daily Activity     Outcome Measure Help from another person eating meals?: None Help from another person taking care of personal grooming?: None Help from another person toileting, which includes using toliet, bedpan, or urinal?: A Lot Help from another person bathing (including washing, rinsing, drying)?: A Lot Help from another person to put on and taking off regular upper body clothing?: A Little Help from another person to put on and taking off regular lower body clothing?: A Lot 6 Click Score: 17   End of Session Equipment Utilized During Treatment: Gait belt;Rolling walker (2 wheels) Nurse Communication: Mobility status  Activity Tolerance: Patient tolerated treatment well Patient left: in chair;with call bell/phone within reach;with chair alarm set  OT Visit Diagnosis: History of falling (Z91.81);Unsteadiness on feet (R26.81)                Time: 3128-1188 OT Time Calculation (min): 28  min Charges:  OT General Charges $OT Visit: 1 Visit OT Evaluation $OT Eval Low Complexity: 1 Low OT Treatments $Self Care/Home Management : 8-22 mins Shanon Payor, OTD OTR/L  02/18/22, 1:06 PM

## 2022-02-18 NOTE — Progress Notes (Signed)
Initial Nutrition Assessment RD working remotely.  DOCUMENTATION CODES:   Not applicable  INTERVENTION:  - will order Ensure Plus High Protein po BID, each supplement provides 350 kcal and 20 grams of protein.  - complete NFPE when feasible.   NUTRITION DIAGNOSIS:   Increased nutrient needs related to post-op healing as evidenced by estimated needs.  GOAL:   Patient will meet greater than or equal to 90% of their needs  MONITOR:   PO intake, Supplement acceptance, Labs, Weight trends  REASON FOR ASSESSMENT:   Consult Hip fracture protocol  ASSESSMENT:   88 y.o. female with medical history of hypothyroidism, HTN, hiatal hernia, colon cancer, HLD, osteoporosis, and GERD. She presented to the ED via EMS due to R hip pain following a fall. At baseline she is able to ambulate unassisted.  Patient is POD #1 IM fixation for R hip fx. Diet advanced from NPO to Regular yesterday at 1922; no meal completion percentages documented in the flowsheet yet.  She has not been assessed by a [facility] RD at any time in the past.  Weight on 9/22 was 112 lb and weight has ben stable since 06/09/21. No information documented in the edema section of flow sheet.   Labs reviewed; BUN: 29 mg/dl, Ca: 8.7 mg/dl.  Medications reviewed; 2000 units cholecalciferol/day, 5000 mcg oral cyanocobalamin/day, 100 mg colace BID, 200 mg mag-ox/day, 1 tablet multivitamin with minerals/day, 40 mg oral protonix/day, 1 tablet senokot BID.    NUTRITION - FOCUSED PHYSICAL EXAM:  RD working remotely.  Diet Order:   Diet Order             Diet regular Room service appropriate? Yes; Fluid consistency: Thin  Diet effective now                   EDUCATION NEEDS:   No education needs have been identified at this time  Skin:  Skin Assessment: Skin Integrity Issues: Skin Integrity Issues:: Incisions Incisions: R hip (02/17/22)  Last BM:  PTA/unknown  Height:   Ht Readings from Last 1  Encounters:  02/16/22 '5\' 4"'$  (1.626 m)    Weight:   Wt Readings from Last 1 Encounters:  02/16/22 50.8 kg     BMI:  Body mass index is 19.22 kg/m.  Estimated Nutritional Needs:  Kcal:  1400-1600 kcal Protein:  70-80 grams Fluid:  >/= 1.5 L/day      Jarome Matin, MS, RD, LDN, CNSC Clinical Dietitian PRN/Relief staff On-call/weekend pager # available in [facility]

## 2022-02-18 NOTE — Assessment & Plan Note (Signed)
Hemoglobin down to 9.8 from 11.7 preop.  Continue to monitor.  Transfuse for hemoglobin below 7.0.

## 2022-02-18 NOTE — NC FL2 (Signed)
[place] LEVEL OF CARE SCREENING TOOL     IDENTIFICATION  Patient Name: Joanne Gomez Birthdate: 06-Dec-1930 Sex: female Admission Date (Current Location): 02/16/2022  [facility] and [place] Number:  Engineering geologist and Address:  [facility], [address], [place], [place] [address]      Provider Number: [id number]  Attending Physician Name and Address:  Annita Brod, MD  Relative Name and Phone Number:  Gerrit Heck (Daughter)   [phone] [facility])    Current Level of Care: Hospital Recommended Level of Care: [place] Prior Approval Number:    Date Approved/Denied:   PASRR Number: [id number] (New on 02/18/22)  Discharge Plan: SNF    Current Diagnoses: Patient Active Problem List   Diagnosis Date Noted   Acute postoperative anemia due to expected blood loss 02/18/2022   Hyperglycemia 02/18/2022   Intertrochanteric fracture of right femur, closed, initial encounter ([place]) 02/16/2022   Elevated troponin 02/16/2022   DDD (degenerative disc disease), cervical 02/27/2021   Near syncope 09/20/2020   Chest pain at rest 03/26/2014   GERD (gastroesophageal reflux disease) 03/26/2014   Essential hypertension 03/26/2014   Osteoporosis    Hypothyroidism    H/O colon cancer, stage II 11/15/2011    Orientation RESPIRATION BLADDER Height & Weight     Self, Time, Situation, Place  Normal Continent Weight: 50.8 kg Height:  '5\' 4"'$  (162.6 cm)  BEHAVIORAL SYMPTOMS/MOOD NEUROLOGICAL BOWEL NUTRITION STATUS      Continent Diet  AMBULATORY STATUS COMMUNICATION OF NEEDS Skin   Extensive Assist Verbally Surgical wounds                       Personal Care Assistance Level of Assistance  Bathing, Dressing Bathing Assistance: Limited assistance   Dressing Assistance: Maximum assistance     Functional Limitations Info  Sight Sight Info: Impaired (Corrective glasses)        SPECIAL CARE FACTORS FREQUENCY   PT (By licensed PT), OT (By licensed OT)     PT Frequency: 5x/week OT Frequency: 5x/week            Contractures Contractures Info: Not present    Additional Factors Info  Code Status, Allergies Code Status Info: DNR Allergies Info: No Known Allergies/please check EMR for updates.           Current Medications (02/18/2022):  This is the current hospital active medication list Current Facility-Administered Medications  Medication Dose Route Frequency Provider Last Rate Last Admin   acetaminophen (TYLENOL) tablet 325-650 mg  325-650 mg Oral Q6H PRN Thornton Park, MD       alum & mag hydroxide-simeth (MAALOX/MYLANTA) 200-200-20 MG/5ML suspension 30 mL  30 mL Oral Q4H PRN Thornton Park, MD       amLODipine (NORVASC) tablet 10 mg  10 mg Oral Daily [place], MD   10 mg at 02/18/22 0946   aspirin EC tablet 81 mg  81 mg Oral Daily [place] Park, MD   81 mg at 02/18/22 8413   bisacodyl (DULCOLAX) suppository 10 mg  10 mg Rectal Daily PRN [place], MD       calcium citrate (CALCITRATE - dosed in mg elemental calcium) tablet 200 mg of elemental calcium  200 mg of elemental calcium Oral BID [place], MD   200 mg of elemental calcium at 02/18/22 0947   cholecalciferol (VITAMIN D3) 25 MCG (1000 UNIT) tablet 2,000 Units  2,000 Units Oral Daily [place] Park, MD  2,000 Units at 02/18/22 0947   cyanocobalamin (VITAMIN B12) tablet 5,000 mcg  5,000 mcg Oral Daily [place] Park, MD   5,000 mcg at 02/18/22 0946   docusate sodium (COLACE) capsule 100 mg  100 mg Oral BID Thornton Park, MD   100 mg at 02/18/22 0946   enoxaparin (LOVENOX) injection 40 mg  40 mg Subcutaneous Q24H [place], MD   40 mg at 02/18/22 0947   feeding supplement (ENSURE ENLIVE / ENSURE PLUS) liquid 237 mL  237 mL Oral BID BM Annita Brod, MD   237 mL at 02/18/22 1203   HYDROcodone-acetaminophen (NORCO/VICODIN) 5-325 MG per tablet 1-2 tablet  1-2 tablet Oral Q4H PRN  [place], MD   1 tablet at 02/18/22 0445   magnesium oxide (MAG-OX) tablet 200 mg  200 mg Oral Daily Thornton Park, MD   200 mg at 02/18/22 7619   menthol-cetylpyridinium (CEPACOL) lozenge 3 mg  1 lozenge Oral PRN [place] Park, MD       Or   phenol (CHLORASEPTIC) mouth spray 1 spray  1 spray Mouth/Throat PRN [place] Park, MD       methocarbamol (ROBAXIN) tablet 500 mg  500 mg Oral Q6H PRN [place], MD       Or   methocarbamol (ROBAXIN) 500 mg in dextrose 5 % 50 mL IVPB  500 mg Intravenous Q6H PRN [place] Park, MD       metoprolol tartrate (LOPRESSOR) tablet 12.5 mg  12.5 mg Oral BID Thornton Park, MD   12.5 mg at 02/18/22 0945   morphine (PF) 2 MG/ML injection 0.5-1 mg  0.5-1 mg Intravenous Q2H PRN [place] Park, MD       multivitamin with minerals tablet 1 tablet  1 tablet Oral Daily Thornton Park, MD   1 tablet at 02/18/22 0945   ondansetron (ZOFRAN) tablet 4 mg  4 mg Oral Q6H PRN [place] Park, MD       Or   ondansetron [facility]) injection 4 mg  4 mg Intravenous Q6H PRN Thornton Park, MD       pantoprazole (PROTONIX) EC tablet 40 mg  40 mg Oral Daily Thornton Park, MD   40 mg at 02/18/22 0947   polyethylene glycol (MIRALAX / GLYCOLAX) packet 17 g  17 g Oral Daily PRN Thornton Park, MD       senna Donavan Burnet) tablet 8.6 mg  1 tablet Oral BID Thornton Park, MD   8.6 mg at 02/18/22 0947   thyroid (ARMOUR) tablet 90 mg  90 mg Oral Once per day on Sun Tue Thu Sat Thornton Park, MD   90 mg at 02/18/22 5093   And   [START ON 02/19/2022] thyroid (ARMOUR) tablet 60 mg  60 mg Oral Once per day on Mon Wed Fri Thornton Park, MD       traMADol Veatrice Bourbon) tablet 50 mg  50 mg Oral Q6H Thornton Park, MD   50 mg at 02/18/22 1203     Discharge Medications: Please see discharge summary for a list of discharge medications.  Relevant Imaging Results:  Relevant Lab Results:   Additional Information SS# [id number]  Izola Price, RN

## 2022-02-18 NOTE — Progress Notes (Addendum)
[facility] Progress Note  Patient: Joanne Gomez    ZOX:[id number]  DOA: 02/16/2022    Date of Service: the patient was seen and examined on 02/18/2022  Brief hospital course: 88 year old female with past medical history of hypothyroidism and hypertension presented to the emergency room on 9/22 after she had a mechanical fall and landed on her right hip and was unable to stand without pain.  In the emergency room, CT scan of right hip noted acute comminuted intertrochanteric fracture of the proximal right femur.  Patient admitted to the hospitalist service.  Noted to have some mildly elevated troponins and so cardiology consulted cleared patient for surgery.  Troponin is not felt to be ACS.  Orthopedic surgery took patient for intramedullary fixation on afternoon of 9/23.  Patient evaluated by physical therapy who are recommending skilled nursing.  Assessment and Plan: Assessment and Plan: * Intertrochanteric fracture of right femur, closed, initial encounter ([place]) Status post internal fixation.  Monitoring acute blood loss anemia.  Will need skilled nursing.  Pain appears to be moderately well controlled.  Acute postoperative anemia due to expected blood loss Hemoglobin down to 9.8 from 11.7 preop.  Continue to monitor.  Transfuse for hemoglobin below 7.0.  Elevated troponin Chest pain-free.  No shortness of breath.  EKG unremarkable.  Peaked at 356.  Since then has been trending downward.  Not felt to be secondary to ACS.  May have been secondary to hypertension and impact of injury.  Essential hypertension Blood pressure initially elevated on admission, likely secondary to pain.  Since then has been stable.  Continue to monitor.  Continue home medications.  Osteoporosis Suspect a contributing factor to patient's fracture.  Continue calcium and vitamin D.  GERD (gastroesophageal reflux disease) Stable Continue PPI  Hypothyroidism Stable Continue Synthroid  Hyperglycemia No  history of diabetes.  Mild.  Will check A1c.       Body mass index is 19.22 kg/m.  Nutrition Problem: Increased nutrient needs Etiology: post-op healing     Consultants: Orthopedic surgery Cardiology  Procedures: Status post intramedullary fixation 9/23  Antimicrobials: Preop Ancef  Code Status: Full code   Subjective: Tired, a little sore but feeling better.  Objective: Vital signs were reviewed and unremarkable. Vitals:   02/18/22 0450 02/18/22 0731  BP: (!) 111/54 (!) 102/46  Pulse: 80 71  Resp: 17 17  Temp: 98.2 F (36.8 C) 97.9 F (36.6 C)  SpO2: 96% 99%    Intake/Output Summary (Last 24 hours) at 02/18/2022 1145 Last data filed at 02/18/2022 0447 Gross per 24 hour  Intake 700 ml  Output 250 ml  Net 450 ml   Filed Weights   02/16/22 1540  Weight: 50.8 kg   Body mass index is 19.22 kg/m.  Exam:  General: Alert and oriented x3, no acute distress HEENT: Normocephalic, atraumatic, mucous membranes are moist Cardiovascular: Regular rate and rhythm, S1-S2 Respiratory: Clear to auscultation bilaterally Abdomen: Soft, nontender, nondistended, positive bowel sounds Musculoskeletal: No clubbing or cyanosis or edema.   Skin: No skin breaks, tears, lesions Psychiatry: Appropriate, no evidence of psychosis Neurology: No focal deficits  Data Reviewed: White blood cell count 11.7, hemoglobin down to 9.8.  Platelets 131 and glucose of 159.  Disposition:  Status is: Inpatient Remains inpatient appropriate because:  Need short-term skilled nursing    Anticipated discharge date: 9/25 or 62   Family Communication: Several family members at the bedside DVT Prophylaxis: enoxaparin (LOVENOX) injection 40 mg Start: 02/18/22 0800 SCDs Start: 02/17/22 1922  Place TED hose Start: 02/17/22 1922    Author: Annita Brod ,MD 02/18/2022 11:45 AM  To reach On-call, see care teams to locate the attending and reach out via [email or web address]. Between 7PM-7AM,  please contact night-coverage If you still have difficulty reaching the attending provider, please page the [facility] (Director on Call) for [facility] on amion for assistance.

## 2022-02-18 NOTE — Plan of Care (Signed)

## 2022-02-19 ENCOUNTER — Encounter: Payer: Self-pay | Admitting: Orthopedic Surgery

## 2022-02-19 DIAGNOSIS — S72141A Displaced intertrochanteric fracture of right femur, initial encounter for closed fracture: Secondary | ICD-10-CM | POA: Diagnosis not present

## 2022-02-19 LAB — CBC
HCT: 28.2 % — ABNORMAL LOW (ref 36.0–46.0)
Hemoglobin: 9.2 g/dL — ABNORMAL LOW (ref 12.0–15.0)
MCH: 29.3 pg (ref 26.0–34.0)
MCHC: 32.6 g/dL (ref 30.0–36.0)
MCV: 89.8 fL (ref 80.0–100.0)
Platelets: 141 10*3/uL — ABNORMAL LOW (ref 150–400)
RBC: 3.14 MIL/uL — ABNORMAL LOW (ref 3.87–5.11)
RDW: 13.6 % (ref 11.5–15.5)
WBC: 11.5 10*3/uL — ABNORMAL HIGH (ref 4.0–10.5)
nRBC: 0 % (ref 0.0–0.2)

## 2022-02-19 LAB — BASIC METABOLIC PANEL
Anion gap: 4 — ABNORMAL LOW (ref 5–15)
BUN: 27 mg/dL — ABNORMAL HIGH (ref 8–23)
CO2: 31 mmol/L (ref 22–32)
Calcium: 8.6 mg/dL — ABNORMAL LOW (ref 8.9–10.3)
Chloride: 99 mmol/L (ref 98–111)
Creatinine, Ser: 0.79 mg/dL (ref 0.44–1.00)
GFR, Estimated: >60 mL/min (ref >60)
Glucose, Bld: 114 mg/dL — ABNORMAL HIGH (ref 70–99)
Potassium: 4.3 mmol/L (ref 3.5–5.1)
Sodium: 134 mmol/L — ABNORMAL LOW (ref 135–145)

## 2022-02-19 MED ORDER — POLYETHYLENE GLYCOL 3350 17 G PO PACK
17.0000 g | PACK | Freq: Once | ORAL | Status: AC
  Administered 2022-02-19: 17 g via ORAL
  Filled 2022-02-19: qty 1

## 2022-02-19 NOTE — Care Management Important Message (Signed)
Important Message  Patient Details  Name: Joanne Gomez MRN: [id number] Date of Birth: Oct 02, 1930   Medicare Important Message Given:  Yes     Dannette Barbara 02/19/2022, 11:22 AM

## 2022-02-19 NOTE — Progress Notes (Signed)
Physical Therapy Treatment Patient Details Name: Joanne Gomez MRN: [id number] DOB: 01-Mar-1931 Today's Date: 02/19/2022   History of Present Illness Pt is an 88 y/o F admitted on 02/16/22 after a fall & inabiilty to stand 2/2 R hip pain. CT showed R hip acute comminuted intertrochanteric fx of proximal R femur. Pt underwent IM fixation on 02/17/22. PMH: hypothyroidism, HTN    PT Comments    Pt seen for PT tx with pt agreeable to tx & nurse in room administering pain meds. Pt requires cuing for lateral leans to aide in scooting forward in recliner seat with pt requiring extra time to complete all movements. Pt requires ongoing cuing for hand placement during transfers & is able to transfer STS with min assist. Pt reports need to use restroom but with great difficulty taking a step & with c/o lightheadedness. Provided pt with BSC and assisted pt to toilet. Pt requires significantly extra time on BSC, frequently voiding. Pt ultimately left in care of NT on BSC. Continue to recommend STR upon d/c from acute setting. Will see pt in PM in hopes of progressing ambulation as tolerated.    Recommendations for follow up therapy are one component of a multi-disciplinary discharge planning process, led by the attending physician.  Recommendations may be updated based on patient status, additional functional criteria and insurance authorization.  Follow Up Recommendations  Skilled nursing-short term rehab (<3 hours/day) Can patient physically be transported by private vehicle: No   Assistance Recommended at Discharge Frequent or constant Supervision/Assistance  Patient can return home with the following A lot of help with walking and/or transfers;A lot of help with bathing/dressing/bathroom;Assist for transportation;Assistance with cooking/housework;Help with stairs or ramp for entrance   Equipment Recommendations  Rolling walker (2 wheels)    Recommendations for Other Services       Precautions /  Restrictions Precautions Precautions: Fall Restrictions Weight Bearing Restrictions: Yes RLE Weight Bearing: Weight bearing as tolerated     Mobility  Bed Mobility               General bed mobility comments: pt received in recliner    Transfers Overall transfer level: Needs assistance Equipment used: Rolling walker (2 wheels) Transfers: Sit to/from Stand, Bed to chair/wheelchair/BSC Sit to Stand: Min assist   Step pivot transfers: Min assist       General transfer comment: ongoing cuing re: safe hand placement during STS, cuing for turning to pivot recliner>BSC    Ambulation/Gait                   Stairs             Wheelchair Mobility    Modified Rankin (Stroke Patients Only)       Balance Overall balance assessment: Needs assistance Sitting-balance support: Bilateral upper extremity supported, Feet unsupported Sitting balance-Leahy Scale: Fair Sitting balance - Comments: supervision sitting on elevated BSC   Standing balance support: Reliant on assistive device for balance, Bilateral upper extremity supported, During functional activity Standing balance-Leahy Scale: Fair                              Cognition Arousal/Alertness: Awake/alert Behavior During Therapy: WFL for tasks assessed/performed Overall Cognitive Status: Within Functional Limits for tasks assessed  General Comments: appears to have some memory deficits, question if this is pt's baseline.        Exercises      General Comments General comments (skin integrity, edema, etc.): Pt with ongoing continent void on BSC after some incontinence while standing      Pertinent Vitals/Pain Pain Assessment Pain Assessment: Faces Faces Pain Scale: Hurts little more Pain Location: R hip Pain Descriptors / Indicators: Discomfort, Grimacing Pain Intervention(s): RN gave pain meds during session, Monitored during  session, Limited activity within patient's tolerance, Repositioned    Home Living                          Prior Function            PT Goals (current goals can now be found in the care plan section) Acute Rehab PT Goals Patient Stated Goal: get better, return home PT Goal Formulation: With patient Time For Goal Achievement: 03/04/22 Potential to Achieve Goals: Good Progress towards PT goals: Progressing toward goals    Frequency    BID      PT Plan Current plan remains appropriate    Co-evaluation              AM-PAC PT "6 Clicks" Mobility   Outcome Measure  Help needed turning from your back to your side while in a flat bed without using bedrails?: A Lot Help needed moving from lying on your back to sitting on the side of a flat bed without using bedrails?: A Lot Help needed moving to and from a bed to a chair (including a wheelchair)?: A Little Help needed standing up from a chair using your arms (e.g., wheelchair or bedside chair)?: A Little Help needed to walk in hospital room?: A Lot Help needed climbing 3-5 steps with a railing? : Total 6 Click Score: 13    End of Session Equipment Utilized During Treatment: Gait belt Activity Tolerance: Patient tolerated treatment well Patient left:  (on BSC in care of NT)   PT Visit Diagnosis: Unsteadiness on feet (R26.81);Muscle weakness (generalized) (M62.81);Pain;Difficulty in walking, not elsewhere classified (R26.2) Pain - Right/Left: Right Pain - part of body: Hip     Time: 9024-0973 PT Time Calculation (min) (ACUTE ONLY): 28 min  Charges:  $Therapeutic Activity: 23-37 mins                     Lavone Nian, PT, DPT 02/19/22, 9:20 AM   Waunita Schooner 02/19/2022, 9:18 AM

## 2022-02-19 NOTE — Plan of Care (Signed)
  Problem: Education: Goal: Knowledge of General Education information will improve Description: Including pain rating scale, medication(s)/side effects and non-pharmacologic comfort measures Outcome: Progressing   Problem: Health Behavior/Discharge Planning: Goal: Ability to manage health-related needs will improve Outcome: Progressing   Problem: Clinical Measurements: Goal: Respiratory complications will improve Outcome: Progressing   Problem: Clinical Measurements: Goal: Diagnostic test results will improve Outcome: Progressing   Problem: Clinical Measurements: Goal: Cardiovascular complication will be avoided Outcome: Progressing   Problem: Activity: Goal: Risk for activity intolerance will decrease Outcome: Progressing   Problem: Nutrition: Goal: Adequate nutrition will be maintained Outcome: Progressing   Problem: Coping: Goal: Level of anxiety will decrease Outcome: Progressing   Problem: Elimination: Goal: Will not experience complications related to bowel motility Outcome: Progressing   Problem: Elimination: Goal: Will not experience complications related to urinary retention Outcome: Progressing   Problem: Safety: Goal: Ability to remain free from injury will improve Outcome: Progressing   Problem: Skin Integrity: Goal: Risk for impaired skin integrity will decrease Outcome: Progressing

## 2022-02-19 NOTE — Progress Notes (Signed)
[facility] Progress Note  Patient: Joanne Gomez    ZOX:[id number]  DOA: 02/16/2022    Date of Service: the patient was seen and examined on 02/19/2022  Brief hospital course: 88 year old female with past medical history of hypothyroidism and hypertension presented to the emergency room on 9/22 after she had a mechanical fall and landed on her right hip and was unable to stand without pain.  In the emergency room, CT scan of right hip noted acute comminuted intertrochanteric fracture of the proximal right femur.  Patient admitted to the hospitalist service.  Noted to have some mildly elevated troponins and so cardiology consulted cleared patient for surgery.  Troponin is not felt to be ACS.  Orthopedic surgery took patient for intramedullary fixation on afternoon of 9/23.  Patient evaluated by physical therapy who are recommending skilled nursing, placement pending as of today 02/19/22   Assessment and Plan: Assessment and Plan: * Intertrochanteric fracture of right femur, closed, initial encounter ([place]) Status post internal fixation.  Monitoring acute blood loss anemia.  Will need skilled nursing.  Pain appears to be moderately well controlled.  Acute postoperative anemia due to expected blood loss Hemoglobin down to 9.8 from 11.7 preop.  Continue to monitor.  Transfuse for hemoglobin below 7.0.  Elevated troponin Chest pain-free.  No shortness of breath.  EKG unremarkable.  Peaked at 356.  Since then has been trending downward.  Not felt to be secondary to ACS.  May have been secondary to hypertension and impact of injury.  Essential hypertension Blood pressure initially elevated on admission, likely secondary to pain.  Since then has been stable.  Continue to monitor.  Continue home medications.  Osteoporosis Suspect a contributing factor to patient's fracture.  Continue calcium and vitamin D.  GERD (gastroesophageal reflux disease) Stable Continue  PPI  Hypothyroidism Stable Continue Synthroid  Hyperglycemia No history of diabetes.  Mild.  Will check A1c.       Body mass index is 19.22 kg/m.  Nutrition Problem: Increased nutrient needs Etiology: post-op healing     Consultants: Orthopedic surgery Cardiology  Procedures: Status post intramedullary fixation 9/23  Antimicrobials: Preop Ancef  Code Status: Full code   Subjective: Pt reports feeling tired today, pain is controlled, no CP/SOB.   Objective: Vital signs were reviewed and unremarkable. Vitals:   02/19/22 0438 02/19/22 0824  BP: 115/72 (!) 106/53  Pulse: 80 77  Resp: 16 18  Temp: 97.9 F (36.6 C) 98.2 F (36.8 C)  SpO2: 96% 98%    Intake/Output Summary (Last 24 hours) at 02/19/2022 1349 Last data filed at 02/19/2022 0701 Gross per 24 hour  Intake 1080 ml  Output 750 ml  Net 330 ml   Filed Weights   02/16/22 1540  Weight: 50.8 kg   Body mass index is 19.22 kg/m.  Exam: General: Alert and oriented x3, no acute distress HEENT: Normocephalic, atraumatic, mucous membranes are moist Cardiovascular: Regular rate and rhythm, S1-S2 Respiratory: Clear to auscultation bilaterally Abdomen: Soft, nontender, nondistended, positive bowel sounds Musculoskeletal: No clubbing or cyanosis or edema.   Skin: No skin breaks, tears, lesions Psychiatry: Appropriate, no evidence of psychosis Neurology: No focal deficits  Recent Data Reviewed: Results for orders placed or performed during the hospital encounter of 02/16/22 (from the past 48 hour(s))  Basic metabolic panel     Status: Abnormal   Collection Time: 02/18/22  5:39 AM  Result Value Ref Range   Sodium 138 135 - 145 mmol/L   Potassium 3.8 3.5 -  5.1 mmol/L   Chloride 102 98 - 111 mmol/L   CO2 27 22 - 32 mmol/L   Glucose, Bld 159 (H) 70 - 99 mg/dL    Comment: Glucose reference range applies only to samples taken after fasting for at least 8 hours.   BUN 29 (H) 8 - 23 mg/dL   Creatinine, Ser  0.89 0.44 - 1.00 mg/dL   Calcium 8.7 (L) 8.9 - 10.3 mg/dL   GFR, Estimated >60 >60 mL/min    Comment: (NOTE) Calculated using the CKD-EPI Creatinine Equation (2021)    Anion gap 9 5 - 15    Comment: Performed at [facility], [address] [place], [place] [address]  CBC     Status: Abnormal   Collection Time: 02/18/22  5:39 AM  Result Value Ref Range   WBC 11.7 (H) 4.0 - 10.5 K/uL   RBC 3.41 (L) 3.87 - 5.11 MIL/uL   Hemoglobin 9.8 (L) 12.0 - 15.0 g/dL   HCT 30.5 (L) 36.0 - 46.0 %   MCV 89.4 80.0 - 100.0 fL   MCH 28.7 26.0 - 34.0 pg   MCHC 32.1 30.0 - 36.0 g/dL   RDW 13.7 11.5 - 15.5 %   Platelets 131 (L) 150 - 400 K/uL   nRBC 0.0 0.0 - 0.2 %    Comment: Performed at [facility], [address] [place], [place] [address]  CBC     Status: Abnormal   Collection Time: 02/18/22 12:30 PM  Result Value Ref Range   WBC 13.6 (H) 4.0 - 10.5 K/uL   RBC 3.54 (L) 3.87 - 5.11 MIL/uL   Hemoglobin 10.2 (L) 12.0 - 15.0 g/dL   HCT 31.5 (L) 36.0 - 46.0 %   MCV 89.0 80.0 - 100.0 fL   MCH 28.8 26.0 - 34.0 pg   MCHC 32.4 30.0 - 36.0 g/dL   RDW 13.6 11.5 - 15.5 %   Platelets 154 150 - 400 K/uL   nRBC 0.0 0.0 - 0.2 %    Comment: Performed at [facility], [address] [place], [place] [address]  Hemoglobin A1c     Status: None   Collection Time: 02/18/22 12:30 PM  Result Value Ref Range   Hgb A1c MFr Bld 5.3 4.8 - 5.6 %    Comment: (NOTE) Pre diabetes:          5.7%-6.4%  Diabetes:              >6.4%  Glycemic control for   <7.0% adults with diabetes    Mean Plasma Glucose 105.41 mg/dL    Comment: Performed at [facility], [place] [address] [place], [place] [address]  CBC     Status: Abnormal   Collection Time: 02/19/22  5:30 AM  Result Value Ref Range   WBC 11.5 (H) 4.0 - 10.5 K/uL   RBC 3.14 (L) 3.87 - 5.11 MIL/uL   Hemoglobin 9.2 (L) 12.0 - 15.0 g/dL   HCT 28.2 (L) 36.0 - 46.0 %   MCV 89.8 80.0 - 100.0 fL   MCH 29.3 26.0 - 34.0 pg    MCHC 32.6 30.0 - 36.0 g/dL   RDW 13.6 11.5 - 15.5 %   Platelets 141 (L) 150 - 400 K/uL   nRBC 0.0 0.0 - 0.2 %    Comment: Performed at [facility], [address] [place], [place] [address]  Basic metabolic panel     Status: Abnormal   Collection Time: 02/19/22  5:30 AM  Result Value Ref Range   Sodium 134 (L) 135 - 145 mmol/L   Potassium 4.3 3.5 - 5.1 mmol/L   Chloride 99 98 - 111 mmol/L   CO2 31 22 - 32 mmol/L   Glucose, Bld 114 (H) 70 - 99 mg/dL    Comment: Glucose reference range applies only to samples taken after fasting for at least 8 hours.   BUN 27 (H) 8 - 23 mg/dL   Creatinine, Ser 0.79 0.44 - 1.00 mg/dL   Calcium 8.6 (L) 8.9 - 10.3 mg/dL   GFR, Estimated >60 >60 mL/min    Comment: (NOTE) Calculated using the CKD-EPI Creatinine Equation (2021)    Anion gap 4 (L) 5 - 15    Comment: Performed at [facility], [address] [place], [place] [address]     Disposition:  Status is: Inpatient Remains inpatient appropriate because:  Need short-term skilled nursing    Anticipated discharge date: 9/25 or 26   Family Communication: Several family members at the bedside DVT Prophylaxis: enoxaparin (LOVENOX) injection 40 mg Start: 02/18/22 0800 SCDs Start: 02/17/22 1922 Place TED hose Start: 02/17/22 1922    Author: Emeterio Reeve ,DO 02/19/2022 1:49 PM  To reach On-call, see care teams to locate the attending and reach out via [email or web address]. Between 7PM-7AM, please contact night-coverage If you still have difficulty reaching the attending provider, please page the [facility] (Director on Call) for [facility] on amion for assistance.

## 2022-02-19 NOTE — Progress Notes (Signed)
Physical Therapy Treatment Patient Details Name: Joanne Gomez MRN: [id number] DOB: 08-19-1930 Today's Date: 02/19/2022   History of Present Illness Pt is an 88 y/o F admitted on 02/16/22 after a fall & inabiilty to stand 2/2 R hip pain. CT showed R hip acute comminuted intertrochanteric fx of proximal R femur. Pt underwent IM fixation on 02/17/22. PMH: hypothyroidism, HTN    PT Comments    Pt seen for PT tx with pt's family present but electing to wait in hallway during session. Pt agreeable to tx & reporting need to void. Pt requires ongoing cuing to scoot out to edge of seat & for proper hand placement during STS. Pt requires min assist for STS & min assist to ambulate ~8 ft x 2 with RW to/from [facility]. Pt demonstrates impaired gait pattern (as noted below) & significantly decreased gait speed. Pt with worsening recall during this session compared to the other times this PT has seen her. Continue to recommend STR upon d/c from acute setting.    Recommendations for follow up therapy are one component of a multi-disciplinary discharge planning process, led by the attending physician.  Recommendations may be updated based on patient status, additional functional criteria and insurance authorization.  Follow Up Recommendations  Skilled nursing-short term rehab (<3 hours/day) Can patient physically be transported by private vehicle: No   Assistance Recommended at Discharge Frequent or constant Supervision/Assistance  Patient can return home with the following A lot of help with walking and/or transfers;A lot of help with bathing/dressing/bathroom;Assist for transportation;Assistance with cooking/housework;Help with stairs or ramp for entrance   Equipment Recommendations  Rolling walker (2 wheels)    Recommendations for Other Services       Precautions / Restrictions Precautions Precautions: Fall Restrictions Weight Bearing Restrictions: Yes RLE Weight Bearing: Weight bearing as tolerated      Mobility  Bed Mobility               General bed mobility comments: recieved and left in chair    Transfers Overall transfer level: Needs assistance Equipment used: Rolling walker (2 wheels) Transfers: Sit to/from Stand Sit to Stand: Min assist   Step pivot transfers: Min assist       General transfer comment: significantly extra time to power up to standing & to lower self down to sitting, poor eccentric control when lowering to sit, max cuing for proper/safe hand placement    Ambulation/Gait Ambulation/Gait assistance: Min assist Gait Distance (Feet):  (8 ft + 8 ft) Assistive device: Rolling walker (2 wheels) Gait Pattern/deviations: Decreased step length - left, Decreased step length - right, Decreased dorsiflexion - right, Decreased dorsiflexion - left, Decreased stride length Gait velocity: significantly decreased, pt requires ~5 minutes to ambulate 8 ft BSC>recliner.     General Gait Details: Pt requires cuing for upright posture & forward vs downward gaze, cuing for increased foot clearance & stepping vs sliding R foot to advance it, pt with poor dorsiflexion RLE during swing phase & absent heel strike RLE.   Stairs             Wheelchair Mobility    Modified Rankin (Stroke Patients Only)       Balance Overall balance assessment: Needs assistance Sitting-balance support: Bilateral upper extremity supported, Feet supported, Feet unsupported Sitting balance-Leahy Scale: Fair Sitting balance - Comments: supervision sitting on elevated BSC   Standing balance support: Single extremity supported, During functional activity Standing balance-Leahy Scale: Poor  Cognition Arousal/Alertness: Awake/alert Behavior During Therapy: WFL for tasks assessed/performed Overall Cognitive Status: Within Functional Limits for tasks assessed                                 General Comments: Very poor recall  during this session, pt with poor sustained attention requiring ongoing cuing re: task at hand, pt asking same question back to back during session as she didn't remember it        Exercises      General Comments General comments (skin integrity, edema, etc.): Pt with slight urinary incontinence upon standing, continent void on toilet with extra time.      Pertinent Vitals/Pain Pain Assessment Pain Assessment: Faces Faces Pain Scale: Hurts little more Pain Location: R hip Pain Descriptors / Indicators: Discomfort, Grimacing Pain Intervention(s): Monitored during session, Repositioned    Home Living                          Prior Function            PT Goals (current goals can now be found in the care plan section) Acute Rehab PT Goals Patient Stated Goal: get better, return home PT Goal Formulation: With patient Time For Goal Achievement: 03/04/22 Potential to Achieve Goals: Good Progress towards PT goals: Progressing toward goals    Frequency    BID      PT Plan Current plan remains appropriate    Co-evaluation              AM-PAC PT "6 Clicks" Mobility   Outcome Measure  Help needed turning from your back to your side while in a flat bed without using bedrails?: A Lot Help needed moving from lying on your back to sitting on the side of a flat bed without using bedrails?: A Lot Help needed moving to and from a bed to a chair (including a wheelchair)?: A Little Help needed standing up from a chair using your arms (e.g., wheelchair or bedside chair)?: A Little Help needed to walk in hospital room?: A Little Help needed climbing 3-5 steps with a railing? : Total 6 Click Score: 14    End of Session   Activity Tolerance: Patient tolerated treatment well Patient left: in chair;with chair alarm set;with call bell/phone within reach Nurse Communication: Mobility status PT Visit Diagnosis: Unsteadiness on feet (R26.81);Muscle weakness  (generalized) (M62.81);Pain;Difficulty in walking, not elsewhere classified (R26.2) Pain - Right/Left: Right Pain - part of body: Hip     Time: 2353-6144 PT Time Calculation (min) (ACUTE ONLY): 27 min  Charges:  $Gait Training: 8-22 mins $Therapeutic Activity: 8-22 mins                     Lavone Nian, PT, DPT 02/19/22, 3:20 PM   Waunita Schooner 02/19/2022, 3:18 PM

## 2022-02-19 NOTE — Progress Notes (Signed)
Occupational Therapy Treatment Patient Details Name: Joanne Gomez MRN: [id number] DOB: [birth year] Today's Date: 02/19/2022   History of present illness Pt is an 88 y/o F admitted on 02/16/22 after a fall & inabiilty to stand 2/2 R hip pain. CT showed R hip acute comminuted intertrochanteric fx of proximal R femur. Pt underwent IM fixation on 02/17/22. PMH: hypothyroidism, HTN   OT comments  Joanne Gomez was seen for OT treatment on this date. Upon arrival to room pt reclined in chair, reports need to use toilet and agreeable to tx. Pt requires MOD A + RW for BSC t/f, assist to weight shift and advance LLE. MIN A pericare in standing. MAX A don underwear. Pt requires cues and redirection to task t/o however very pleasant. Pt making good progress toward goals, will continue to follow POC. Discharge recommendation remains appropriate.     Recommendations for follow up therapy are one component of a multi-disciplinary discharge planning process, led by the attending physician.  Recommendations may be updated based on patient status, additional functional criteria and insurance authorization.    Follow Up Recommendations  Skilled nursing-short term rehab (<3 hours/day)    Assistance Recommended at Discharge Frequent or constant Supervision/Assistance  Patient can return home with the following  A lot of help with walking and/or transfers;A lot of help with bathing/dressing/bathroom   Equipment Recommendations  Other (comment) (defer)    Recommendations for Other Services      Precautions / Restrictions Precautions Precautions: Fall Restrictions Weight Bearing Restrictions: Yes RLE Weight Bearing: Weight bearing as tolerated       Mobility Bed Mobility               General bed mobility comments: recieved and left in chair    Transfers Overall transfer level: Needs assistance Equipment used: Rolling walker (2 wheels) Transfers: Sit to/from Stand, Bed to  chair/wheelchair/BSC Sit to Stand: Min assist     Step pivot transfers: Mod assist     General transfer comment: assist to weight shift and initiate LLE movement     Balance Overall balance assessment: Needs assistance Sitting-balance support: Bilateral upper extremity supported, Feet unsupported Sitting balance-Leahy Scale: Fair     Standing balance support: Single extremity supported, During functional activity Standing balance-Leahy Scale: Poor                             ADL either performed or assessed with clinical judgement   ADL Overall ADL's : Needs assistance/impaired                                       General ADL Comments: MOD A + RW for BSC t/f, MIN A pericare in standing. MAX A don underwear.      Cognition Arousal/Alertness: Awake/alert Behavior During Therapy: WFL for tasks assessed/performed Overall Cognitive Status: Within Functional Limits for tasks assessed                                 General Comments: appears to have some memory deficits, question if this is pt's baseline.                   Pertinent Vitals/ Pain       Pain Assessment Pain Assessment: Faces Faces Pain Scale: Hurts little more  Pain Location: R hip Pain Descriptors / Indicators: Discomfort, Grimacing Pain Intervention(s): Limited activity within patient's tolerance, Repositioned   Frequency  Min 2X/week        Progress Toward Goals  OT Goals(current goals can now be found in the care plan section)  Progress towards OT goals: Progressing toward goals  Acute Rehab OT Goals Patient Stated Goal: to go home OT Goal Formulation: With patient/family Time For Goal Achievement: 03/04/22 Potential to Achieve Goals: Good  Plan Discharge plan remains appropriate;Frequency remains appropriate    Co-evaluation                 AM-PAC OT "6 Clicks" Daily Activity     Outcome Measure   Help from another person eating  meals?: None Help from another person taking care of personal grooming?: A Little Help from another person toileting, which includes using toliet, bedpan, or urinal?: A Little Help from another person bathing (including washing, rinsing, drying)?: A Lot Help from another person to put on and taking off regular upper body clothing?: A Little Help from another person to put on and taking off regular lower body clothing?: A Lot 6 Click Score: 17    End of Session    OT Visit Diagnosis: History of falling (Z91.81);Unsteadiness on feet (R26.81)   Activity Tolerance Patient tolerated treatment well   Patient Left in chair;with call bell/phone within reach;with family/visitor present   Nurse Communication Mobility status        Time: 8677-3736 OT Time Calculation (min): 28 min  Charges: OT General Charges $OT Visit: 1 Visit OT Treatments $Self Care/Home Management : 23-37 mins  Joanne Gomez, M.S. OTR/L  02/19/22, 1:45 PM  ascom [phone]

## 2022-02-19 NOTE — Progress Notes (Addendum)
  Subjective:  POD #2 s/p intramedullary fixation of right intertrochanteric hip fracture.   Patient reports right hip pain as mild to moderate.  Patient is sitting up in a chair.  He has no other complaints.  Objective:   VITALS:   Vitals:   02/19/22 0057 02/19/22 0438 02/19/22 0824 02/19/22 1530  BP: (!) 119/57 115/72 (!) 106/53 124/64  Pulse: 86 80 77 81  Resp: '19 16 18 18  '$ Temp: 98.6 F (37 C) 97.9 F (36.6 C) 98.2 F (36.8 C) 97.9 F (36.6 C)  TempSrc:      SpO2: 95% 96% 98% 98%  Weight:      Height:        PHYSICAL EXAM: Right lower extremity Neurovascular intact Sensation intact distally Intact pulses distally Dorsiflexion/Plantar flexion intact Incision: dressing C/D/I No cellulitis present Compartment soft  LABS  Results for orders placed or performed during the hospital encounter of 02/16/22 (from the past 24 hour(s))  CBC     Status: Abnormal   Collection Time: 02/19/22  5:30 AM  Result Value Ref Range   WBC 11.5 (H) 4.0 - 10.5 K/uL   RBC 3.14 (L) 3.87 - 5.11 MIL/uL   Hemoglobin 9.2 (L) 12.0 - 15.0 g/dL   HCT 28.2 (L) 36.0 - 46.0 %   MCV 89.8 80.0 - 100.0 fL   MCH 29.3 26.0 - 34.0 pg   MCHC 32.6 30.0 - 36.0 g/dL   RDW 13.6 11.5 - 15.5 %   Platelets 141 (L) 150 - 400 K/uL   nRBC 0.0 0.0 - 0.2 %  Basic metabolic panel     Status: Abnormal   Collection Time: 02/19/22  5:30 AM  Result Value Ref Range   Sodium 134 (L) 135 - 145 mmol/L   Potassium 4.3 3.5 - 5.1 mmol/L   Chloride 99 98 - 111 mmol/L   CO2 31 22 - 32 mmol/L   Glucose, Bld 114 (H) 70 - 99 mg/dL   BUN 27 (H) 8 - 23 mg/dL   Creatinine, Ser 0.79 0.44 - 1.00 mg/dL   Calcium 8.6 (L) 8.9 - 10.3 mg/dL   GFR, Estimated >60 >60 mL/min   Anion gap 4 (L) 5 - 15    No results found.  Assessment/Plan: 2 Days Post-Op   Principal Problem:   Intertrochanteric fracture of right femur, closed, initial encounter ([place]) Active Problems:   Hypothyroidism   Osteoporosis   GERD (gastroesophageal  reflux disease)   Essential hypertension   Elevated troponin   Acute postoperative anemia due to expected blood loss   Hyperglycemia  Patient is doing well from an orthopedic standpoint.  Is making good progress with physical therapy.  Patient will need a skilled nursing facility upon discharge.  Continue PT and OT.  Globin 9.8 today.  No need for a transfusion.  Lovenox started today for DVT prophylaxis.    Thornton Park , MD 02/19/2022, 8:07 PM

## 2022-02-19 NOTE — Plan of Care (Signed)

## 2022-02-20 DIAGNOSIS — K2100 Gastro-esophageal reflux disease with esophagitis, without bleeding: Secondary | ICD-10-CM | POA: Diagnosis not present

## 2022-02-20 DIAGNOSIS — I10 Essential (primary) hypertension: Secondary | ICD-10-CM | POA: Diagnosis not present

## 2022-02-20 DIAGNOSIS — M79661 Pain in right lower leg: Secondary | ICD-10-CM | POA: Diagnosis not present

## 2022-02-20 DIAGNOSIS — W1830XA Fall on same level, unspecified, initial encounter: Secondary | ICD-10-CM | POA: Diagnosis not present

## 2022-02-20 DIAGNOSIS — E785 Hyperlipidemia, unspecified: Secondary | ICD-10-CM | POA: Diagnosis not present

## 2022-02-20 DIAGNOSIS — D649 Anemia, unspecified: Secondary | ICD-10-CM | POA: Diagnosis not present

## 2022-02-20 DIAGNOSIS — D500 Iron deficiency anemia secondary to blood loss (chronic): Secondary | ICD-10-CM | POA: Diagnosis not present

## 2022-02-20 DIAGNOSIS — I82401 Acute embolism and thrombosis of unspecified deep veins of right lower extremity: Secondary | ICD-10-CM | POA: Diagnosis not present

## 2022-02-20 DIAGNOSIS — S72144D Nondisplaced intertrochanteric fracture of right femur, subsequent encounter for closed fracture with routine healing: Secondary | ICD-10-CM | POA: Diagnosis not present

## 2022-02-20 DIAGNOSIS — Z23 Encounter for immunization: Secondary | ICD-10-CM | POA: Diagnosis not present

## 2022-02-20 DIAGNOSIS — K219 Gastro-esophageal reflux disease without esophagitis: Secondary | ICD-10-CM | POA: Diagnosis not present

## 2022-02-20 DIAGNOSIS — R278 Other lack of coordination: Secondary | ICD-10-CM | POA: Diagnosis not present

## 2022-02-20 DIAGNOSIS — S72141A Displaced intertrochanteric fracture of right femur, initial encounter for closed fracture: Secondary | ICD-10-CM | POA: Diagnosis not present

## 2022-02-20 DIAGNOSIS — I82491 Acute embolism and thrombosis of other specified deep vein of right lower extremity: Secondary | ICD-10-CM | POA: Diagnosis not present

## 2022-02-20 DIAGNOSIS — K5730 Diverticulosis of large intestine without perforation or abscess without bleeding: Secondary | ICD-10-CM | POA: Diagnosis not present

## 2022-02-20 DIAGNOSIS — S72001A Fracture of unspecified part of neck of right femur, initial encounter for closed fracture: Secondary | ICD-10-CM | POA: Diagnosis not present

## 2022-02-20 DIAGNOSIS — R296 Repeated falls: Secondary | ICD-10-CM | POA: Diagnosis not present

## 2022-02-20 DIAGNOSIS — R2689 Other abnormalities of gait and mobility: Secondary | ICD-10-CM | POA: Diagnosis not present

## 2022-02-20 DIAGNOSIS — Z85038 Personal history of other malignant neoplasm of large intestine: Secondary | ICD-10-CM | POA: Diagnosis not present

## 2022-02-20 DIAGNOSIS — S72141D Displaced intertrochanteric fracture of right femur, subsequent encounter for closed fracture with routine healing: Secondary | ICD-10-CM | POA: Diagnosis not present

## 2022-02-20 DIAGNOSIS — M6281 Muscle weakness (generalized): Secondary | ICD-10-CM | POA: Diagnosis not present

## 2022-02-20 DIAGNOSIS — Y92009 Unspecified place in unspecified non-institutional (private) residence as the place of occurrence of the external cause: Secondary | ICD-10-CM | POA: Diagnosis not present

## 2022-02-20 DIAGNOSIS — S72144A Nondisplaced intertrochanteric fracture of right femur, initial encounter for closed fracture: Secondary | ICD-10-CM | POA: Diagnosis not present

## 2022-02-20 DIAGNOSIS — E038 Other specified hypothyroidism: Secondary | ICD-10-CM | POA: Diagnosis not present

## 2022-02-20 DIAGNOSIS — M8000XG Age-related osteoporosis with current pathological fracture, unspecified site, subsequent encounter for fracture with delayed healing: Secondary | ICD-10-CM | POA: Diagnosis not present

## 2022-02-20 DIAGNOSIS — M810 Age-related osteoporosis without current pathological fracture: Secondary | ICD-10-CM | POA: Diagnosis not present

## 2022-02-20 DIAGNOSIS — E039 Hypothyroidism, unspecified: Secondary | ICD-10-CM | POA: Diagnosis not present

## 2022-02-20 DIAGNOSIS — D62 Acute posthemorrhagic anemia: Secondary | ICD-10-CM | POA: Diagnosis not present

## 2022-02-20 LAB — CBC
HCT: 27.9 % — ABNORMAL LOW (ref 36.0–46.0)
Hemoglobin: 8.8 g/dL — ABNORMAL LOW (ref 12.0–15.0)
MCH: 28.1 pg (ref 26.0–34.0)
MCHC: 31.5 g/dL (ref 30.0–36.0)
MCV: 89.1 fL (ref 80.0–100.0)
Platelets: 157 10*3/uL (ref 150–400)
RBC: 3.13 MIL/uL — ABNORMAL LOW (ref 3.87–5.11)
RDW: 13.5 % (ref 11.5–15.5)
WBC: 9.2 10*3/uL (ref 4.0–10.5)
nRBC: 0 % (ref 0.0–0.2)

## 2022-02-20 MED ORDER — ENOXAPARIN SODIUM 40 MG/0.4ML IJ SOSY: 40.0000 mg | PREFILLED_SYRINGE | INTRAMUSCULAR | 0 refills | Status: AC

## 2022-02-20 MED ORDER — POLYETHYLENE GLYCOL 3350 17 G PO PACK: 17.0000 g | PACK | Freq: Every day | ORAL | 0 refills | Status: AC | PRN

## 2022-02-20 MED ORDER — ALUM & MAG HYDROXIDE-SIMETH 200-200-20 MG/5ML PO SUSP: 30.0000 mL | ORAL | 0 refills | Status: AC | PRN

## 2022-02-20 MED ORDER — BISACODYL 10 MG RE SUPP: 10.0000 mg | Freq: Every day | RECTAL | 0 refills | Status: AC | PRN

## 2022-02-20 MED ORDER — ASPIRIN 81 MG PO TBEC: 81.0000 mg | DELAYED_RELEASE_TABLET | Freq: Every day | ORAL | 0 refills | Status: AC

## 2022-02-20 MED ORDER — METOPROLOL TARTRATE 25 MG PO TABS: 12.5000 mg | ORAL_TABLET | Freq: Two times a day (BID) | ORAL | 0 refills | Status: AC

## 2022-02-20 MED ORDER — DOCUSATE SODIUM 100 MG PO CAPS: 100.0000 mg | ORAL_CAPSULE | Freq: Two times a day (BID) | ORAL | 0 refills | Status: AC

## 2022-02-20 MED ORDER — HYDROCODONE-ACETAMINOPHEN 5-325 MG PO TABS: 1.0000 | ORAL_TABLET | Freq: Four times a day (QID) | ORAL | 0 refills | Status: AC | PRN

## 2022-02-20 MED ORDER — SENNA 8.6 MG PO TABS: 1.0000 | ORAL_TABLET | Freq: Two times a day (BID) | ORAL | 0 refills | Status: AC

## 2022-02-20 MED ORDER — ONDANSETRON HCL 4 MG PO TABS: 4.0000 mg | ORAL_TABLET | Freq: Four times a day (QID) | ORAL | 0 refills | Status: AC | PRN

## 2022-02-20 MED ORDER — METHOCARBAMOL 500 MG PO TABS: 500.0000 mg | ORAL_TABLET | Freq: Four times a day (QID) | ORAL | 0 refills | Status: AC | PRN

## 2022-02-20 NOTE — Plan of Care (Signed)

## 2022-02-20 NOTE — Progress Notes (Signed)
[facility] Dunn placed was called and report was given to nurse Vicente Males, waiting on transport by EMS.

## 2022-02-20 NOTE — Progress Notes (Signed)
Physical Therapy Treatment Patient Details Name: Joanne Gomez MRN: [id number] DOB: April 15, 1931 Today's Date: 02/20/2022   History of Present Illness Pt is an 88 y/o F admitted on 02/16/22 after a fall & inabiilty to stand 2/2 R hip pain. CT showed R hip acute comminuted intertrochanteric fx of proximal R femur. Pt underwent IM fixation on 02/17/22. PMH: hypothyroidism, HTN    PT Comments    Pt increased gait distance to 83f with CGA and support of RW. Increased time to complete, pt moves "very" slowly. Good mobility progression noted, 6/10 R hip pain but able to work through it. Anticipate only a short stay at SNF prior to returning home.   Recommendations for follow up therapy are one component of a multi-disciplinary discharge planning process, led by the attending physician.  Recommendations may be updated based on patient status, additional functional criteria and insurance authorization.  Follow Up Recommendations  Skilled nursing-short term rehab (<3 hours/day) Can patient physically be transported by private vehicle: No   Assistance Recommended at Discharge Frequent or constant Supervision/Assistance  Patient can return home with the following A lot of help with walking and/or transfers;A lot of help with bathing/dressing/bathroom;Assist for transportation;Assistance with cooking/housework;Help with stairs or ramp for entrance   Equipment Recommendations  Rolling walker (2 wheels)    Recommendations for Other Services       Precautions / Restrictions Precautions Precautions: Fall Restrictions Weight Bearing Restrictions: Yes RLE Weight Bearing: Weight bearing as tolerated     Mobility  Bed Mobility                    Transfers Overall transfer level: Needs assistance Equipment used: Rolling walker (2 wheels) Transfers: Sit to/from Stand Sit to Stand: Min assist           General transfer comment: significantly extra time to power up to standing & to  lower self down to sitting, poor eccentric control when lowering to sit, max cuing for proper/safe hand placement    Ambulation/Gait Ambulation/Gait assistance: Min guard Gait Distance (Feet): 25 Feet Assistive device: Rolling walker (2 wheels) Gait Pattern/deviations: Decreased step length - left, Decreased step length - right, Decreased dorsiflexion - right, Decreased dorsiflexion - left, Decreased stride length Gait velocity: significantly decreased     General Gait Details: 25 minutes to ambulate 25 feet   Stairs             Wheelchair Mobility    Modified Rankin (Stroke Patients Only)       Balance Overall balance assessment: Needs assistance Sitting-balance support: Bilateral upper extremity supported, Feet supported, Feet unsupported Sitting balance-Leahy Scale: Fair Sitting balance - Comments: supervision sitting on elevated BSC   Standing balance support: Single extremity supported, During functional activity Standing balance-Leahy Scale: Poor                              Cognition Arousal/Alertness: Awake/alert Behavior During Therapy: WFL for tasks assessed/performed Overall Cognitive Status: Within Functional Limits for tasks assessed                                          Exercises General Exercises - Lower Extremity Ankle Circles/Pumps: AROM, Both, Supine, 10 reps Long Arc Quad: AROM, Strengthening, Right, 10 reps, Seated Hip ABduction/ADduction: AAROM, Strengthening, Right, 10 reps, Supine    General  Comments        Pertinent Vitals/Pain Pain Assessment Pain Assessment: 0-10 Pain Score: 6  Pain Location: R hip Pain Descriptors / Indicators: Discomfort, Grimacing Pain Intervention(s): Monitored during session, Premedicated before session    Home Living                          Prior Function            PT Goals (current goals can now be found in the care plan section) Acute Rehab PT  Goals Patient Stated Goal: get better, return home Progress towards PT goals: Progressing toward goals    Frequency    BID      PT Plan Current plan remains appropriate    Co-evaluation              AM-PAC PT "6 Clicks" Mobility   Outcome Measure  Help needed turning from your back to your side while in a flat bed without using bedrails?: A Lot Help needed moving from lying on your back to sitting on the side of a flat bed without using bedrails?: A Lot Help needed moving to and from a bed to a chair (including a wheelchair)?: A Little Help needed standing up from a chair using your arms (e.g., wheelchair or bedside chair)?: A Little Help needed to walk in hospital room?: A Little Help needed climbing 3-5 steps with a railing? : Total 6 Click Score: 14    End of Session Equipment Utilized During Treatment: Gait belt Activity Tolerance: Patient tolerated treatment well Patient left: in chair;with chair alarm set;with call bell/phone within reach Nurse Communication: Mobility status PT Visit Diagnosis: Unsteadiness on feet (R26.81);Muscle weakness (generalized) (M62.81);Pain;Difficulty in walking, not elsewhere classified (R26.2) Pain - Right/Left: Right Pain - part of body: Hip     Time: 1100-1146 PT Time Calculation (min) (ACUTE ONLY): 46 min  Charges:  $Gait Training: 23-37 mins $Therapeutic Exercise: 8-22 mins                    Mikel Cella, PTA    [name] 02/20/2022, 12:52 PM

## 2022-02-20 NOTE — TOC Progression Note (Signed)
Transition of Care [facility]) - Progression Note    Patient Details  Name: Joanne Gomez MRN: [id number] Date of Birth: [birth year]  Transition [facility]) CM/SW [place], RN Phone Number: 02/20/2022, 9:20 AM  Clinical Narrative:     Had conversation with the patient and her daughters in the room, reviewed the bed offers, they chose [facility] and rehab,   Ins approved for SNF 9/26 - 9/28, review due 9/28.  Reference ID:  [id number].  Plan Auth: [id number].      Expected Discharge Plan and Services                                                 Social Determinants of Health (SDOH) Interventions    Readmission Risk Interventions     No data to display

## 2022-02-20 NOTE — Plan of Care (Signed)
  Problem: Education: Goal: Knowledge of General Education information will improve Description: Including pain rating scale, medication(s)/side effects and non-pharmacologic comfort measures Outcome: Progressing   Problem: Health Behavior/Discharge Planning: Goal: Ability to manage health-related needs will improve Outcome: Progressing   Problem: Clinical Measurements: Goal: Ability to maintain clinical measurements within normal limits will improve Outcome: Progressing   Problem: Activity: Goal: Risk for activity intolerance will decrease Outcome: Progressing   Problem: Nutrition: Goal: Adequate nutrition will be maintained Outcome: Progressing   Problem: Coping: Goal: Level of anxiety will decrease Outcome: Progressing   Problem: Elimination: Goal: Will not experience complications related to urinary retention Outcome: Progressing   Problem: Pain Managment: Goal: General experience of comfort will improve Outcome: Progressing   Problem: Safety: Goal: Ability to remain free from injury will improve Outcome: Progressing   Problem: Activity: Goal: Ability to ambulate and perform ADLs will improve Outcome: Progressing

## 2022-02-20 NOTE — TOC Progression Note (Signed)
Transition of Care [facility]) - Progression Note    Patient Details  Name: Joanne Gomez MRN: [id number] Date of Birth: 1931/01/28  Transition of Care [facility]) CM/SW [place], RN Phone Number: 02/20/2022, 11:10 AM  Clinical Narrative:    Patient going to [facility] room number 208P EMS called there are 3 ahead of her  Family is aware        Expected Discharge Plan and Services           Expected Discharge Date: 02/20/22                                     Social Determinants of Health (SDOH) Interventions    Readmission Risk Interventions     No data to display

## 2022-02-20 NOTE — Discharge Summary (Signed)
Physician Discharge Summary   Patient: Joanne Gomez MRN: [id number]  DOB: [birth year]   Admit:     Date of Admission: 02/16/2022 Admitted from: home   Discharge: Date of discharge: 02/20/22 Disposition: Skilled nursing facility Condition at discharge: fair  CODE STATUS: DNR - form signed and on chart      Discharge Physician: Emeterio Reeve, DO [facility]     PCP: Susy Frizzle, MD  Recommendations for Outpatient Follow-up:  Follow up with PCP Susy Frizzle, MD in 1-2 weeks Please obtain labs/tests: CBC, BMP in 1-2 weeks  Please follow up on the following pending results: none PCP AND OTHER OUTPATIENT PROVIDERS: SEE BELOW FOR SPECIFIC DISCHARGE INSTRUCTIONS PRINTED FOR PATIENT IN ADDITION TO GENERIC AVS PATIENT INFO  Monitor BP and surgical site    Discharge Instructions     Diet - low sodium heart healthy   Complete by: As directed    Discharge instructions   Complete by: As directed    Follow w/ orthopedics in 7-10 days  Vital signes per protocol or blood pressure should be checked daily for first 3 days in facility   Discharge wound care:   Complete by: As directed    Keep surgical site clean dry, dressing change daily / as needed   Increase activity slowly   Complete by: As directed          Discharge Diagnoses: Principal Problem:   Intertrochanteric fracture of right femur, closed, initial encounter ([place]) Active Problems:   Acute postoperative anemia due to expected blood loss   Elevated troponin   Essential hypertension   Osteoporosis   GERD (gastroesophageal reflux disease)   Hypothyroidism   Hyperglycemia       Hospital Course: 88 year old female with past medical history of hypothyroidism and hypertension presented to the emergency room on 9/22 after she had a mechanical fall and landed on her right hip and was unable to stand without pain.  In the emergency room, CT scan of right hip noted acute comminuted  intertrochanteric fracture of the proximal right femur.  Patient admitted to the hospitalist service.  Noted to have some mildly elevated troponins and so cardiology consulted cleared patient for surgery.  Troponin is not felt to be ACS.  Orthopedic surgery took patient for intramedullary fixation on afternoon of 9/23.  Patient evaluated by physical therapy who are recommending skilled nursing, placement pending as of today 02/19/22      Consultants:  Orthopedics  Procedures: 02/17/22 Intramedullary fixation for right intertrochanteric hip fracture      ASSESSMENT & PLAN:   Principal Problem:   Intertrochanteric fracture of right femur, closed, initial encounter ([place]) Active Problems:   Acute postoperative anemia due to expected blood loss   Elevated troponin   Essential hypertension   Osteoporosis   GERD (gastroesophageal reflux disease)   Hypothyroidism   Hyperglycemia   Intertrochanteric fracture of right femur, closed, initial encounter ([place]) Status post internal fixation.  Monitoring acute blood loss anemia.  Will need skilled nursing.  Pain appears to be moderately well controlled.  Hypothyroidism Stable Continue home meds on discharge   GERD (gastroesophageal reflux disease) Stable Continue PPI  Essential hypertension Blood pressure initially elevated on admission, likely secondary to pain.  Since then has been stable or low.  Continue to monitor.  Changed home medications. Monitor BP at SNF   Elevated troponin Chest pain-free.  No shortness of breath.  EKG unremarkable.  Peaked at 356.  Since then has  been trending downward.  Not felt to be secondary to ACS.  Likely secondary to hypertension and impact of injury.  Osteoporosis Suspect a contributing factor to patient's fracture.  Continue calcium and vitamin D.  Acute postoperative anemia due to expected blood loss Hemoglobin down to 9.8 from 11.7 preop.  Continue to monitor.  Transfuse for hemoglobin below  7.0. CBC in 1-2 weeks   Hyperglycemia No history of diabetes.  Mild.    DVT prophylaxis: lovenox to continue pending ortho follow up           Discharge Instructions  Allergies as of 02/20/2022   No Known Allergies      Medication List     STOP taking these medications    amLODipine 10 MG tablet Commonly known as: NORVASC   amLODipine 2.5 MG tablet Commonly known as: NORVASC   traMADol 50 MG tablet Commonly known as: ULTRAM       TAKE these medications    alum & mag hydroxide-simeth 200-200-20 MG/5ML suspension Commonly known as: MAALOX/MYLANTA Take 30 mLs by mouth every 4 (four) hours as needed for indigestion.   aspirin EC 81 MG tablet Take 1 tablet (81 mg total) by mouth daily. Swallow whole. Start taking on: February 21, 2022   bisacodyl 10 MG suppository Commonly known as: DULCOLAX Place 1 suppository (10 mg total) rectally daily as needed for moderate constipation.   CALCIUM CITRATE PO Take 1 tablet by mouth 2 (two) times daily. '630mg'$    cyanocobalamin 1000 MCG tablet Commonly known as: VITAMIN B12 Take 5,000 mcg by mouth daily.   docusate sodium 100 MG capsule Commonly known as: COLACE Take 1 capsule (100 mg total) by mouth 2 (two) times daily.   enoxaparin 40 MG/0.4ML injection Commonly known as: LOVENOX Inject 0.4 mLs (40 mg total) into the skin daily for 10 days. Start taking on: February 21, 2022   esomeprazole 40 MG capsule Commonly known as: NEXIUM Take 40 mg by mouth daily as needed.   Fluocinolone Acetonide 0.01 % Oil Place 3 drops in ear(s) 2 (two) times daily as needed.   HYDROcodone-acetaminophen 5-325 MG tablet Commonly known as: NORCO/VICODIN Take 1 tablet by mouth every 6 (six) hours as needed for moderate pain.   Magnesium 250 MG Tabs Take 1 tablet by mouth daily.   methocarbamol 500 MG tablet Commonly known as: ROBAXIN Take 1 tablet (500 mg total) by mouth every 6 (six) hours as needed for muscle spasms.    metoprolol tartrate 25 MG tablet Commonly known as: LOPRESSOR Take 0.5 tablets (12.5 mg total) by mouth 2 (two) times daily.   multivitamin tablet Take 1 tablet by mouth daily.   nystatin ointment Commonly known as: MYCOSTATIN Apply 1 application. topically 2 (two) times daily as needed.   ondansetron 4 MG tablet Commonly known as: ZOFRAN Take 1 tablet (4 mg total) by mouth every 6 (six) hours as needed for nausea.   polyethylene glycol 17 g packet Commonly known as: MIRALAX / GLYCOLAX Take 17 g by mouth daily as needed for mild constipation.   senna 8.6 MG Tabs tablet Commonly known as: SENOKOT Take 1 tablet (8.6 mg total) by mouth 2 (two) times daily. While on opiate pain medications   thyroid 60 MG tablet Commonly known as: Armour Thyroid TAKE 1 & 1/2 TABLETS FOUR TIMES A WEEK AND TAKE 1 TABLET THREE TIMES A WEEK   triamcinolone 55 MCG/ACT Aero nasal inhaler Commonly known as: NASACORT 2 sprays 2 (two) times daily.   Vitamin  D3 50 MCG (2000 UT) Tabs Take 1 tablet by mouth daily.   vitamin E 45 MG (100 UNITS) capsule Take 200 Units by mouth daily.   Xiidra 5 % Soln Generic drug: Lifitegrast Xiidra 5 % eye drops in a dropperette               Discharge Care Instructions  (From admission, onward)           Start     Ordered   02/20/22 0000  Discharge wound care:       Comments: Keep surgical site clean dry, dressing change daily / as needed   02/20/22 1049             Contact information for after-discharge care     Destination     [facility]erred [facility]F .   Service: Skilled Nursing Contact information: [address] [place] [phone]                     No Known Allergies   Subjective: pain controlled today, no CP/SOB, no additinoal qquestions    Discharge Exam: BP 117/64 (BP Location: Left Arm)   Pulse 86   Temp 98.2 F (36.8 C)   Resp 17   Ht '5\' 4"'$  (1.626 m)   Wt  50.8 kg   SpO2 97%   BMI 19.22 kg/m  General: Pt is alert, awake, not in acute distress Cardiovascular: RRR, S1/S2 +, no rubs, no gallops Respiratory: CTA bilaterally, no wheezing, no rhonchi Abdominal: Soft, NT, ND, bowel sounds + Extremities: no edema, no cyanosis     The results of significant diagnostics from this hospitalization (including imaging, microbiology, ancillary and laboratory) are listed below for reference.     Microbiology: Recent Results (from the past 240 hour(s))  Surgical PCR screen     Status: None   Collection Time: 02/17/22 12:27 AM   Specimen: Nasal Mucosa; Nasal Swab  Result Value Ref Range Status   MRSA, PCR NEGATIVE NEGATIVE Final   Staphylococcus aureus NEGATIVE NEGATIVE Final    Comment: (NOTE) The Xpert SA Assay (FDA approved for NASAL specimens in patients 52 years of age and older), is one component of a comprehensive surveillance program. It is not intended to diagnose infection nor to guide or monitor treatment. Performed at [facility], [address] [place], [place] [address]      Labs: BNP (last 3 results) No results for input(s): "BNP" in the last 8760 hours. Basic Metabolic Panel: Recent Labs  Lab 02/16/22 1721 02/17/22 0148 02/18/22 0539 02/19/22 0530  NA 141 141 138 134*  K 3.7 4.2 3.8 4.3  CL 101 105 102 99  CO2 '29 29 27 31  '$ GLUCOSE 122* 136* 159* 114*  BUN 15 16 29* 27*  CREATININE 0.65 0.71 0.89 0.79  CALCIUM 9.6 9.5 8.7* 8.6*   Liver Function Tests: Recent Labs  Lab 02/16/22 1721  AST 26  ALT 17  ALKPHOS 60  BILITOT 0.7  PROT 8.2*  ALBUMIN 4.5   No results for input(s): "LIPASE", "AMYLASE" in the last 168 hours. No results for input(s): "AMMONIA" in the last 168 hours. CBC: Recent Labs  Lab 02/16/22 1721 02/17/22 0148 02/18/22 0539 02/18/22 1230 02/19/22 0530 02/20/22 0422  WBC 13.2* 10.4 11.7* 13.6* 11.5* 9.2  NEUTROABS 11.5*  --   --   --   --   --   HGB 13.7 11.7* 9.8* 10.2*  9.2* 8.8*  HCT  42.8 36.7 30.5* 31.5* 28.2* 27.9*  MCV 89.5 89.5 89.4 89.0 89.8 89.1  PLT 223 169 131* 154 141* 157   Cardiac Enzymes: No results for input(s): "CKTOTAL", "CKMB", "CKMBINDEX", "TROPONINI" in the last 168 hours. BNP: Invalid input(s): "POCBNP" CBG: No results for input(s): "GLUCAP" in the last 168 hours. D-Dimer No results for input(s): "DDIMER" in the last 72 hours. Hgb A1c Recent Labs    02/18/22 1230  HGBA1C 5.3   Lipid Profile No results for input(s): "CHOL", "HDL", "LDLCALC", "TRIG", "CHOLHDL", "LDLDIRECT" in the last 72 hours. Thyroid function studies No results for input(s): "TSH", "T4TOTAL", "T3FREE", "THYROIDAB" in the last 72 hours.  Invalid input(s): "FREET3" Anemia work up No results for input(s): "VITAMINB12", "FOLATE", "FERRITIN", "TIBC", "IRON", "RETICCTPCT" in the last 72 hours. Urinalysis    Component Value Date/Time   COLORURINE YELLOW 01/16/2021 1128   APPEARANCEUR CLEAR 01/16/2021 1128   LABSPEC 1.007 01/16/2021 1128   PHURINE 7.5 01/16/2021 1128   GLUCOSEU NEGATIVE 01/16/2021 1128   HGBUR NEGATIVE 01/16/2021 1128   BILIRUBINUR NEGATIVE 03/13/2016 1615   KETONESUR NEGATIVE 01/16/2021 1128   PROTEINUR NEGATIVE 01/16/2021 1128   UROBILINOGEN 0.2 10/05/2014 1510   NITRITE NEGATIVE 01/16/2021 1128   LEUKOCYTESUR TRACE (A) 01/16/2021 1128   Sepsis Labs Recent Labs  Lab 02/18/22 0539 02/18/22 1230 02/19/22 0530 02/20/22 0422  WBC 11.7* 13.6* 11.5* 9.2   Microbiology Recent Results (from the past 240 hour(s))  Surgical PCR screen     Status: None   Collection Time: 02/17/22 12:27 AM   Specimen: Nasal Mucosa; Nasal Swab  Result Value Ref Range Status   MRSA, PCR NEGATIVE NEGATIVE Final   Staphylococcus aureus NEGATIVE NEGATIVE Final    Comment: (NOTE) The Xpert SA Assay (FDA approved for NASAL specimens in patients 69 years of age and older), is one component of a comprehensive surveillance program. It is not intended to  diagnose infection nor to guide or monitor treatment. Performed at [facility], [address] [place], [place] [address]    Imaging DG Hip Port Unilat With Pelvis 1V Right  Result Date: 02/17/2022 CLINICAL DATA:  Hip fracture status post intramedullary nail EXAM: DG HIP (WITH OR WITHOUT PELVIS) 1V PORT RIGHT COMPARISON:  Right hip x-ray 02/16/2022 FINDINGS: There is a new short stem right femoral intramedullary nail and hip screw fixating intratrochanteric fracture. Alignment is anatomic. There is lateral soft tissue swelling, air and skin staples compatible with recent surgery. Joint spaces are well maintained. IMPRESSION: ORIF right hip intratrochanteric fracture.  Alignment is anatomic. Electronically Signed   By: Ronney Asters M.D.   On: 02/17/2022 19:15   DG HIP UNILAT WITH PELVIS 2-3 VIEWS RIGHT  Result Date: 02/17/2022 CLINICAL DATA:  Surgery EXAM: DG HIP (WITH OR WITHOUT PELVIS) 2-3V RIGHT COMPARISON:  Right hip x-ray 02/16/2022 FINDINGS: Intraoperative right hip. Four low resolution intraoperative spot views of the right hip were obtained. There is a new right femoral intramedullary nail and hip screw present fixating proximal right femoral fracture. Alignment is anatomic. Total fluoroscopy time: 1 minute 14 seconds Total radiation dose: Not recorded IMPRESSION: ORIF right femoral intratrochanteric fracture. Electronically Signed   By: Ronney Asters M.D.   On: 02/17/2022 19:10   DG C-Arm 1-60 Min-No Report  Result Date: 02/17/2022 Fluoroscopy was utilized by the requesting physician.  No radiographic interpretation.   ECHOCARDIOGRAM COMPLETE  Result Date: 02/17/2022    ECHOCARDIOGRAM REPORT   Patient Name:   LAURINE KUYPER Date of Exam: 02/17/2022 Medical  Rec #:  [id number]       Height:       64.0 in Accession #:    [id number]      Weight:       112.0 lb Date of Birth:  1930/10/13       BSA:          1.529 m Patient Age:    88 years        BP:           121/62 mmHg Patient  Gender: F               HR:           71 bpm. Exam Location:  [facility] Procedure: 2D Echo Indications:     Elevated Troponin  History:         Patient has prior history of Echocardiogram examinations, most                  recent 10/30/2019.  Sonographer:     [name] Referring Phys:  [place] Diagnosing Phys: Serafina Royals MD IMPRESSIONS  1. Left ventricular ejection fraction, by estimation, is 60 to 65%. The left ventricle has normal function. The left ventricle has no regional wall motion abnormalities. Left ventricular diastolic parameters were normal.  2. Right ventricular systolic function is normal. The right ventricular size is normal.  3. The mitral valve is normal in structure. Trivial mitral valve regurgitation.  4. The aortic valve is normal in structure. Aortic valve regurgitation is trivial. FINDINGS  Left Ventricle: Left ventricular ejection fraction, by estimation, is 60 to 65%. The left ventricle has normal function. The left ventricle has no regional wall motion abnormalities. The left ventricular internal cavity size was normal in size. There is  no left ventricular hypertrophy. Left ventricular diastolic parameters were normal. Right Ventricle: The right ventricular size is normal. No increase in right ventricular wall thickness. Right ventricular systolic function is normal. Left Atrium: Left atrial size was normal in size. Right Atrium: Right atrial size was normal in size. Pericardium: There is no evidence of pericardial effusion. Mitral Valve: The mitral valve is normal in structure. Trivial mitral valve regurgitation. Tricuspid Valve: The tricuspid valve is normal in structure. Tricuspid valve regurgitation is trivial. Aortic Valve: The aortic valve is normal in structure. Aortic valve regurgitation is trivial. Aortic valve peak gradient measures 8.1 mmHg. Pulmonic Valve: The pulmonic valve was normal in structure. Pulmonic valve regurgitation is trivial. Aorta: The  aortic root and ascending aorta are structurally normal, with no evidence of dilitation. IAS/Shunts: No atrial level shunt detected by color flow Doppler.  LEFT VENTRICLE PLAX 2D LVIDd:         2.40 cm     Diastology LVIDs:         1.70 cm     LV e' medial:    7.51 cm/s LV PW:         1.20 cm     LV E/e' medial:  13.3 LV IVS:        1.30 cm     LV e' lateral:   7.67 cm/s LVOT diam:     1.70 cm     LV E/e' lateral: 13.0 LV SV:         45 LV SV Index:   29 LVOT Area:     2.27 cm  LV Volumes (MOD) LV vol d, MOD A2C: 24.0 ml LV vol d, MOD A4C: 29.5 ml LV  vol s, MOD A2C: 10.3 ml LV vol s, MOD A4C: 10.0 ml LV SV MOD A2C:     13.7 ml LV SV MOD A4C:     29.5 ml LV SV MOD BP:      16.3 ml RIGHT VENTRICLE RV Basal diam:  3.00 cm RV S prime:     13.75 cm/s TAPSE (M-mode): 2.2 cm LEFT ATRIUM             Index        RIGHT ATRIUM          Index LA diam:        2.30 cm 1.50 cm/m   RA Area:     9.92 cm LA Vol (A2C):   14.4 ml 9.42 ml/m   RA Volume:   21.50 ml 14.06 ml/m LA Vol (A4C):   19.5 ml 12.75 ml/m LA Biplane Vol: 17.9 ml 11.71 ml/m  AORTIC VALVE                 PULMONIC VALVE AV Area (Vmax): 1.55 cm     PV Vmax:          0.81 m/s AV Vmax:        142.00 cm/s  PV Peak grad:     2.6 mmHg AV Peak Grad:   8.1 mmHg     PR End Diast Vel: 4.33 msec LVOT Vmax:      96.90 cm/s LVOT Vmean:     61.800 cm/s LVOT VTI:       0.197 m  AORTA Ao Root diam: 2.90 cm Ao Asc diam:  2.80 cm MITRAL VALVE               TRICUSPID VALVE MV Area (PHT): 3.28 cm    TR Peak grad:   34.8 mmHg MV Decel Time: 231 msec    TR Vmax:        295.00 cm/s MV E velocity: 99.45 cm/s MV A velocity: 97.75 cm/s  SHUNTS MV E/A ratio:  1.02        Systemic VTI:  0.20 m                            Systemic Diam: 1.70 cm Serafina Royals MD Electronically signed by Serafina Royals MD Signature Date/Time: 02/17/2022/10:23:06 AM    Final       Time coordinating discharge: over 30 minutes  SIGNED:  Emeterio Reeve DO [facility]

## 2022-02-21 DIAGNOSIS — E038 Other specified hypothyroidism: Secondary | ICD-10-CM | POA: Diagnosis not present

## 2022-02-21 DIAGNOSIS — M6281 Muscle weakness (generalized): Secondary | ICD-10-CM | POA: Diagnosis not present

## 2022-02-21 DIAGNOSIS — D500 Iron deficiency anemia secondary to blood loss (chronic): Secondary | ICD-10-CM | POA: Diagnosis not present

## 2022-02-21 DIAGNOSIS — R296 Repeated falls: Secondary | ICD-10-CM | POA: Diagnosis not present

## 2022-02-21 DIAGNOSIS — I10 Essential (primary) hypertension: Secondary | ICD-10-CM | POA: Diagnosis not present

## 2022-02-21 DIAGNOSIS — K2100 Gastro-esophageal reflux disease with esophagitis, without bleeding: Secondary | ICD-10-CM | POA: Diagnosis not present

## 2022-02-21 DIAGNOSIS — S72144A Nondisplaced intertrochanteric fracture of right femur, initial encounter for closed fracture: Secondary | ICD-10-CM | POA: Diagnosis not present

## 2022-02-23 DIAGNOSIS — R296 Repeated falls: Secondary | ICD-10-CM | POA: Diagnosis not present

## 2022-02-23 DIAGNOSIS — Z85038 Personal history of other malignant neoplasm of large intestine: Secondary | ICD-10-CM | POA: Diagnosis not present

## 2022-02-23 DIAGNOSIS — D500 Iron deficiency anemia secondary to blood loss (chronic): Secondary | ICD-10-CM | POA: Diagnosis not present

## 2022-02-23 DIAGNOSIS — M6281 Muscle weakness (generalized): Secondary | ICD-10-CM | POA: Diagnosis not present

## 2022-02-23 DIAGNOSIS — E038 Other specified hypothyroidism: Secondary | ICD-10-CM | POA: Diagnosis not present

## 2022-02-23 DIAGNOSIS — E039 Hypothyroidism, unspecified: Secondary | ICD-10-CM | POA: Diagnosis not present

## 2022-02-23 DIAGNOSIS — M8000XG Age-related osteoporosis with current pathological fracture, unspecified site, subsequent encounter for fracture with delayed healing: Secondary | ICD-10-CM | POA: Diagnosis not present

## 2022-02-23 DIAGNOSIS — S72144A Nondisplaced intertrochanteric fracture of right femur, initial encounter for closed fracture: Secondary | ICD-10-CM | POA: Diagnosis not present

## 2022-02-23 DIAGNOSIS — S72141D Displaced intertrochanteric fracture of right femur, subsequent encounter for closed fracture with routine healing: Secondary | ICD-10-CM | POA: Diagnosis not present

## 2022-02-23 DIAGNOSIS — E785 Hyperlipidemia, unspecified: Secondary | ICD-10-CM | POA: Diagnosis not present

## 2022-02-23 DIAGNOSIS — I10 Essential (primary) hypertension: Secondary | ICD-10-CM | POA: Diagnosis not present

## 2022-02-23 DIAGNOSIS — K2100 Gastro-esophageal reflux disease with esophagitis, without bleeding: Secondary | ICD-10-CM | POA: Diagnosis not present

## 2022-02-26 ENCOUNTER — Telehealth: Payer: Self-pay | Admitting: Family Medicine

## 2022-02-26 NOTE — Telephone Encounter (Signed)
Spoke with Tye Maryland, pt's daughter. They are trying to get the patient into [facility] in [place] with pt's spouse but it is in the memory care unit. Tye Maryland states that because pt is at [facility] will take the records from them but Tye Maryland asks if a letter can be written and faxed to [facility], by you, describing the patient's issues with memory? Tye Maryland hopes that will help them be able to get Mrs. Holliman and her step dad in the same room together. Thanks.

## 2022-02-26 NOTE — Telephone Encounter (Signed)
Received call from patient's daughter Cathie regarding request for patient to get a room at [facility] in [place]. She stated they need proof patient has experienced memory loss and told her on Friday they'll only hold a room until tomorrow.   Please advise. Cathie will call back to confirm whether a letter will suffice or if they need documentation.

## 2022-02-26 NOTE — Telephone Encounter (Signed)
Patient's daughter Clarisa Schools called back with an update. Since patient is currently under the care of [facility], [facility] in [place] is requiring documented proof of her memory loss from them. Nothing further needed at this time.

## 2022-02-27 ENCOUNTER — Encounter: Payer: Self-pay | Admitting: Family Medicine

## 2022-02-27 DIAGNOSIS — D62 Acute posthemorrhagic anemia: Secondary | ICD-10-CM | POA: Diagnosis not present

## 2022-02-27 DIAGNOSIS — M8000XG Age-related osteoporosis with current pathological fracture, unspecified site, subsequent encounter for fracture with delayed healing: Secondary | ICD-10-CM | POA: Diagnosis not present

## 2022-02-27 DIAGNOSIS — E039 Hypothyroidism, unspecified: Secondary | ICD-10-CM | POA: Diagnosis not present

## 2022-02-27 DIAGNOSIS — M6281 Muscle weakness (generalized): Secondary | ICD-10-CM | POA: Diagnosis not present

## 2022-02-27 DIAGNOSIS — S72141D Displaced intertrochanteric fracture of right femur, subsequent encounter for closed fracture with routine healing: Secondary | ICD-10-CM | POA: Diagnosis not present

## 2022-02-27 DIAGNOSIS — Z85038 Personal history of other malignant neoplasm of large intestine: Secondary | ICD-10-CM | POA: Diagnosis not present

## 2022-02-27 DIAGNOSIS — S72144D Nondisplaced intertrochanteric fracture of right femur, subsequent encounter for closed fracture with routine healing: Secondary | ICD-10-CM | POA: Diagnosis not present

## 2022-02-27 DIAGNOSIS — I10 Essential (primary) hypertension: Secondary | ICD-10-CM | POA: Diagnosis not present

## 2022-02-27 DIAGNOSIS — M810 Age-related osteoporosis without current pathological fracture: Secondary | ICD-10-CM | POA: Diagnosis not present

## 2022-02-27 DIAGNOSIS — R296 Repeated falls: Secondary | ICD-10-CM | POA: Diagnosis not present

## 2022-02-27 DIAGNOSIS — K5730 Diverticulosis of large intestine without perforation or abscess without bleeding: Secondary | ICD-10-CM | POA: Diagnosis not present

## 2022-02-27 DIAGNOSIS — E038 Other specified hypothyroidism: Secondary | ICD-10-CM | POA: Diagnosis not present

## 2022-02-27 DIAGNOSIS — K2100 Gastro-esophageal reflux disease with esophagitis, without bleeding: Secondary | ICD-10-CM | POA: Diagnosis not present

## 2022-02-27 DIAGNOSIS — E785 Hyperlipidemia, unspecified: Secondary | ICD-10-CM | POA: Diagnosis not present

## 2022-02-28 DIAGNOSIS — S72144D Nondisplaced intertrochanteric fracture of right femur, subsequent encounter for closed fracture with routine healing: Secondary | ICD-10-CM | POA: Diagnosis not present

## 2022-02-28 DIAGNOSIS — R296 Repeated falls: Secondary | ICD-10-CM | POA: Diagnosis not present

## 2022-02-28 DIAGNOSIS — E038 Other specified hypothyroidism: Secondary | ICD-10-CM | POA: Diagnosis not present

## 2022-02-28 DIAGNOSIS — K2100 Gastro-esophageal reflux disease with esophagitis, without bleeding: Secondary | ICD-10-CM | POA: Diagnosis not present

## 2022-02-28 DIAGNOSIS — M6281 Muscle weakness (generalized): Secondary | ICD-10-CM | POA: Diagnosis not present

## 2022-02-28 DIAGNOSIS — D500 Iron deficiency anemia secondary to blood loss (chronic): Secondary | ICD-10-CM | POA: Diagnosis not present

## 2022-02-28 DIAGNOSIS — I10 Essential (primary) hypertension: Secondary | ICD-10-CM | POA: Diagnosis not present

## 2022-03-02 DIAGNOSIS — S72141D Displaced intertrochanteric fracture of right femur, subsequent encounter for closed fracture with routine healing: Secondary | ICD-10-CM | POA: Diagnosis not present

## 2022-03-02 DIAGNOSIS — M6281 Muscle weakness (generalized): Secondary | ICD-10-CM | POA: Diagnosis not present

## 2022-03-02 DIAGNOSIS — Z85038 Personal history of other malignant neoplasm of large intestine: Secondary | ICD-10-CM | POA: Diagnosis not present

## 2022-03-02 DIAGNOSIS — K2100 Gastro-esophageal reflux disease with esophagitis, without bleeding: Secondary | ICD-10-CM | POA: Diagnosis not present

## 2022-03-02 DIAGNOSIS — E785 Hyperlipidemia, unspecified: Secondary | ICD-10-CM | POA: Diagnosis not present

## 2022-03-02 DIAGNOSIS — M8000XG Age-related osteoporosis with current pathological fracture, unspecified site, subsequent encounter for fracture with delayed healing: Secondary | ICD-10-CM | POA: Diagnosis not present

## 2022-03-02 DIAGNOSIS — D500 Iron deficiency anemia secondary to blood loss (chronic): Secondary | ICD-10-CM | POA: Diagnosis not present

## 2022-03-02 DIAGNOSIS — S72144D Nondisplaced intertrochanteric fracture of right femur, subsequent encounter for closed fracture with routine healing: Secondary | ICD-10-CM | POA: Diagnosis not present

## 2022-03-02 DIAGNOSIS — I10 Essential (primary) hypertension: Secondary | ICD-10-CM | POA: Diagnosis not present

## 2022-03-02 DIAGNOSIS — E038 Other specified hypothyroidism: Secondary | ICD-10-CM | POA: Diagnosis not present

## 2022-03-02 DIAGNOSIS — E039 Hypothyroidism, unspecified: Secondary | ICD-10-CM | POA: Diagnosis not present

## 2022-03-02 DIAGNOSIS — R296 Repeated falls: Secondary | ICD-10-CM | POA: Diagnosis not present

## 2022-03-06 DIAGNOSIS — M8000XG Age-related osteoporosis with current pathological fracture, unspecified site, subsequent encounter for fracture with delayed healing: Secondary | ICD-10-CM | POA: Diagnosis not present

## 2022-03-06 DIAGNOSIS — E039 Hypothyroidism, unspecified: Secondary | ICD-10-CM | POA: Diagnosis not present

## 2022-03-06 DIAGNOSIS — E785 Hyperlipidemia, unspecified: Secondary | ICD-10-CM | POA: Diagnosis not present

## 2022-03-06 DIAGNOSIS — S72141D Displaced intertrochanteric fracture of right femur, subsequent encounter for closed fracture with routine healing: Secondary | ICD-10-CM | POA: Diagnosis not present

## 2022-03-06 DIAGNOSIS — Z85038 Personal history of other malignant neoplasm of large intestine: Secondary | ICD-10-CM | POA: Diagnosis not present

## 2022-03-06 DIAGNOSIS — I10 Essential (primary) hypertension: Secondary | ICD-10-CM | POA: Diagnosis not present

## 2022-03-07 DIAGNOSIS — S72001A Fracture of unspecified part of neck of right femur, initial encounter for closed fracture: Secondary | ICD-10-CM | POA: Diagnosis not present

## 2022-03-07 DIAGNOSIS — I82401 Acute embolism and thrombosis of unspecified deep veins of right lower extremity: Secondary | ICD-10-CM | POA: Diagnosis not present

## 2022-03-09 DIAGNOSIS — S72141D Displaced intertrochanteric fracture of right femur, subsequent encounter for closed fracture with routine healing: Secondary | ICD-10-CM | POA: Diagnosis not present

## 2022-03-09 DIAGNOSIS — M8000XG Age-related osteoporosis with current pathological fracture, unspecified site, subsequent encounter for fracture with delayed healing: Secondary | ICD-10-CM | POA: Diagnosis not present

## 2022-03-09 DIAGNOSIS — Z85038 Personal history of other malignant neoplasm of large intestine: Secondary | ICD-10-CM | POA: Diagnosis not present

## 2022-03-09 DIAGNOSIS — I10 Essential (primary) hypertension: Secondary | ICD-10-CM | POA: Diagnosis not present

## 2022-03-09 DIAGNOSIS — E039 Hypothyroidism, unspecified: Secondary | ICD-10-CM | POA: Diagnosis not present

## 2022-03-09 DIAGNOSIS — E785 Hyperlipidemia, unspecified: Secondary | ICD-10-CM | POA: Diagnosis not present

## 2022-03-10 DIAGNOSIS — M6281 Muscle weakness (generalized): Secondary | ICD-10-CM | POA: Diagnosis not present

## 2022-03-10 DIAGNOSIS — K219 Gastro-esophageal reflux disease without esophagitis: Secondary | ICD-10-CM | POA: Diagnosis not present

## 2022-03-10 DIAGNOSIS — S72141D Displaced intertrochanteric fracture of right femur, subsequent encounter for closed fracture with routine healing: Secondary | ICD-10-CM | POA: Diagnosis not present

## 2022-03-10 DIAGNOSIS — I10 Essential (primary) hypertension: Secondary | ICD-10-CM | POA: Diagnosis not present

## 2022-03-12 DIAGNOSIS — S72144D Nondisplaced intertrochanteric fracture of right femur, subsequent encounter for closed fracture with routine healing: Secondary | ICD-10-CM | POA: Diagnosis not present

## 2022-03-12 DIAGNOSIS — K2100 Gastro-esophageal reflux disease with esophagitis, without bleeding: Secondary | ICD-10-CM | POA: Diagnosis not present

## 2022-03-12 DIAGNOSIS — I10 Essential (primary) hypertension: Secondary | ICD-10-CM | POA: Diagnosis not present

## 2022-03-12 DIAGNOSIS — R296 Repeated falls: Secondary | ICD-10-CM | POA: Diagnosis not present

## 2022-03-12 DIAGNOSIS — I82401 Acute embolism and thrombosis of unspecified deep veins of right lower extremity: Secondary | ICD-10-CM | POA: Diagnosis not present

## 2022-03-12 DIAGNOSIS — E038 Other specified hypothyroidism: Secondary | ICD-10-CM | POA: Diagnosis not present

## 2022-03-12 DIAGNOSIS — M6281 Muscle weakness (generalized): Secondary | ICD-10-CM | POA: Diagnosis not present

## 2022-03-12 DIAGNOSIS — D500 Iron deficiency anemia secondary to blood loss (chronic): Secondary | ICD-10-CM | POA: Diagnosis not present

## 2022-03-15 DIAGNOSIS — I82401 Acute embolism and thrombosis of unspecified deep veins of right lower extremity: Secondary | ICD-10-CM | POA: Diagnosis not present

## 2022-03-15 DIAGNOSIS — D500 Iron deficiency anemia secondary to blood loss (chronic): Secondary | ICD-10-CM | POA: Diagnosis not present

## 2022-03-15 DIAGNOSIS — E039 Hypothyroidism, unspecified: Secondary | ICD-10-CM | POA: Diagnosis not present

## 2022-03-15 DIAGNOSIS — S72144D Nondisplaced intertrochanteric fracture of right femur, subsequent encounter for closed fracture with routine healing: Secondary | ICD-10-CM | POA: Diagnosis not present

## 2022-03-15 DIAGNOSIS — G894 Chronic pain syndrome: Secondary | ICD-10-CM | POA: Diagnosis not present

## 2022-03-15 DIAGNOSIS — R269 Unspecified abnormalities of gait and mobility: Secondary | ICD-10-CM | POA: Diagnosis not present

## 2022-03-15 DIAGNOSIS — I10 Essential (primary) hypertension: Secondary | ICD-10-CM | POA: Diagnosis not present

## 2022-03-19 DIAGNOSIS — D500 Iron deficiency anemia secondary to blood loss (chronic): Secondary | ICD-10-CM | POA: Diagnosis not present

## 2022-03-19 DIAGNOSIS — G894 Chronic pain syndrome: Secondary | ICD-10-CM | POA: Diagnosis not present

## 2022-04-11 DIAGNOSIS — S72001A Fracture of unspecified part of neck of right femur, initial encounter for closed fracture: Secondary | ICD-10-CM | POA: Diagnosis not present

## 2022-04-16 ENCOUNTER — Telehealth: Payer: Self-pay | Admitting: Family Medicine

## 2022-04-16 NOTE — Telephone Encounter (Signed)
Left message for patient to call back and schedule Medicare Annual Wellness Visit (AWV) in office.   If not able to come in office, please offer to do virtually or by telephone.   Last AWV:12/01/2020   Please schedule at anytime with [facility] Loma Sousa  If any questions, please contact me at [phone].  Thank you ,  Colletta Maryland

## 2022-04-23 ENCOUNTER — Telehealth: Payer: Self-pay | Admitting: Family Medicine

## 2022-04-23 NOTE — Telephone Encounter (Signed)
Preferred number not in service I left a VM on cell phone [phone].  I left vm asking pt to return my call so we can schedule her for a prolia shot. She will owe $315.00 at the time of service. This is a new process if she prefers to be billed we can do that as well. Please schedule a week out so we can order her prolia.  CB# [phone]

## 2022-04-26 DIAGNOSIS — G894 Chronic pain syndrome: Secondary | ICD-10-CM | POA: Diagnosis not present

## 2022-04-26 DIAGNOSIS — D509 Iron deficiency anemia, unspecified: Secondary | ICD-10-CM | POA: Diagnosis not present

## 2022-05-02 ENCOUNTER — Telehealth: Payer: Self-pay | Admitting: Family Medicine

## 2022-05-02 NOTE — Telephone Encounter (Signed)
Left message for patient to call back and schedule Medicare Annual Wellness Visit (AWV) in office.   If not able to come in office, please offer to do virtually or by telephone.   Last AWV:12/01/2020   Please schedule at anytime with [facility] Loma Sousa  If any questions, please contact me at [phone].  Thank you ,  Colletta Maryland

## 2022-05-04 DIAGNOSIS — E559 Vitamin D deficiency, unspecified: Secondary | ICD-10-CM | POA: Diagnosis not present

## 2022-05-04 DIAGNOSIS — Z79899 Other long term (current) drug therapy: Secondary | ICD-10-CM | POA: Diagnosis not present

## 2022-05-04 DIAGNOSIS — E039 Hypothyroidism, unspecified: Secondary | ICD-10-CM | POA: Diagnosis not present

## 2022-05-09 DIAGNOSIS — D485 Neoplasm of uncertain behavior of skin: Secondary | ICD-10-CM | POA: Diagnosis not present

## 2022-05-09 DIAGNOSIS — Z08 Encounter for follow-up examination after completed treatment for malignant neoplasm: Secondary | ICD-10-CM | POA: Diagnosis not present

## 2022-05-09 DIAGNOSIS — L578 Other skin changes due to chronic exposure to nonionizing radiation: Secondary | ICD-10-CM | POA: Diagnosis not present

## 2022-05-09 DIAGNOSIS — Z85828 Personal history of other malignant neoplasm of skin: Secondary | ICD-10-CM | POA: Diagnosis not present

## 2022-05-09 DIAGNOSIS — C44629 Squamous cell carcinoma of skin of left upper limb, including shoulder: Secondary | ICD-10-CM | POA: Diagnosis not present

## 2022-05-14 DIAGNOSIS — I7091 Generalized atherosclerosis: Secondary | ICD-10-CM | POA: Diagnosis not present

## 2022-05-14 DIAGNOSIS — B351 Tinea unguium: Secondary | ICD-10-CM | POA: Diagnosis not present

## 2022-05-18 DIAGNOSIS — Z79899 Other long term (current) drug therapy: Secondary | ICD-10-CM | POA: Diagnosis not present

## 2022-05-23 DIAGNOSIS — D62 Acute posthemorrhagic anemia: Secondary | ICD-10-CM | POA: Diagnosis not present

## 2022-05-23 DIAGNOSIS — M80051D Age-related osteoporosis with current pathological fracture, right femur, subsequent encounter for fracture with routine healing: Secondary | ICD-10-CM | POA: Diagnosis not present

## 2022-05-23 DIAGNOSIS — C189 Malignant neoplasm of colon, unspecified: Secondary | ICD-10-CM | POA: Diagnosis not present

## 2022-05-25 DIAGNOSIS — D509 Iron deficiency anemia, unspecified: Secondary | ICD-10-CM | POA: Diagnosis not present

## 2022-05-25 DIAGNOSIS — I129 Hypertensive chronic kidney disease with stage 1 through stage 4 chronic kidney disease, or unspecified chronic kidney disease: Secondary | ICD-10-CM | POA: Diagnosis not present

## 2022-05-27 DIAGNOSIS — M80051D Age-related osteoporosis with current pathological fracture, right femur, subsequent encounter for fracture with routine healing: Secondary | ICD-10-CM | POA: Diagnosis not present

## 2022-05-27 DIAGNOSIS — C189 Malignant neoplasm of colon, unspecified: Secondary | ICD-10-CM | POA: Diagnosis not present

## 2022-05-27 DIAGNOSIS — D62 Acute posthemorrhagic anemia: Secondary | ICD-10-CM | POA: Diagnosis not present

## 2022-05-30 DIAGNOSIS — M50121 Cervical disc disorder at C4-C5 level with radiculopathy: Secondary | ICD-10-CM | POA: Diagnosis not present

## 2022-05-30 DIAGNOSIS — E039 Hypothyroidism, unspecified: Secondary | ICD-10-CM | POA: Diagnosis not present

## 2022-05-30 DIAGNOSIS — G894 Chronic pain syndrome: Secondary | ICD-10-CM | POA: Diagnosis not present

## 2022-06-01 ENCOUNTER — Ambulatory Visit (INDEPENDENT_AMBULATORY_CARE_PROVIDER_SITE_OTHER): Payer: Medicare HMO | Admitting: Vascular Surgery

## 2022-06-01 ENCOUNTER — Encounter (INDEPENDENT_AMBULATORY_CARE_PROVIDER_SITE_OTHER): Payer: Self-pay | Admitting: Vascular Surgery

## 2022-06-01 VITALS — BP 170/92 | HR 64 | Resp 15 | Wt 100.0 lb

## 2022-06-01 DIAGNOSIS — I10 Essential (primary) hypertension: Secondary | ICD-10-CM | POA: Diagnosis not present

## 2022-06-01 DIAGNOSIS — I82411 Acute embolism and thrombosis of right femoral vein: Secondary | ICD-10-CM | POA: Diagnosis not present

## 2022-06-01 DIAGNOSIS — I82409 Acute embolism and thrombosis of unspecified deep veins of unspecified lower extremity: Secondary | ICD-10-CM | POA: Insufficient documentation

## 2022-06-01 NOTE — Assessment & Plan Note (Addendum)
The patient is now been getting treatment for right leg DVT which happened following hip fracture surgery.  This was in the distal femoral vein, popliteal vein and tibial vein it was clearly a provoked DVT.  I think 3 months of full 5 mg twice daily Eliquis would be appropriate, then we will plan on dropping her dose to 2.5 mg twice daily for 1 year.  Prescriptions were given today. I have discussed the pathophysiology and natural history of DVT.  I discussed the reason and rationale for treatment.  The patient and her family voiced their understanding and are agreeable with our plan of care.

## 2022-06-01 NOTE — Assessment & Plan Note (Signed)
blood pressure control important in reducing the progression of atherosclerotic disease. On appropriate oral medications.  

## 2022-06-01 NOTE — Progress Notes (Signed)
Patient ID: Joanne Gomez, female   DOB: 10-14-1930, 89 y.o.   MRN: [id number]  Chief Complaint  Patient presents with   New Patient (Initial Visit)    Consult blood clots follow ing surgery    HPI Joanne Gomez is a 89 y.o. female.  I am asked to see the patient by Dr. Mack Guise for evaluation of the patient had DVT after hip fracture surgery.  Hip surgery was about 3 months ago but she has not been treated for her DVT for about 2 months.  At the time of the duplex, she had thrombus in the distal femoral vein, popliteal vein and tibial veins and was appropriately started on Eliquis.  She is currently on 5 mg twice daily of Eliquis and tolerating this well.  No signs of bleeding or problems with the medications.  Not currently having any significant leg swelling.  She still has some pain as she recovers from her hip fracture and surgery, but not necessarily pain related to a DVT.     Past Medical History:  Diagnosis Date   Cancer ([place]) 11/2007   colon stage II   Diverticula of colon    GERD (gastroesophageal reflux disease)    H/O colon cancer, stage II 11/15/2011   HH (hiatus hernia)    History of hiatal hernia    HOH (hard of hearing)    MILD LOSS   Hyperlipidemia    Hypothyroidism    Osteoporosis    Pneumonia     Past Surgical History:  Procedure Laterality Date   ABDOMINAL HYSTERECTOMY     APPENDECTOMY     CATARACT EXTRACTION W/PHACO Right 08/30/2015   Procedure: CATARACT EXTRACTION PHACO AND INTRAOCULAR LENS PLACEMENT ([place]);  Surgeon: Birder Robson, MD;  Location: [facility];  Service: Ophthalmology;  Laterality: Right;   [place]    00:40.8 AP%  15.7 CDE    6.42   fluid pack lot #[id number]  exp 02/24/2017   CATARACT EXTRACTION W/PHACO Left 09/20/2015   Procedure: CATARACT EXTRACTION PHACO AND INTRAOCULAR LENS PLACEMENT ([facility]);  Surgeon: Birder Robson, MD;  Location: [facility];  Service: Ophthalmology;  Laterality: Left;  [place] 44.8 AP% 19.6 CDE 8.77 FLUID PACK LOT #  [id number]   CHOLECYSTECTOMY     ELBOW SURGERY     right    hemocolectomy  2009   INTRAMEDULLARY (IM) NAIL INTERTROCHANTERIC Right 02/17/2022   Procedure: INTRAMEDULLARY (IM) NAIL INTERTROCHANTERIC;  Surgeon: Thornton Park, MD;  Location: [facility];  Service: Orthopedics;  Laterality: Right;   VAGINAL PROLAPSE REPAIR       Family History  Problem Relation Age of Onset   Heart attack Sister 96   Hyperlipidemia Sister    Hypertension Sister    Breast cancer Neg Hx       Social History   Tobacco Use   Smoking status: Never   Smokeless tobacco: Never  Vaping Use   Vaping Use: Never used  Substance Use Topics   Alcohol use: Yes    Comment: occ   Drug use: No     No Known Allergies  Current Outpatient Medications  Medication Sig Dispense Refill   alum & mag hydroxide-simeth (MAALOX/MYLANTA) 200-200-20 MG/5ML suspension Take 30 mLs by mouth every 4 (four) hours as needed for indigestion. 355 mL 0   aspirin EC 81 MG tablet Take 1 tablet (81 mg total) by mouth daily. Swallow whole. 30 tablet 0   bisacodyl (DULCOLAX) 10 MG suppository Place 1 suppository (10 mg total) rectally  daily as needed for moderate constipation. 12 suppository 0   CALCIUM CITRATE PO Take 1 tablet by mouth 2 (two) times daily. '630mg'$      Cholecalciferol (VITAMIN D3) 2000 units TABS Take 1 tablet by mouth daily.     docusate sodium (COLACE) 100 MG capsule Take 1 capsule (100 mg total) by mouth 2 (two) times daily. 10 capsule 0   esomeprazole (NEXIUM) 40 MG capsule Take 40 mg by mouth daily as needed.     Fluocinolone Acetonide 0.01 % OIL Place 3 drops in ear(s) 2 (two) times daily as needed. 20 mL 0   HYDROcodone-acetaminophen (NORCO/VICODIN) 5-325 MG tablet Take 1 tablet by mouth every 6 (six) hours as needed for moderate pain. 30 tablet 0   Lifitegrast (XIIDRA) 5 % SOLN Xiidra 5 % eye drops in a dropperette     Magnesium 250 MG TABS Take 1 tablet by mouth daily.     methocarbamol (ROBAXIN) 500 MG tablet  Take 1 tablet (500 mg total) by mouth every 6 (six) hours as needed for muscle spasms. 30 tablet 0   metoprolol tartrate (LOPRESSOR) 25 MG tablet Take 0.5 tablets (12.5 mg total) by mouth 2 (two) times daily. 60 tablet 0   Multiple Vitamin (MULTIVITAMIN) tablet Take 1 tablet by mouth daily.     nystatin ointment (MYCOSTATIN) Apply 1 application. topically 2 (two) times daily as needed. 30 g 3   ondansetron (ZOFRAN) 4 MG tablet Take 1 tablet (4 mg total) by mouth every 6 (six) hours as needed for nausea. 20 tablet 0   polyethylene glycol (MIRALAX / GLYCOLAX) 17 g packet Take 17 g by mouth daily as needed for mild constipation. 14 each 0   senna (SENOKOT) 8.6 MG TABS tablet Take 1 tablet (8.6 mg total) by mouth 2 (two) times daily. While on opiate pain medications 120 tablet 0   thyroid (ARMOUR THYROID) 60 MG tablet TAKE 1 & 1/2 TABLETS FOUR TIMES A WEEK AND TAKE 1 TABLET THREE TIMES A WEEK 135 tablet 1   triamcinolone (NASACORT) 55 MCG/ACT AERO nasal inhaler 2 sprays 2 (two) times daily.     vitamin B-12 (CYANOCOBALAMIN) 1000 MCG tablet Take 5,000 mcg by mouth daily.     vitamin E 100 UNIT capsule Take 200 Units by mouth daily.     enoxaparin (LOVENOX) 40 MG/0.4ML injection Inject 0.4 mLs (40 mg total) into the skin daily for 10 days. 4 mL 0   No current facility-administered medications for this visit.      REVIEW OF SYSTEMS (Negative unless checked)  Constitutional: '[]'$ Weight loss  '[]'$ Fever  '[]'$ Chills Cardiac: '[]'$ Chest pain   '[]'$ Chest pressure   '[]'$ Palpitations   '[]'$ Shortness of breath when laying flat   '[]'$ Shortness of breath at rest   '[]'$ Shortness of breath with exertion. Vascular:  '[]'$ Pain in legs with walking   '[]'$ Pain in legs at rest   '[]'$ Pain in legs when laying flat   '[]'$ Claudication   '[]'$ Pain in feet when walking  '[]'$ Pain in feet at rest  '[]'$ Pain in feet when laying flat   '[x]'$ History of DVT   '[x]'$ Phlebitis   '[]'$ Swelling in legs   '[]'$ Varicose veins   '[]'$ Non-healing ulcers Pulmonary:   '[]'$ Uses home oxygen    '[]'$ Productive cough   '[]'$ Hemoptysis   '[]'$ Wheeze  '[]'$ COPD   '[]'$ Asthma Neurologic:  '[]'$ Dizziness  '[]'$ Blackouts   '[]'$ Seizures   '[]'$ History of stroke   '[]'$ History of TIA  '[]'$ Aphasia   '[]'$ Temporary blindness   '[]'$ Dysphagia   '[]'$ Weakness or numbness in arms   '[]'$   Weakness or numbness in legs Musculoskeletal:  '[x]'$ Arthritis   '[]'$ Joint swelling   '[x]'$ Joint pain   '[]'$ Low back pain Hematologic:  '[]'$ Easy bruising  '[]'$ Easy bleeding   '[]'$ Hypercoagulable state   '[]'$ Anemic  '[]'$ Hepatitis Gastrointestinal:  '[]'$ Blood in stool   '[]'$ Vomiting blood  '[x]'$ Gastroesophageal reflux/heartburn   '[]'$ Abdominal pain Genitourinary:  '[]'$ Chronic kidney disease   '[]'$ Difficult urination  '[]'$ Frequent urination  '[]'$ Burning with urination   '[]'$ Hematuria Skin:  '[]'$ Rashes   '[]'$ Ulcers   '[]'$ Wounds Psychological:  '[]'$ History of anxiety   '[]'$  History of major depression.    Physical Exam BP (!) 170/92 (BP Location: Right Arm)   Pulse 64   Resp 15   Wt 100 lb (45.4 kg)   BMI 17.16 kg/m  Gen:  WD/WN, NAD Head: Gray/AT, + temporalis wasting.  Ear/Nose/Throat: Hearing diminished, nares w/o erythema or drainage, oropharynx w/o Erythema/Exudate Eyes: Conjunctiva clear, sclera non-icteric  Neck: trachea midline.  No JVD.  Pulmonary:  Good air movement, respirations not labored, no use of accessory muscles  Cardiac: RRR, no JVD Vascular:  Vessel Right Left  Radial Palpable Palpable                                   Gastrointestinal:. No masses, surgical incisions, or scars. Musculoskeletal: M/S 5/5 throughout.  Extremities without ischemic changes.  No deformity or atrophy. No appreciable LE edema. Neurologic: Sensation grossly intact in extremities.  Symmetrical.  Speech is fluent. Motor exam as listed above. Psychiatric: Judgment intact, Mood & affect appropriate for pt's clinical situation. Dermatologic: No rashes or ulcers noted.  No cellulitis or open wounds.    Radiology No results found.  Labs No results found for this or any previous visit (from the  past 2160 hour(s)).  Assessment/Plan:  DVT (deep venous thrombosis) (HCC) The patient is now been getting treatment for right leg DVT which happened following hip fracture surgery.  This was in the distal femoral vein, popliteal vein and tibial vein it was clearly a provoked DVT.  I think 3 months of full 5 mg twice daily Eliquis would be appropriate, then we will plan on dropping her dose to 2.5 mg twice daily for 1 year.  Prescriptions were given today. I have discussed the pathophysiology and natural history of DVT.  I discussed the reason and rationale for treatment.  The patient and her family voiced their understanding and are agreeable with our plan of care.  Essential hypertension blood pressure control important in reducing the progression of atherosclerotic disease. On appropriate oral medications.      Leotis Pain 06/01/2022, 12:12 PM   This note was created with Dragon medical transcription system.  Any errors from dictation are unintentional.

## 2022-06-06 DIAGNOSIS — S72001A Fracture of unspecified part of neck of right femur, initial encounter for closed fracture: Secondary | ICD-10-CM | POA: Diagnosis not present

## 2022-06-08 DIAGNOSIS — Z79899 Other long term (current) drug therapy: Secondary | ICD-10-CM | POA: Diagnosis not present

## 2022-06-14 DIAGNOSIS — D509 Iron deficiency anemia, unspecified: Secondary | ICD-10-CM | POA: Diagnosis not present

## 2022-06-14 DIAGNOSIS — I82401 Acute embolism and thrombosis of unspecified deep veins of right lower extremity: Secondary | ICD-10-CM | POA: Diagnosis not present

## 2022-06-14 DIAGNOSIS — I129 Hypertensive chronic kidney disease with stage 1 through stage 4 chronic kidney disease, or unspecified chronic kidney disease: Secondary | ICD-10-CM | POA: Diagnosis not present

## 2022-06-14 DIAGNOSIS — E039 Hypothyroidism, unspecified: Secondary | ICD-10-CM | POA: Diagnosis not present

## 2022-06-14 DIAGNOSIS — D519 Vitamin B12 deficiency anemia, unspecified: Secondary | ICD-10-CM | POA: Diagnosis not present

## 2022-06-26 DIAGNOSIS — I739 Peripheral vascular disease, unspecified: Secondary | ICD-10-CM | POA: Diagnosis not present

## 2022-06-26 DIAGNOSIS — F320 Major depressive disorder, single episode, mild: Secondary | ICD-10-CM | POA: Diagnosis not present

## 2022-06-27 DIAGNOSIS — M25551 Pain in right hip: Secondary | ICD-10-CM | POA: Diagnosis not present

## 2022-06-27 DIAGNOSIS — G894 Chronic pain syndrome: Secondary | ICD-10-CM | POA: Diagnosis not present

## 2022-06-27 DIAGNOSIS — E039 Hypothyroidism, unspecified: Secondary | ICD-10-CM | POA: Diagnosis not present

## 2022-06-27 DIAGNOSIS — C44629 Squamous cell carcinoma of skin of left upper limb, including shoulder: Secondary | ICD-10-CM | POA: Diagnosis not present

## 2022-06-27 DIAGNOSIS — R269 Unspecified abnormalities of gait and mobility: Secondary | ICD-10-CM | POA: Diagnosis not present

## 2022-06-27 DIAGNOSIS — M80051D Age-related osteoporosis with current pathological fracture, right femur, subsequent encounter for fracture with routine healing: Secondary | ICD-10-CM | POA: Diagnosis not present

## 2022-06-27 DIAGNOSIS — L820 Inflamed seborrheic keratosis: Secondary | ICD-10-CM | POA: Diagnosis not present

## 2022-06-27 DIAGNOSIS — L538 Other specified erythematous conditions: Secondary | ICD-10-CM | POA: Diagnosis not present

## 2022-06-27 DIAGNOSIS — D2362 Other benign neoplasm of skin of left upper limb, including shoulder: Secondary | ICD-10-CM | POA: Diagnosis not present

## 2022-06-27 DIAGNOSIS — M50121 Cervical disc disorder at C4-C5 level with radiculopathy: Secondary | ICD-10-CM | POA: Diagnosis not present

## 2022-07-11 DIAGNOSIS — I739 Peripheral vascular disease, unspecified: Secondary | ICD-10-CM | POA: Diagnosis not present

## 2022-07-11 DIAGNOSIS — Z86718 Personal history of other venous thrombosis and embolism: Secondary | ICD-10-CM | POA: Diagnosis not present

## 2022-07-11 DIAGNOSIS — Z1331 Encounter for screening for depression: Secondary | ICD-10-CM | POA: Diagnosis not present

## 2022-07-11 DIAGNOSIS — I129 Hypertensive chronic kidney disease with stage 1 through stage 4 chronic kidney disease, or unspecified chronic kidney disease: Secondary | ICD-10-CM | POA: Diagnosis not present

## 2022-07-11 DIAGNOSIS — E039 Hypothyroidism, unspecified: Secondary | ICD-10-CM | POA: Diagnosis not present

## 2022-07-11 DIAGNOSIS — Z79899 Other long term (current) drug therapy: Secondary | ICD-10-CM | POA: Diagnosis not present

## 2022-07-11 DIAGNOSIS — N182 Chronic kidney disease, stage 2 (mild): Secondary | ICD-10-CM | POA: Diagnosis not present

## 2022-07-11 DIAGNOSIS — R634 Abnormal weight loss: Secondary | ICD-10-CM | POA: Diagnosis not present

## 2022-07-11 DIAGNOSIS — F320 Major depressive disorder, single episode, mild: Secondary | ICD-10-CM | POA: Diagnosis not present

## 2022-07-23 DIAGNOSIS — I739 Peripheral vascular disease, unspecified: Secondary | ICD-10-CM | POA: Diagnosis not present

## 2022-07-23 DIAGNOSIS — F320 Major depressive disorder, single episode, mild: Secondary | ICD-10-CM | POA: Diagnosis not present

## 2022-07-25 DIAGNOSIS — M542 Cervicalgia: Secondary | ICD-10-CM | POA: Diagnosis not present

## 2022-07-25 DIAGNOSIS — G894 Chronic pain syndrome: Secondary | ICD-10-CM | POA: Diagnosis not present

## 2022-08-08 DIAGNOSIS — I739 Peripheral vascular disease, unspecified: Secondary | ICD-10-CM | POA: Diagnosis not present

## 2022-08-08 DIAGNOSIS — R634 Abnormal weight loss: Secondary | ICD-10-CM | POA: Diagnosis not present

## 2022-08-08 DIAGNOSIS — R269 Unspecified abnormalities of gait and mobility: Secondary | ICD-10-CM | POA: Diagnosis not present

## 2022-08-08 DIAGNOSIS — E039 Hypothyroidism, unspecified: Secondary | ICD-10-CM | POA: Diagnosis not present

## 2022-08-13 DIAGNOSIS — C189 Malignant neoplasm of colon, unspecified: Secondary | ICD-10-CM | POA: Diagnosis not present

## 2022-08-13 DIAGNOSIS — M80051D Age-related osteoporosis with current pathological fracture, right femur, subsequent encounter for fracture with routine healing: Secondary | ICD-10-CM | POA: Diagnosis not present

## 2022-08-13 DIAGNOSIS — D62 Acute posthemorrhagic anemia: Secondary | ICD-10-CM | POA: Diagnosis not present

## 2022-08-15 DIAGNOSIS — S72001A Fracture of unspecified part of neck of right femur, initial encounter for closed fracture: Secondary | ICD-10-CM | POA: Diagnosis not present

## 2022-08-17 DIAGNOSIS — Z79899 Other long term (current) drug therapy: Secondary | ICD-10-CM | POA: Diagnosis not present

## 2022-08-22 DIAGNOSIS — E039 Hypothyroidism, unspecified: Secondary | ICD-10-CM | POA: Diagnosis not present

## 2022-08-22 DIAGNOSIS — R269 Unspecified abnormalities of gait and mobility: Secondary | ICD-10-CM | POA: Diagnosis not present

## 2022-08-22 DIAGNOSIS — S72144D Nondisplaced intertrochanteric fracture of right femur, subsequent encounter for closed fracture with routine healing: Secondary | ICD-10-CM | POA: Diagnosis not present

## 2022-08-22 DIAGNOSIS — M50121 Cervical disc disorder at C4-C5 level with radiculopathy: Secondary | ICD-10-CM | POA: Diagnosis not present

## 2022-08-24 DIAGNOSIS — F320 Major depressive disorder, single episode, mild: Secondary | ICD-10-CM | POA: Diagnosis not present

## 2022-08-24 DIAGNOSIS — B351 Tinea unguium: Secondary | ICD-10-CM | POA: Diagnosis not present

## 2022-08-24 DIAGNOSIS — I739 Peripheral vascular disease, unspecified: Secondary | ICD-10-CM | POA: Diagnosis not present

## 2022-08-24 DIAGNOSIS — I7091 Generalized atherosclerosis: Secondary | ICD-10-CM | POA: Diagnosis not present

## 2022-09-05 DIAGNOSIS — J309 Allergic rhinitis, unspecified: Secondary | ICD-10-CM | POA: Diagnosis not present

## 2022-09-05 DIAGNOSIS — R269 Unspecified abnormalities of gait and mobility: Secondary | ICD-10-CM | POA: Diagnosis not present

## 2022-09-05 DIAGNOSIS — E039 Hypothyroidism, unspecified: Secondary | ICD-10-CM | POA: Diagnosis not present

## 2022-09-05 DIAGNOSIS — K219 Gastro-esophageal reflux disease without esophagitis: Secondary | ICD-10-CM | POA: Diagnosis not present

## 2022-09-12 DIAGNOSIS — J302 Other seasonal allergic rhinitis: Secondary | ICD-10-CM | POA: Diagnosis not present

## 2022-09-12 DIAGNOSIS — E039 Hypothyroidism, unspecified: Secondary | ICD-10-CM | POA: Diagnosis not present

## 2022-09-12 DIAGNOSIS — Z7901 Long term (current) use of anticoagulants: Secondary | ICD-10-CM | POA: Diagnosis not present

## 2022-09-12 DIAGNOSIS — Z9181 History of falling: Secondary | ICD-10-CM | POA: Diagnosis not present

## 2022-09-12 DIAGNOSIS — I10 Essential (primary) hypertension: Secondary | ICD-10-CM | POA: Diagnosis not present

## 2022-09-12 DIAGNOSIS — Z7982 Long term (current) use of aspirin: Secondary | ICD-10-CM | POA: Diagnosis not present

## 2022-09-12 DIAGNOSIS — M80052D Age-related osteoporosis with current pathological fracture, left femur, subsequent encounter for fracture with routine healing: Secondary | ICD-10-CM | POA: Diagnosis not present

## 2022-09-12 DIAGNOSIS — K219 Gastro-esophageal reflux disease without esophagitis: Secondary | ICD-10-CM | POA: Diagnosis not present

## 2022-09-14 DIAGNOSIS — E039 Hypothyroidism, unspecified: Secondary | ICD-10-CM | POA: Diagnosis not present

## 2022-09-17 DIAGNOSIS — Z9181 History of falling: Secondary | ICD-10-CM | POA: Diagnosis not present

## 2022-09-17 DIAGNOSIS — K219 Gastro-esophageal reflux disease without esophagitis: Secondary | ICD-10-CM | POA: Diagnosis not present

## 2022-09-17 DIAGNOSIS — M80052D Age-related osteoporosis with current pathological fracture, left femur, subsequent encounter for fracture with routine healing: Secondary | ICD-10-CM | POA: Diagnosis not present

## 2022-09-17 DIAGNOSIS — Z7901 Long term (current) use of anticoagulants: Secondary | ICD-10-CM | POA: Diagnosis not present

## 2022-09-17 DIAGNOSIS — I10 Essential (primary) hypertension: Secondary | ICD-10-CM | POA: Diagnosis not present

## 2022-09-17 DIAGNOSIS — Z7982 Long term (current) use of aspirin: Secondary | ICD-10-CM | POA: Diagnosis not present

## 2022-09-17 DIAGNOSIS — E039 Hypothyroidism, unspecified: Secondary | ICD-10-CM | POA: Diagnosis not present

## 2022-09-17 DIAGNOSIS — J302 Other seasonal allergic rhinitis: Secondary | ICD-10-CM | POA: Diagnosis not present

## 2022-09-19 DIAGNOSIS — G8929 Other chronic pain: Secondary | ICD-10-CM | POA: Diagnosis not present

## 2022-09-19 DIAGNOSIS — I10 Essential (primary) hypertension: Secondary | ICD-10-CM | POA: Diagnosis not present

## 2022-09-19 DIAGNOSIS — K219 Gastro-esophageal reflux disease without esophagitis: Secondary | ICD-10-CM | POA: Diagnosis not present

## 2022-09-19 DIAGNOSIS — M80052D Age-related osteoporosis with current pathological fracture, left femur, subsequent encounter for fracture with routine healing: Secondary | ICD-10-CM | POA: Diagnosis not present

## 2022-09-19 DIAGNOSIS — I739 Peripheral vascular disease, unspecified: Secondary | ICD-10-CM | POA: Diagnosis not present

## 2022-09-19 DIAGNOSIS — Z7982 Long term (current) use of aspirin: Secondary | ICD-10-CM | POA: Diagnosis not present

## 2022-09-19 DIAGNOSIS — Z9181 History of falling: Secondary | ICD-10-CM | POA: Diagnosis not present

## 2022-09-19 DIAGNOSIS — Z7901 Long term (current) use of anticoagulants: Secondary | ICD-10-CM | POA: Diagnosis not present

## 2022-09-19 DIAGNOSIS — J302 Other seasonal allergic rhinitis: Secondary | ICD-10-CM | POA: Diagnosis not present

## 2022-09-19 DIAGNOSIS — E039 Hypothyroidism, unspecified: Secondary | ICD-10-CM | POA: Diagnosis not present

## 2022-09-20 DIAGNOSIS — M50121 Cervical disc disorder at C4-C5 level with radiculopathy: Secondary | ICD-10-CM | POA: Diagnosis not present

## 2022-09-20 DIAGNOSIS — E039 Hypothyroidism, unspecified: Secondary | ICD-10-CM | POA: Diagnosis not present

## 2022-09-20 DIAGNOSIS — R269 Unspecified abnormalities of gait and mobility: Secondary | ICD-10-CM | POA: Diagnosis not present

## 2022-09-20 DIAGNOSIS — G894 Chronic pain syndrome: Secondary | ICD-10-CM | POA: Diagnosis not present

## 2022-09-24 DIAGNOSIS — J302 Other seasonal allergic rhinitis: Secondary | ICD-10-CM | POA: Diagnosis not present

## 2022-09-24 DIAGNOSIS — K219 Gastro-esophageal reflux disease without esophagitis: Secondary | ICD-10-CM | POA: Diagnosis not present

## 2022-09-24 DIAGNOSIS — Z7982 Long term (current) use of aspirin: Secondary | ICD-10-CM | POA: Diagnosis not present

## 2022-09-24 DIAGNOSIS — I10 Essential (primary) hypertension: Secondary | ICD-10-CM | POA: Diagnosis not present

## 2022-09-24 DIAGNOSIS — E039 Hypothyroidism, unspecified: Secondary | ICD-10-CM | POA: Diagnosis not present

## 2022-09-24 DIAGNOSIS — M80052D Age-related osteoporosis with current pathological fracture, left femur, subsequent encounter for fracture with routine healing: Secondary | ICD-10-CM | POA: Diagnosis not present

## 2022-09-24 DIAGNOSIS — Z9181 History of falling: Secondary | ICD-10-CM | POA: Diagnosis not present

## 2022-09-24 DIAGNOSIS — Z7901 Long term (current) use of anticoagulants: Secondary | ICD-10-CM | POA: Diagnosis not present

## 2022-09-26 DIAGNOSIS — M50121 Cervical disc disorder at C4-C5 level with radiculopathy: Secondary | ICD-10-CM | POA: Diagnosis not present

## 2022-09-26 DIAGNOSIS — Z9181 History of falling: Secondary | ICD-10-CM | POA: Diagnosis not present

## 2022-09-26 DIAGNOSIS — G894 Chronic pain syndrome: Secondary | ICD-10-CM | POA: Diagnosis not present

## 2022-09-26 DIAGNOSIS — H04123 Dry eye syndrome of bilateral lacrimal glands: Secondary | ICD-10-CM | POA: Diagnosis not present

## 2022-09-26 DIAGNOSIS — M80052D Age-related osteoporosis with current pathological fracture, left femur, subsequent encounter for fracture with routine healing: Secondary | ICD-10-CM | POA: Diagnosis not present

## 2022-09-26 DIAGNOSIS — J302 Other seasonal allergic rhinitis: Secondary | ICD-10-CM | POA: Diagnosis not present

## 2022-09-26 DIAGNOSIS — K219 Gastro-esophageal reflux disease without esophagitis: Secondary | ICD-10-CM | POA: Diagnosis not present

## 2022-09-26 DIAGNOSIS — Z7901 Long term (current) use of anticoagulants: Secondary | ICD-10-CM | POA: Diagnosis not present

## 2022-09-26 DIAGNOSIS — I10 Essential (primary) hypertension: Secondary | ICD-10-CM | POA: Diagnosis not present

## 2022-09-26 DIAGNOSIS — Z7982 Long term (current) use of aspirin: Secondary | ICD-10-CM | POA: Diagnosis not present

## 2022-09-26 DIAGNOSIS — E039 Hypothyroidism, unspecified: Secondary | ICD-10-CM | POA: Diagnosis not present

## 2022-10-01 DIAGNOSIS — K219 Gastro-esophageal reflux disease without esophagitis: Secondary | ICD-10-CM | POA: Diagnosis not present

## 2022-10-01 DIAGNOSIS — Z9181 History of falling: Secondary | ICD-10-CM | POA: Diagnosis not present

## 2022-10-01 DIAGNOSIS — J302 Other seasonal allergic rhinitis: Secondary | ICD-10-CM | POA: Diagnosis not present

## 2022-10-01 DIAGNOSIS — Z7901 Long term (current) use of anticoagulants: Secondary | ICD-10-CM | POA: Diagnosis not present

## 2022-10-01 DIAGNOSIS — E039 Hypothyroidism, unspecified: Secondary | ICD-10-CM | POA: Diagnosis not present

## 2022-10-01 DIAGNOSIS — M80052D Age-related osteoporosis with current pathological fracture, left femur, subsequent encounter for fracture with routine healing: Secondary | ICD-10-CM | POA: Diagnosis not present

## 2022-10-01 DIAGNOSIS — I10 Essential (primary) hypertension: Secondary | ICD-10-CM | POA: Diagnosis not present

## 2022-10-01 DIAGNOSIS — Z7982 Long term (current) use of aspirin: Secondary | ICD-10-CM | POA: Diagnosis not present

## 2022-10-03 DIAGNOSIS — D509 Iron deficiency anemia, unspecified: Secondary | ICD-10-CM | POA: Diagnosis not present

## 2022-10-03 DIAGNOSIS — M50121 Cervical disc disorder at C4-C5 level with radiculopathy: Secondary | ICD-10-CM | POA: Diagnosis not present

## 2022-10-03 DIAGNOSIS — I129 Hypertensive chronic kidney disease with stage 1 through stage 4 chronic kidney disease, or unspecified chronic kidney disease: Secondary | ICD-10-CM | POA: Diagnosis not present

## 2022-10-03 DIAGNOSIS — R269 Unspecified abnormalities of gait and mobility: Secondary | ICD-10-CM | POA: Diagnosis not present

## 2022-10-03 DIAGNOSIS — M542 Cervicalgia: Secondary | ICD-10-CM | POA: Diagnosis not present

## 2022-10-03 DIAGNOSIS — N182 Chronic kidney disease, stage 2 (mild): Secondary | ICD-10-CM | POA: Diagnosis not present

## 2022-10-08 DIAGNOSIS — Z7901 Long term (current) use of anticoagulants: Secondary | ICD-10-CM | POA: Diagnosis not present

## 2022-10-08 DIAGNOSIS — J302 Other seasonal allergic rhinitis: Secondary | ICD-10-CM | POA: Diagnosis not present

## 2022-10-08 DIAGNOSIS — E039 Hypothyroidism, unspecified: Secondary | ICD-10-CM | POA: Diagnosis not present

## 2022-10-08 DIAGNOSIS — Z9181 History of falling: Secondary | ICD-10-CM | POA: Diagnosis not present

## 2022-10-08 DIAGNOSIS — Z7982 Long term (current) use of aspirin: Secondary | ICD-10-CM | POA: Diagnosis not present

## 2022-10-08 DIAGNOSIS — M80052D Age-related osteoporosis with current pathological fracture, left femur, subsequent encounter for fracture with routine healing: Secondary | ICD-10-CM | POA: Diagnosis not present

## 2022-10-08 DIAGNOSIS — K219 Gastro-esophageal reflux disease without esophagitis: Secondary | ICD-10-CM | POA: Diagnosis not present

## 2022-10-08 DIAGNOSIS — I10 Essential (primary) hypertension: Secondary | ICD-10-CM | POA: Diagnosis not present

## 2022-10-11 DIAGNOSIS — K219 Gastro-esophageal reflux disease without esophagitis: Secondary | ICD-10-CM | POA: Diagnosis not present

## 2022-10-11 DIAGNOSIS — Z7982 Long term (current) use of aspirin: Secondary | ICD-10-CM | POA: Diagnosis not present

## 2022-10-11 DIAGNOSIS — M80052D Age-related osteoporosis with current pathological fracture, left femur, subsequent encounter for fracture with routine healing: Secondary | ICD-10-CM | POA: Diagnosis not present

## 2022-10-11 DIAGNOSIS — J302 Other seasonal allergic rhinitis: Secondary | ICD-10-CM | POA: Diagnosis not present

## 2022-10-11 DIAGNOSIS — E039 Hypothyroidism, unspecified: Secondary | ICD-10-CM | POA: Diagnosis not present

## 2022-10-11 DIAGNOSIS — I10 Essential (primary) hypertension: Secondary | ICD-10-CM | POA: Diagnosis not present

## 2022-10-11 DIAGNOSIS — Z7901 Long term (current) use of anticoagulants: Secondary | ICD-10-CM | POA: Diagnosis not present

## 2022-10-11 DIAGNOSIS — Z9181 History of falling: Secondary | ICD-10-CM | POA: Diagnosis not present

## 2022-10-15 DIAGNOSIS — E039 Hypothyroidism, unspecified: Secondary | ICD-10-CM | POA: Diagnosis not present

## 2022-10-15 DIAGNOSIS — J302 Other seasonal allergic rhinitis: Secondary | ICD-10-CM | POA: Diagnosis not present

## 2022-10-15 DIAGNOSIS — I10 Essential (primary) hypertension: Secondary | ICD-10-CM | POA: Diagnosis not present

## 2022-10-15 DIAGNOSIS — Z7982 Long term (current) use of aspirin: Secondary | ICD-10-CM | POA: Diagnosis not present

## 2022-10-15 DIAGNOSIS — Z7901 Long term (current) use of anticoagulants: Secondary | ICD-10-CM | POA: Diagnosis not present

## 2022-10-15 DIAGNOSIS — M80052D Age-related osteoporosis with current pathological fracture, left femur, subsequent encounter for fracture with routine healing: Secondary | ICD-10-CM | POA: Diagnosis not present

## 2022-10-15 DIAGNOSIS — Z9181 History of falling: Secondary | ICD-10-CM | POA: Diagnosis not present

## 2022-10-15 DIAGNOSIS — K219 Gastro-esophageal reflux disease without esophagitis: Secondary | ICD-10-CM | POA: Diagnosis not present

## 2022-10-16 DIAGNOSIS — I10 Essential (primary) hypertension: Secondary | ICD-10-CM | POA: Diagnosis not present

## 2022-10-16 DIAGNOSIS — Z9181 History of falling: Secondary | ICD-10-CM | POA: Diagnosis not present

## 2022-10-16 DIAGNOSIS — M80052D Age-related osteoporosis with current pathological fracture, left femur, subsequent encounter for fracture with routine healing: Secondary | ICD-10-CM | POA: Diagnosis not present

## 2022-10-16 DIAGNOSIS — Z7982 Long term (current) use of aspirin: Secondary | ICD-10-CM | POA: Diagnosis not present

## 2022-10-16 DIAGNOSIS — E039 Hypothyroidism, unspecified: Secondary | ICD-10-CM | POA: Diagnosis not present

## 2022-10-16 DIAGNOSIS — Z7901 Long term (current) use of anticoagulants: Secondary | ICD-10-CM | POA: Diagnosis not present

## 2022-10-16 DIAGNOSIS — K219 Gastro-esophageal reflux disease without esophagitis: Secondary | ICD-10-CM | POA: Diagnosis not present

## 2022-10-16 DIAGNOSIS — J302 Other seasonal allergic rhinitis: Secondary | ICD-10-CM | POA: Diagnosis not present

## 2022-10-17 DIAGNOSIS — R2681 Unsteadiness on feet: Secondary | ICD-10-CM | POA: Diagnosis not present

## 2022-10-17 DIAGNOSIS — M542 Cervicalgia: Secondary | ICD-10-CM | POA: Diagnosis not present

## 2022-10-17 DIAGNOSIS — G8929 Other chronic pain: Secondary | ICD-10-CM | POA: Diagnosis not present

## 2022-10-23 DIAGNOSIS — Z9181 History of falling: Secondary | ICD-10-CM | POA: Diagnosis not present

## 2022-10-23 DIAGNOSIS — M80052D Age-related osteoporosis with current pathological fracture, left femur, subsequent encounter for fracture with routine healing: Secondary | ICD-10-CM | POA: Diagnosis not present

## 2022-10-23 DIAGNOSIS — Z7982 Long term (current) use of aspirin: Secondary | ICD-10-CM | POA: Diagnosis not present

## 2022-10-23 DIAGNOSIS — I10 Essential (primary) hypertension: Secondary | ICD-10-CM | POA: Diagnosis not present

## 2022-10-23 DIAGNOSIS — E039 Hypothyroidism, unspecified: Secondary | ICD-10-CM | POA: Diagnosis not present

## 2022-10-23 DIAGNOSIS — K219 Gastro-esophageal reflux disease without esophagitis: Secondary | ICD-10-CM | POA: Diagnosis not present

## 2022-10-23 DIAGNOSIS — Z7901 Long term (current) use of anticoagulants: Secondary | ICD-10-CM | POA: Diagnosis not present

## 2022-10-23 DIAGNOSIS — J302 Other seasonal allergic rhinitis: Secondary | ICD-10-CM | POA: Diagnosis not present

## 2022-10-24 DIAGNOSIS — Z85828 Personal history of other malignant neoplasm of skin: Secondary | ICD-10-CM | POA: Diagnosis not present

## 2022-10-24 DIAGNOSIS — Z08 Encounter for follow-up examination after completed treatment for malignant neoplasm: Secondary | ICD-10-CM | POA: Diagnosis not present

## 2022-10-24 DIAGNOSIS — G8929 Other chronic pain: Secondary | ICD-10-CM | POA: Diagnosis not present

## 2022-10-24 DIAGNOSIS — L821 Other seborrheic keratosis: Secondary | ICD-10-CM | POA: Diagnosis not present

## 2022-10-24 DIAGNOSIS — D2271 Melanocytic nevi of right lower limb, including hip: Secondary | ICD-10-CM | POA: Diagnosis not present

## 2022-10-24 DIAGNOSIS — D2261 Melanocytic nevi of right upper limb, including shoulder: Secondary | ICD-10-CM | POA: Diagnosis not present

## 2022-10-24 DIAGNOSIS — I739 Peripheral vascular disease, unspecified: Secondary | ICD-10-CM | POA: Diagnosis not present

## 2022-10-24 DIAGNOSIS — D2262 Melanocytic nevi of left upper limb, including shoulder: Secondary | ICD-10-CM | POA: Diagnosis not present

## 2022-10-24 DIAGNOSIS — D2272 Melanocytic nevi of left lower limb, including hip: Secondary | ICD-10-CM | POA: Diagnosis not present

## 2022-10-24 DIAGNOSIS — D225 Melanocytic nevi of trunk: Secondary | ICD-10-CM | POA: Diagnosis not present

## 2022-10-25 DIAGNOSIS — E039 Hypothyroidism, unspecified: Secondary | ICD-10-CM | POA: Diagnosis not present

## 2022-10-25 DIAGNOSIS — Z7982 Long term (current) use of aspirin: Secondary | ICD-10-CM | POA: Diagnosis not present

## 2022-10-25 DIAGNOSIS — I10 Essential (primary) hypertension: Secondary | ICD-10-CM | POA: Diagnosis not present

## 2022-10-25 DIAGNOSIS — K219 Gastro-esophageal reflux disease without esophagitis: Secondary | ICD-10-CM | POA: Diagnosis not present

## 2022-10-25 DIAGNOSIS — Z9181 History of falling: Secondary | ICD-10-CM | POA: Diagnosis not present

## 2022-10-25 DIAGNOSIS — M80052D Age-related osteoporosis with current pathological fracture, left femur, subsequent encounter for fracture with routine healing: Secondary | ICD-10-CM | POA: Diagnosis not present

## 2022-10-25 DIAGNOSIS — Z7901 Long term (current) use of anticoagulants: Secondary | ICD-10-CM | POA: Diagnosis not present

## 2022-10-25 DIAGNOSIS — J302 Other seasonal allergic rhinitis: Secondary | ICD-10-CM | POA: Diagnosis not present

## 2022-10-29 DIAGNOSIS — E039 Hypothyroidism, unspecified: Secondary | ICD-10-CM | POA: Diagnosis not present

## 2022-10-29 DIAGNOSIS — I10 Essential (primary) hypertension: Secondary | ICD-10-CM | POA: Diagnosis not present

## 2022-10-29 DIAGNOSIS — M80052D Age-related osteoporosis with current pathological fracture, left femur, subsequent encounter for fracture with routine healing: Secondary | ICD-10-CM | POA: Diagnosis not present

## 2022-10-29 DIAGNOSIS — J302 Other seasonal allergic rhinitis: Secondary | ICD-10-CM | POA: Diagnosis not present

## 2022-10-29 DIAGNOSIS — Z7982 Long term (current) use of aspirin: Secondary | ICD-10-CM | POA: Diagnosis not present

## 2022-10-29 DIAGNOSIS — K219 Gastro-esophageal reflux disease without esophagitis: Secondary | ICD-10-CM | POA: Diagnosis not present

## 2022-10-29 DIAGNOSIS — Z9181 History of falling: Secondary | ICD-10-CM | POA: Diagnosis not present

## 2022-10-29 DIAGNOSIS — Z7901 Long term (current) use of anticoagulants: Secondary | ICD-10-CM | POA: Diagnosis not present

## 2022-10-31 DIAGNOSIS — N182 Chronic kidney disease, stage 2 (mild): Secondary | ICD-10-CM | POA: Diagnosis not present

## 2022-10-31 DIAGNOSIS — I129 Hypertensive chronic kidney disease with stage 1 through stage 4 chronic kidney disease, or unspecified chronic kidney disease: Secondary | ICD-10-CM | POA: Diagnosis not present

## 2022-10-31 DIAGNOSIS — E039 Hypothyroidism, unspecified: Secondary | ICD-10-CM | POA: Diagnosis not present

## 2022-10-31 DIAGNOSIS — M542 Cervicalgia: Secondary | ICD-10-CM | POA: Diagnosis not present

## 2022-10-31 DIAGNOSIS — R2681 Unsteadiness on feet: Secondary | ICD-10-CM | POA: Diagnosis not present

## 2022-10-31 DIAGNOSIS — E559 Vitamin D deficiency, unspecified: Secondary | ICD-10-CM | POA: Diagnosis not present

## 2022-10-31 DIAGNOSIS — H04129 Dry eye syndrome of unspecified lacrimal gland: Secondary | ICD-10-CM | POA: Diagnosis not present

## 2022-11-02 DIAGNOSIS — M6281 Muscle weakness (generalized): Secondary | ICD-10-CM | POA: Diagnosis not present

## 2022-11-02 DIAGNOSIS — E785 Hyperlipidemia, unspecified: Secondary | ICD-10-CM | POA: Diagnosis not present

## 2022-11-02 DIAGNOSIS — I10 Essential (primary) hypertension: Secondary | ICD-10-CM | POA: Diagnosis not present

## 2022-11-02 DIAGNOSIS — E559 Vitamin D deficiency, unspecified: Secondary | ICD-10-CM | POA: Diagnosis not present

## 2022-11-02 DIAGNOSIS — K219 Gastro-esophageal reflux disease without esophagitis: Secondary | ICD-10-CM | POA: Diagnosis not present

## 2022-11-05 DIAGNOSIS — Z7982 Long term (current) use of aspirin: Secondary | ICD-10-CM | POA: Diagnosis not present

## 2022-11-05 DIAGNOSIS — J302 Other seasonal allergic rhinitis: Secondary | ICD-10-CM | POA: Diagnosis not present

## 2022-11-05 DIAGNOSIS — E039 Hypothyroidism, unspecified: Secondary | ICD-10-CM | POA: Diagnosis not present

## 2022-11-05 DIAGNOSIS — M80052D Age-related osteoporosis with current pathological fracture, left femur, subsequent encounter for fracture with routine healing: Secondary | ICD-10-CM | POA: Diagnosis not present

## 2022-11-05 DIAGNOSIS — I10 Essential (primary) hypertension: Secondary | ICD-10-CM | POA: Diagnosis not present

## 2022-11-05 DIAGNOSIS — Z7901 Long term (current) use of anticoagulants: Secondary | ICD-10-CM | POA: Diagnosis not present

## 2022-11-05 DIAGNOSIS — Z9181 History of falling: Secondary | ICD-10-CM | POA: Diagnosis not present

## 2022-11-05 DIAGNOSIS — K219 Gastro-esophageal reflux disease without esophagitis: Secondary | ICD-10-CM | POA: Diagnosis not present

## 2022-11-07 DIAGNOSIS — I10 Essential (primary) hypertension: Secondary | ICD-10-CM | POA: Diagnosis not present

## 2022-11-07 DIAGNOSIS — Z7982 Long term (current) use of aspirin: Secondary | ICD-10-CM | POA: Diagnosis not present

## 2022-11-07 DIAGNOSIS — Z9181 History of falling: Secondary | ICD-10-CM | POA: Diagnosis not present

## 2022-11-07 DIAGNOSIS — J302 Other seasonal allergic rhinitis: Secondary | ICD-10-CM | POA: Diagnosis not present

## 2022-11-07 DIAGNOSIS — Z7901 Long term (current) use of anticoagulants: Secondary | ICD-10-CM | POA: Diagnosis not present

## 2022-11-07 DIAGNOSIS — K219 Gastro-esophageal reflux disease without esophagitis: Secondary | ICD-10-CM | POA: Diagnosis not present

## 2022-11-07 DIAGNOSIS — M80052D Age-related osteoporosis with current pathological fracture, left femur, subsequent encounter for fracture with routine healing: Secondary | ICD-10-CM | POA: Diagnosis not present

## 2022-11-07 DIAGNOSIS — E039 Hypothyroidism, unspecified: Secondary | ICD-10-CM | POA: Diagnosis not present

## 2022-11-13 DIAGNOSIS — M542 Cervicalgia: Secondary | ICD-10-CM | POA: Diagnosis not present

## 2022-11-13 DIAGNOSIS — G894 Chronic pain syndrome: Secondary | ICD-10-CM | POA: Diagnosis not present

## 2022-11-13 DIAGNOSIS — R269 Unspecified abnormalities of gait and mobility: Secondary | ICD-10-CM | POA: Diagnosis not present

## 2022-11-14 DIAGNOSIS — S72001A Fracture of unspecified part of neck of right femur, initial encounter for closed fracture: Secondary | ICD-10-CM | POA: Diagnosis not present

## 2022-11-21 DIAGNOSIS — Z9181 History of falling: Secondary | ICD-10-CM | POA: Diagnosis not present

## 2022-11-21 DIAGNOSIS — Z7982 Long term (current) use of aspirin: Secondary | ICD-10-CM | POA: Diagnosis not present

## 2022-11-21 DIAGNOSIS — S72001D Fracture of unspecified part of neck of right femur, subsequent encounter for closed fracture with routine healing: Secondary | ICD-10-CM | POA: Diagnosis not present

## 2022-11-21 DIAGNOSIS — Z7901 Long term (current) use of anticoagulants: Secondary | ICD-10-CM | POA: Diagnosis not present

## 2022-11-21 DIAGNOSIS — R2689 Other abnormalities of gait and mobility: Secondary | ICD-10-CM | POA: Diagnosis not present

## 2022-11-21 DIAGNOSIS — E039 Hypothyroidism, unspecified: Secondary | ICD-10-CM | POA: Diagnosis not present

## 2022-11-22 DIAGNOSIS — I7091 Generalized atherosclerosis: Secondary | ICD-10-CM | POA: Diagnosis not present

## 2022-11-22 DIAGNOSIS — B351 Tinea unguium: Secondary | ICD-10-CM | POA: Diagnosis not present

## 2022-11-23 DIAGNOSIS — E440 Moderate protein-calorie malnutrition: Secondary | ICD-10-CM | POA: Diagnosis not present

## 2022-11-23 DIAGNOSIS — G8929 Other chronic pain: Secondary | ICD-10-CM | POA: Diagnosis not present

## 2022-11-26 DIAGNOSIS — Z7901 Long term (current) use of anticoagulants: Secondary | ICD-10-CM | POA: Diagnosis not present

## 2022-11-26 DIAGNOSIS — S72001D Fracture of unspecified part of neck of right femur, subsequent encounter for closed fracture with routine healing: Secondary | ICD-10-CM | POA: Diagnosis not present

## 2022-11-26 DIAGNOSIS — Z7982 Long term (current) use of aspirin: Secondary | ICD-10-CM | POA: Diagnosis not present

## 2022-11-26 DIAGNOSIS — Z9181 History of falling: Secondary | ICD-10-CM | POA: Diagnosis not present

## 2022-11-26 DIAGNOSIS — R2689 Other abnormalities of gait and mobility: Secondary | ICD-10-CM | POA: Diagnosis not present

## 2022-11-26 DIAGNOSIS — E039 Hypothyroidism, unspecified: Secondary | ICD-10-CM | POA: Diagnosis not present

## 2022-11-27 DIAGNOSIS — E440 Moderate protein-calorie malnutrition: Secondary | ICD-10-CM | POA: Diagnosis not present

## 2022-11-27 DIAGNOSIS — N182 Chronic kidney disease, stage 2 (mild): Secondary | ICD-10-CM | POA: Diagnosis not present

## 2022-11-27 DIAGNOSIS — R269 Unspecified abnormalities of gait and mobility: Secondary | ICD-10-CM | POA: Diagnosis not present

## 2022-11-27 DIAGNOSIS — I129 Hypertensive chronic kidney disease with stage 1 through stage 4 chronic kidney disease, or unspecified chronic kidney disease: Secondary | ICD-10-CM | POA: Diagnosis not present

## 2022-11-27 DIAGNOSIS — Z681 Body mass index (BMI) 19 or less, adult: Secondary | ICD-10-CM | POA: Diagnosis not present

## 2022-11-27 DIAGNOSIS — E039 Hypothyroidism, unspecified: Secondary | ICD-10-CM | POA: Diagnosis not present

## 2022-12-03 DIAGNOSIS — Z7901 Long term (current) use of anticoagulants: Secondary | ICD-10-CM | POA: Diagnosis not present

## 2022-12-03 DIAGNOSIS — Z7982 Long term (current) use of aspirin: Secondary | ICD-10-CM | POA: Diagnosis not present

## 2022-12-03 DIAGNOSIS — E039 Hypothyroidism, unspecified: Secondary | ICD-10-CM | POA: Diagnosis not present

## 2022-12-03 DIAGNOSIS — S72001D Fracture of unspecified part of neck of right femur, subsequent encounter for closed fracture with routine healing: Secondary | ICD-10-CM | POA: Diagnosis not present

## 2022-12-03 DIAGNOSIS — R2689 Other abnormalities of gait and mobility: Secondary | ICD-10-CM | POA: Diagnosis not present

## 2022-12-03 DIAGNOSIS — Z9181 History of falling: Secondary | ICD-10-CM | POA: Diagnosis not present

## 2022-12-10 DIAGNOSIS — Z9181 History of falling: Secondary | ICD-10-CM | POA: Diagnosis not present

## 2022-12-10 DIAGNOSIS — Z7901 Long term (current) use of anticoagulants: Secondary | ICD-10-CM | POA: Diagnosis not present

## 2022-12-10 DIAGNOSIS — R2689 Other abnormalities of gait and mobility: Secondary | ICD-10-CM | POA: Diagnosis not present

## 2022-12-10 DIAGNOSIS — S72001D Fracture of unspecified part of neck of right femur, subsequent encounter for closed fracture with routine healing: Secondary | ICD-10-CM | POA: Diagnosis not present

## 2022-12-10 DIAGNOSIS — E039 Hypothyroidism, unspecified: Secondary | ICD-10-CM | POA: Diagnosis not present

## 2022-12-10 DIAGNOSIS — Z7982 Long term (current) use of aspirin: Secondary | ICD-10-CM | POA: Diagnosis not present

## 2022-12-12 DIAGNOSIS — R300 Dysuria: Secondary | ICD-10-CM | POA: Diagnosis not present

## 2022-12-12 DIAGNOSIS — G894 Chronic pain syndrome: Secondary | ICD-10-CM | POA: Diagnosis not present

## 2022-12-12 DIAGNOSIS — M542 Cervicalgia: Secondary | ICD-10-CM | POA: Diagnosis not present

## 2022-12-18 DIAGNOSIS — Z9181 History of falling: Secondary | ICD-10-CM | POA: Diagnosis not present

## 2022-12-18 DIAGNOSIS — Z7901 Long term (current) use of anticoagulants: Secondary | ICD-10-CM | POA: Diagnosis not present

## 2022-12-18 DIAGNOSIS — S72001D Fracture of unspecified part of neck of right femur, subsequent encounter for closed fracture with routine healing: Secondary | ICD-10-CM | POA: Diagnosis not present

## 2022-12-18 DIAGNOSIS — R2689 Other abnormalities of gait and mobility: Secondary | ICD-10-CM | POA: Diagnosis not present

## 2022-12-18 DIAGNOSIS — E039 Hypothyroidism, unspecified: Secondary | ICD-10-CM | POA: Diagnosis not present

## 2022-12-18 DIAGNOSIS — Z7982 Long term (current) use of aspirin: Secondary | ICD-10-CM | POA: Diagnosis not present

## 2022-12-19 DIAGNOSIS — R300 Dysuria: Secondary | ICD-10-CM | POA: Diagnosis not present

## 2022-12-19 DIAGNOSIS — N390 Urinary tract infection, site not specified: Secondary | ICD-10-CM | POA: Diagnosis not present

## 2022-12-21 DIAGNOSIS — H9190 Unspecified hearing loss, unspecified ear: Secondary | ICD-10-CM | POA: Diagnosis not present

## 2022-12-21 DIAGNOSIS — C189 Malignant neoplasm of colon, unspecified: Secondary | ICD-10-CM | POA: Diagnosis not present

## 2022-12-24 DIAGNOSIS — Z9181 History of falling: Secondary | ICD-10-CM | POA: Diagnosis not present

## 2022-12-24 DIAGNOSIS — S72001D Fracture of unspecified part of neck of right femur, subsequent encounter for closed fracture with routine healing: Secondary | ICD-10-CM | POA: Diagnosis not present

## 2022-12-24 DIAGNOSIS — Z7982 Long term (current) use of aspirin: Secondary | ICD-10-CM | POA: Diagnosis not present

## 2022-12-24 DIAGNOSIS — E039 Hypothyroidism, unspecified: Secondary | ICD-10-CM | POA: Diagnosis not present

## 2022-12-24 DIAGNOSIS — Z7901 Long term (current) use of anticoagulants: Secondary | ICD-10-CM | POA: Diagnosis not present

## 2022-12-24 DIAGNOSIS — R2689 Other abnormalities of gait and mobility: Secondary | ICD-10-CM | POA: Diagnosis not present

## 2022-12-26 DIAGNOSIS — Z681 Body mass index (BMI) 19 or less, adult: Secondary | ICD-10-CM | POA: Diagnosis not present

## 2022-12-26 DIAGNOSIS — R269 Unspecified abnormalities of gait and mobility: Secondary | ICD-10-CM | POA: Diagnosis not present

## 2022-12-26 DIAGNOSIS — N390 Urinary tract infection, site not specified: Secondary | ICD-10-CM | POA: Diagnosis not present

## 2022-12-26 DIAGNOSIS — E440 Moderate protein-calorie malnutrition: Secondary | ICD-10-CM | POA: Diagnosis not present

## 2022-12-26 DIAGNOSIS — N182 Chronic kidney disease, stage 2 (mild): Secondary | ICD-10-CM | POA: Diagnosis not present

## 2022-12-26 DIAGNOSIS — I129 Hypertensive chronic kidney disease with stage 1 through stage 4 chronic kidney disease, or unspecified chronic kidney disease: Secondary | ICD-10-CM | POA: Diagnosis not present

## 2022-12-26 DIAGNOSIS — E039 Hypothyroidism, unspecified: Secondary | ICD-10-CM | POA: Diagnosis not present

## 2022-12-31 DIAGNOSIS — S72001D Fracture of unspecified part of neck of right femur, subsequent encounter for closed fracture with routine healing: Secondary | ICD-10-CM | POA: Diagnosis not present

## 2022-12-31 DIAGNOSIS — R2689 Other abnormalities of gait and mobility: Secondary | ICD-10-CM | POA: Diagnosis not present

## 2022-12-31 DIAGNOSIS — Z7901 Long term (current) use of anticoagulants: Secondary | ICD-10-CM | POA: Diagnosis not present

## 2022-12-31 DIAGNOSIS — Z9181 History of falling: Secondary | ICD-10-CM | POA: Diagnosis not present

## 2022-12-31 DIAGNOSIS — Z7982 Long term (current) use of aspirin: Secondary | ICD-10-CM | POA: Diagnosis not present

## 2022-12-31 DIAGNOSIS — E039 Hypothyroidism, unspecified: Secondary | ICD-10-CM | POA: Diagnosis not present

## 2023-01-07 DIAGNOSIS — Z9181 History of falling: Secondary | ICD-10-CM | POA: Diagnosis not present

## 2023-01-07 DIAGNOSIS — E039 Hypothyroidism, unspecified: Secondary | ICD-10-CM | POA: Diagnosis not present

## 2023-01-07 DIAGNOSIS — R2689 Other abnormalities of gait and mobility: Secondary | ICD-10-CM | POA: Diagnosis not present

## 2023-01-07 DIAGNOSIS — Z7901 Long term (current) use of anticoagulants: Secondary | ICD-10-CM | POA: Diagnosis not present

## 2023-01-07 DIAGNOSIS — S72001D Fracture of unspecified part of neck of right femur, subsequent encounter for closed fracture with routine healing: Secondary | ICD-10-CM | POA: Diagnosis not present

## 2023-01-07 DIAGNOSIS — Z7982 Long term (current) use of aspirin: Secondary | ICD-10-CM | POA: Diagnosis not present

## 2023-01-09 DIAGNOSIS — M542 Cervicalgia: Secondary | ICD-10-CM | POA: Diagnosis not present

## 2023-01-09 DIAGNOSIS — G894 Chronic pain syndrome: Secondary | ICD-10-CM | POA: Diagnosis not present

## 2023-01-09 DIAGNOSIS — M25551 Pain in right hip: Secondary | ICD-10-CM | POA: Diagnosis not present

## 2023-01-09 DIAGNOSIS — M1611 Unilateral primary osteoarthritis, right hip: Secondary | ICD-10-CM | POA: Diagnosis not present

## 2023-01-09 DIAGNOSIS — R269 Unspecified abnormalities of gait and mobility: Secondary | ICD-10-CM | POA: Diagnosis not present

## 2023-01-10 ENCOUNTER — Emergency Department
Admission: EM | Admit: 2023-01-10 | Discharge: 2023-01-11 | Disposition: A | Discharge status: Home / Self Care | Payer: Medicare HMO | Attending: Emergency Medicine | Admitting: Emergency Medicine

## 2023-01-10 ENCOUNTER — Encounter: Payer: Self-pay | Admitting: Emergency Medicine

## 2023-01-10 ENCOUNTER — Emergency Department: Payer: Medicare HMO

## 2023-01-10 DIAGNOSIS — I10 Essential (primary) hypertension: Secondary | ICD-10-CM | POA: Diagnosis not present

## 2023-01-10 DIAGNOSIS — S0992XA Unspecified injury of nose, initial encounter: Secondary | ICD-10-CM | POA: Diagnosis present

## 2023-01-10 DIAGNOSIS — S0990XA Unspecified injury of head, initial encounter: Secondary | ICD-10-CM

## 2023-01-10 DIAGNOSIS — S0083XA Contusion of other part of head, initial encounter: Secondary | ICD-10-CM | POA: Insufficient documentation

## 2023-01-10 DIAGNOSIS — G319 Degenerative disease of nervous system, unspecified: Secondary | ICD-10-CM | POA: Diagnosis not present

## 2023-01-10 DIAGNOSIS — R040 Epistaxis: Secondary | ICD-10-CM | POA: Insufficient documentation

## 2023-01-10 DIAGNOSIS — W06XXXA Fall from bed, initial encounter: Secondary | ICD-10-CM | POA: Diagnosis not present

## 2023-01-10 DIAGNOSIS — R58 Hemorrhage, not elsewhere classified: Secondary | ICD-10-CM | POA: Diagnosis not present

## 2023-01-10 DIAGNOSIS — M4312 Spondylolisthesis, cervical region: Secondary | ICD-10-CM | POA: Diagnosis not present

## 2023-01-10 DIAGNOSIS — R220 Localized swelling, mass and lump, head: Secondary | ICD-10-CM | POA: Diagnosis not present

## 2023-01-10 DIAGNOSIS — R7309 Other abnormal glucose: Secondary | ICD-10-CM | POA: Diagnosis not present

## 2023-01-10 DIAGNOSIS — R609 Edema, unspecified: Secondary | ICD-10-CM | POA: Diagnosis not present

## 2023-01-10 DIAGNOSIS — S022XXA Fracture of nasal bones, initial encounter for closed fracture: Secondary | ICD-10-CM | POA: Insufficient documentation

## 2023-01-10 DIAGNOSIS — R9431 Abnormal electrocardiogram [ECG] [EKG]: Secondary | ICD-10-CM | POA: Diagnosis not present

## 2023-01-10 DIAGNOSIS — S0291XA Unspecified fracture of skull, initial encounter for closed fracture: Secondary | ICD-10-CM | POA: Diagnosis not present

## 2023-01-10 LAB — BASIC METABOLIC PANEL
Anion gap: 12 (ref 5–15)
BUN: 19 mg/dL (ref 8–23)
CO2: 27 mmol/L (ref 22–32)
Calcium: 9.5 mg/dL (ref 8.9–10.3)
Chloride: 96 mmol/L — ABNORMAL LOW (ref 98–111)
Creatinine, Ser: 0.8 mg/dL (ref 0.44–1.00)
GFR, Estimated: >60 mL/min (ref >60)
Glucose, Bld: 164 mg/dL — ABNORMAL HIGH (ref 70–99)
Potassium: 4.3 mmol/L (ref 3.5–5.1)
Sodium: 135 mmol/L (ref 135–145)

## 2023-01-10 LAB — CBC
HCT: 38.4 % (ref 36.0–46.0)
Hemoglobin: 12.3 g/dL (ref 12.0–15.0)
MCH: 31.1 pg (ref 26.0–34.0)
MCHC: 32 g/dL (ref 30.0–36.0)
MCV: 97.2 fL (ref 80.0–100.0)
Platelets: 276 10*3/uL (ref 150–400)
RBC: 3.95 MIL/uL (ref 3.87–5.11)
RDW: 13.4 % (ref 11.5–15.5)
WBC: 10 10*3/uL (ref 4.0–10.5)
nRBC: 0 % (ref 0.0–0.2)

## 2023-01-10 NOTE — ED Triage Notes (Addendum)
Pt arrived via [facility] from assisted living facility, [facility], post unwitnessed fall. Unknown LOC. Pt with obvious bruising, swelling, and deformity to the nose. Actively bleeding from the left nares. Nasal clamp applied to assist in stopping bleeding. Pt provided with suction to release oral secretions into. Hematoma, swelling and bruising noted to forehead and frontal region of head. Pt A&O to self only. EDP at bedside.  **Pt takes Eliquis

## 2023-01-10 NOTE — ED Provider Notes (Signed)
[facility] Provider Note    Event Date/Time   First MD Initiated Contact with Patient 01/10/23 2307     (approximate)   History   Fall   HPI  Joanne Gomez is a 89 y.o. female problem list includes history of near syncope, hypertension, elevated troponins, DVT, prior right hip fracture, history of frequent falls.  Medication record that accompanies the patient indicates she takes Eliquis  Prior discharge summary does not make mention of a specific DVT but does report patient on DVT prophylaxis as of February 20, 2022  Patient was found by care home staff next to her bed.  They believe that she rolled out of her fell out of bed.  Patient reports she is not sure what happened.  She is having some nosebleeding.  Believes she fell on her face       Physical Exam   Triage Vital Signs: ED Triage Vitals [01/10/23 2303]  Encounter Vitals Group     BP      Systolic BP Percentile      Diastolic BP Percentile      Pulse      Resp      Temp      Temp src      SpO2 99 %     Weight      Height      Head Circumference      Peak Flow      Pain Score      Pain Loc      Pain Education      Exclude from Growth Chart     Most recent vital signs: Vitals:   01/11/23 0315 01/11/23 0326  BP:    Pulse: 85   Resp: 16   Temp:  98 F (36.7 C)  SpO2: 98%      General: Awake, no distress.  Normocephalic atraumatic set for a small hematoma with abrasion over the left forehead, also noted is some swelling and bruising over the left nasal bone.  There is mild left-sided epistaxis which stops with placement of pressure.  No intraoral injury.  Full range of motion the neck with no cervical tenderness.  She is alert oriented to self and being in a hospital know she lives at a care home but cannot remember the name of it Mild epistaxis from the left nare only.  Right nare is clear.  There is no obvious septal hematoma.  Nasal clamp left in place CV:  Good  peripheral perfusion.  Normal tones and rate.  Patient denies chest pain Resp:  Normal effort.  Clear lungs bilaterally.  Normal work of breathing.  Patient denies any shortness of breath Abd:  No distention.  Soft nontender nondistended.  Patient denies any abdominal or back pain Other:  Moves all extremities well.  5 out of 5 strength arms legs hips and all major joints without noted deformity or injury.  Patient denies any pain or injury in the extremities   ED Results / Procedures / Treatments   Labs (all labs ordered are listed, but only abnormal results are displayed) Labs Reviewed  BASIC METABOLIC PANEL - Abnormal; Notable for the following components:      Result Value   Chloride 96 (*)    Glucose, Bld 164 (*)    All other components within normal limits  CBC  CBG MONITORING, ED     EKG  And interpreted by me at 2320 heart rate 80 QRS 80 QTc  460 Normal sinus rhythm occasional PVC.  No evidence of acute ischemia   RADIOLOGY  CT HEAD WO CONTRAST  Result Date: 01/11/2023 CLINICAL DATA:  Initial evaluation for acute trauma. EXAM: CT HEAD WITHOUT CONTRAST CT MAXILLOFACIAL WITHOUT CONTRAST CT CERVICAL SPINE WITHOUT CONTRAST TECHNIQUE: Multidetector CT imaging of the head, cervical spine, and maxillofacial structures were performed using the standard protocol without intravenous contrast. Multiplanar CT image reconstructions of the cervical spine and maxillofacial structures were also generated. RADIATION DOSE REDUCTION: This exam was performed according to the departmental dose-optimization program which includes automated exposure control, adjustment of the mA and/or kV according to patient size and/or use of iterative reconstruction technique. COMPARISON:  Prior study from 12/28/2020. FINDINGS: CT HEAD FINDINGS Brain: Age-related cerebral atrophy with moderate chronic microvascular ischemic disease. No acute intracranial hemorrhage. No acute large vessel territory infarct. No mass  lesion or midline shift. No hydrocephalus or extra-axial fluid collection. Vascular: No abnormal hyperdense vessel. Scattered calcified atherosclerosis about the skull base. Intracranial circulation somewhat dolichoectatic. Skull: Small soft tissue contusion/laceration present at the left frontal scalp. Calvarium intact without fracture. Other: Mastoid air cells are clear. CT MAXILLOFACIAL FINDINGS Osseous: Zygomatic arches intact. No acute maxillary fracture. Pterygoid plates intact. There are acute minimally displaced bilateral nasal bone fractures. Fracture with angulation of the anterior nasal septum present. Mandible intact. Mandibular condyles normally situated. No acute abnormality about the dentition. Orbits: Globes and orbital soft tissues within normal limits. Bony orbits intact. No retro-orbital hematoma. Prior bilateral ocular lens replacement. Sinuses: Trace layering secretions within the left sphenoid sinus. Paranasal sinuses are otherwise largely clear. Soft tissues: Soft tissue contusion/laceration present at the left frontal scalp. Soft tissue swelling about the nose. CT CERVICAL SPINE FINDINGS Examination degraded by motion artifact. Alignment: Straightening with mild reversal of the normal cervical lordosis. 2 mm degenerative anterolisthesis of C4 on C5. Skull base and vertebrae: Skull base intact. Normal C1-2 articulations are preserved in the dens is intact. Vertebral body height maintained. No acute fracture. No worrisome osseous lesions. Soft tissues and spinal canal: No visible soft tissue abnormality. No abnormal prevertebral edema. Spinal canal grossly within normal limits. Disc levels: Moderate spondylosis at C5-6 and C6-7 without high-grade spinal stenosis. Upper chest: No acute finding. Other: None. IMPRESSION: CT HEAD: 1. No acute intracranial abnormality. 2. Small soft tissue contusion/laceration at the left frontal scalp. No calvarial fracture. 3. Age-related cerebral atrophy with  moderate chronic microvascular ischemic disease. CT MAXILLOFACIAL: 1. Acute minimally displaced bilateral nasal bone fractures. 2. Fracture with angulation of the anterior nasal septum. 3. No other acute maxillofacial injury. CT CERVICAL SPINE: 1. No acute traumatic injury within the cervical spine. 2. Moderate spondylosis at C5-6 and C6-7 without high-grade spinal stenosis. Electronically Signed   By: Rise Mu M.D.   On: 01/11/2023 00:46   CT Cervical Spine Wo Contrast  Result Date: 01/11/2023 CLINICAL DATA:  Initial evaluation for acute trauma. EXAM: CT HEAD WITHOUT CONTRAST CT MAXILLOFACIAL WITHOUT CONTRAST CT CERVICAL SPINE WITHOUT CONTRAST TECHNIQUE: Multidetector CT imaging of the head, cervical spine, and maxillofacial structures were performed using the standard protocol without intravenous contrast. Multiplanar CT image reconstructions of the cervical spine and maxillofacial structures were also generated. RADIATION DOSE REDUCTION: This exam was performed according to the departmental dose-optimization program which includes automated exposure control, adjustment of the mA and/or kV according to patient size and/or use of iterative reconstruction technique. COMPARISON:  Prior study from 12/28/2020. FINDINGS: CT HEAD FINDINGS Brain: Age-related cerebral atrophy with moderate chronic microvascular ischemic  disease. No acute intracranial hemorrhage. No acute large vessel territory infarct. No mass lesion or midline shift. No hydrocephalus or extra-axial fluid collection. Vascular: No abnormal hyperdense vessel. Scattered calcified atherosclerosis about the skull base. Intracranial circulation somewhat dolichoectatic. Skull: Small soft tissue contusion/laceration present at the left frontal scalp. Calvarium intact without fracture. Other: Mastoid air cells are clear. CT MAXILLOFACIAL FINDINGS Osseous: Zygomatic arches intact. No acute maxillary fracture. Pterygoid plates intact. There are acute  minimally displaced bilateral nasal bone fractures. Fracture with angulation of the anterior nasal septum present. Mandible intact. Mandibular condyles normally situated. No acute abnormality about the dentition. Orbits: Globes and orbital soft tissues within normal limits. Bony orbits intact. No retro-orbital hematoma. Prior bilateral ocular lens replacement. Sinuses: Trace layering secretions within the left sphenoid sinus. Paranasal sinuses are otherwise largely clear. Soft tissues: Soft tissue contusion/laceration present at the left frontal scalp. Soft tissue swelling about the nose. CT CERVICAL SPINE FINDINGS Examination degraded by motion artifact. Alignment: Straightening with mild reversal of the normal cervical lordosis. 2 mm degenerative anterolisthesis of C4 on C5. Skull base and vertebrae: Skull base intact. Normal C1-2 articulations are preserved in the dens is intact. Vertebral body height maintained. No acute fracture. No worrisome osseous lesions. Soft tissues and spinal canal: No visible soft tissue abnormality. No abnormal prevertebral edema. Spinal canal grossly within normal limits. Disc levels: Moderate spondylosis at C5-6 and C6-7 without high-grade spinal stenosis. Upper chest: No acute finding. Other: None. IMPRESSION: CT HEAD: 1. No acute intracranial abnormality. 2. Small soft tissue contusion/laceration at the left frontal scalp. No calvarial fracture. 3. Age-related cerebral atrophy with moderate chronic microvascular ischemic disease. CT MAXILLOFACIAL: 1. Acute minimally displaced bilateral nasal bone fractures. 2. Fracture with angulation of the anterior nasal septum. 3. No other acute maxillofacial injury. CT CERVICAL SPINE: 1. No acute traumatic injury within the cervical spine. 2. Moderate spondylosis at C5-6 and C6-7 without high-grade spinal stenosis. Electronically Signed   By: Rise Mu M.D.   On: 01/11/2023 00:46   CT Maxillofacial Wo Contrast  Result Date:  01/11/2023 CLINICAL DATA:  Initial evaluation for acute trauma. EXAM: CT HEAD WITHOUT CONTRAST CT MAXILLOFACIAL WITHOUT CONTRAST CT CERVICAL SPINE WITHOUT CONTRAST TECHNIQUE: Multidetector CT imaging of the head, cervical spine, and maxillofacial structures were performed using the standard protocol without intravenous contrast. Multiplanar CT image reconstructions of the cervical spine and maxillofacial structures were also generated. RADIATION DOSE REDUCTION: This exam was performed according to the departmental dose-optimization program which includes automated exposure control, adjustment of the mA and/or kV according to patient size and/or use of iterative reconstruction technique. COMPARISON:  Prior study from 12/28/2020. FINDINGS: CT HEAD FINDINGS Brain: Age-related cerebral atrophy with moderate chronic microvascular ischemic disease. No acute intracranial hemorrhage. No acute large vessel territory infarct. No mass lesion or midline shift. No hydrocephalus or extra-axial fluid collection. Vascular: No abnormal hyperdense vessel. Scattered calcified atherosclerosis about the skull base. Intracranial circulation somewhat dolichoectatic. Skull: Small soft tissue contusion/laceration present at the left frontal scalp. Calvarium intact without fracture. Other: Mastoid air cells are clear. CT MAXILLOFACIAL FINDINGS Osseous: Zygomatic arches intact. No acute maxillary fracture. Pterygoid plates intact. There are acute minimally displaced bilateral nasal bone fractures. Fracture with angulation of the anterior nasal septum present. Mandible intact. Mandibular condyles normally situated. No acute abnormality about the dentition. Orbits: Globes and orbital soft tissues within normal limits. Bony orbits intact. No retro-orbital hematoma. Prior bilateral ocular lens replacement. Sinuses: Trace layering secretions within the left sphenoid sinus. Paranasal sinuses  are otherwise largely clear. Soft tissues: Soft tissue  contusion/laceration present at the left frontal scalp. Soft tissue swelling about the nose. CT CERVICAL SPINE FINDINGS Examination degraded by motion artifact. Alignment: Straightening with mild reversal of the normal cervical lordosis. 2 mm degenerative anterolisthesis of C4 on C5. Skull base and vertebrae: Skull base intact. Normal C1-2 articulations are preserved in the dens is intact. Vertebral body height maintained. No acute fracture. No worrisome osseous lesions. Soft tissues and spinal canal: No visible soft tissue abnormality. No abnormal prevertebral edema. Spinal canal grossly within normal limits. Disc levels: Moderate spondylosis at C5-6 and C6-7 without high-grade spinal stenosis. Upper chest: No acute finding. Other: None. IMPRESSION: CT HEAD: 1. No acute intracranial abnormality. 2. Small soft tissue contusion/laceration at the left frontal scalp. No calvarial fracture. 3. Age-related cerebral atrophy with moderate chronic microvascular ischemic disease. CT MAXILLOFACIAL: 1. Acute minimally displaced bilateral nasal bone fractures. 2. Fracture with angulation of the anterior nasal septum. 3. No other acute maxillofacial injury. CT CERVICAL SPINE: 1. No acute traumatic injury within the cervical spine. 2. Moderate spondylosis at C5-6 and C6-7 without high-grade spinal stenosis. Electronically Signed   By: Rise Mu M.D.   On: 01/11/2023 00:46      PROCEDURES:  Critical Care performed: No  Procedures   MEDICATIONS ORDERED IN ED: Medications - No data to display   IMPRESSION / MDM / ASSESSMENT AND PLAN / ED COURSE  I reviewed the triage vital signs and the nursing notes.                              Differential diagnosis includes, but is not limited to, injuries suffered from a fall possibly out of bed, denies any associated other symptoms no chest pain no shortness of breath.  Denies any concerns of preceding illness.  She is fairly well-oriented but does not recall the  exact year.  She is otherwise alert well-appearing but has obvious what appears to be mild injuries suffered from fall, but will require CT scan of the head to evaluate for further causes and injury.  Also given the patient's slight confusion, potentially of her baseline per report of EMS, will also obtain CT of the neck.  No cardiac symptoms.   With no signs or symptoms of infectious illness  CBC normal.   Patient's presentation is most consistent with acute complicated illness / injury requiring diagnostic workup.   ----------------------------------------- 3:33 AM on 01/11/2023 ----------------------------------------- Nasal clamp has been removed, patient reports no further bleeding from her nose or to the back of the mouth.  Resting comfortably.  Hemostasis has been achieved.  Discussed careful return precautions treatment recommendations follow-up for nasal fracture as well as head injury precautions with patient and daughter at the bedside  Return precautions and treatment recommendations and follow-up discussed with the patient who is agreeable with the plan.        FINAL CLINICAL IMPRESSION(S) / ED DIAGNOSES   Final diagnoses:  Left-sided epistaxis  Injury of head, initial encounter  Closed fracture of nasal bone, initial encounter     Rx / DC Orders   ED Discharge Orders     None        Note:  This document was prepared using Dragon voice recognition software and may include unintentional dictation errors.   Sharyn Creamer, MD 01/11/23 [phone]

## 2023-01-11 NOTE — ED Notes (Signed)
Attempted to call report to [place], no answer. Left with family in personal vehicle.

## 2023-01-15 DIAGNOSIS — R32 Unspecified urinary incontinence: Secondary | ICD-10-CM | POA: Diagnosis not present

## 2023-01-15 DIAGNOSIS — R269 Unspecified abnormalities of gait and mobility: Secondary | ICD-10-CM | POA: Diagnosis not present

## 2023-01-15 DIAGNOSIS — Z9181 History of falling: Secondary | ICD-10-CM | POA: Diagnosis not present

## 2023-01-15 DIAGNOSIS — R2689 Other abnormalities of gait and mobility: Secondary | ICD-10-CM | POA: Diagnosis not present

## 2023-01-15 DIAGNOSIS — E039 Hypothyroidism, unspecified: Secondary | ICD-10-CM | POA: Diagnosis not present

## 2023-01-15 DIAGNOSIS — Z7901 Long term (current) use of anticoagulants: Secondary | ICD-10-CM | POA: Diagnosis not present

## 2023-01-15 DIAGNOSIS — W1830XA Fall on same level, unspecified, initial encounter: Secondary | ICD-10-CM | POA: Diagnosis not present

## 2023-01-15 DIAGNOSIS — R040 Epistaxis: Secondary | ICD-10-CM | POA: Diagnosis not present

## 2023-01-15 DIAGNOSIS — S022XXA Fracture of nasal bones, initial encounter for closed fracture: Secondary | ICD-10-CM | POA: Diagnosis not present

## 2023-01-15 DIAGNOSIS — S72001D Fracture of unspecified part of neck of right femur, subsequent encounter for closed fracture with routine healing: Secondary | ICD-10-CM | POA: Diagnosis not present

## 2023-01-17 ENCOUNTER — Telehealth: Payer: Self-pay

## 2023-01-17 NOTE — Telephone Encounter (Signed)
Transition Care Management Unsuccessful Follow-up Telephone Call  Date of discharge and from where:  [facility] 8/16  Attempts:  1st Attempt  Reason for unsuccessful TCM follow-up call:  No answer/busy   [facility]  [facility] Guide, Phone: [phone] Website: [email or web address]

## 2023-01-18 ENCOUNTER — Telehealth: Payer: Self-pay

## 2023-01-18 DIAGNOSIS — R0781 Pleurodynia: Secondary | ICD-10-CM | POA: Diagnosis not present

## 2023-01-18 NOTE — Telephone Encounter (Signed)
Transition Care Management Follow-up Telephone Call Date of discharge and from where: [facility] 8/16 How have you been since you were released from the hospital? Doing well but a little sore PT is in ALF Any questions or concerns? No  Items Reviewed: Did the pt receive and understand the discharge instructions provided? Yes  Medications obtained and verified? No  Other? No  Any new allergies since your discharge? No  Dietary orders reviewed? No Do you have support at home? Yes  ALF   Follow up appointments reviewed:  PCP Hospital f/u appt confirmed? Yes  Scheduled to see  on  @ . Specialist Hospital f/u appt confirmed? No  Scheduled to see  on  @ . Are transportation arrangements needed? No  If their condition worsens, is the pt aware to call PCP or go to the Emergency Dept.? Yes Was the patient provided with contact information for the PCP's office or ED? Yes Was to pt encouraged to call back with questions or concerns? Yes

## 2023-01-21 DIAGNOSIS — E039 Hypothyroidism, unspecified: Secondary | ICD-10-CM | POA: Diagnosis not present

## 2023-01-21 DIAGNOSIS — R2689 Other abnormalities of gait and mobility: Secondary | ICD-10-CM | POA: Diagnosis not present

## 2023-01-21 DIAGNOSIS — Z7901 Long term (current) use of anticoagulants: Secondary | ICD-10-CM | POA: Diagnosis not present

## 2023-01-21 DIAGNOSIS — Z9181 History of falling: Secondary | ICD-10-CM | POA: Diagnosis not present

## 2023-01-21 DIAGNOSIS — S72001D Fracture of unspecified part of neck of right femur, subsequent encounter for closed fracture with routine healing: Secondary | ICD-10-CM | POA: Diagnosis not present

## 2023-01-23 DIAGNOSIS — Z7901 Long term (current) use of anticoagulants: Secondary | ICD-10-CM | POA: Diagnosis not present

## 2023-01-23 DIAGNOSIS — E039 Hypothyroidism, unspecified: Secondary | ICD-10-CM | POA: Diagnosis not present

## 2023-01-23 DIAGNOSIS — R2689 Other abnormalities of gait and mobility: Secondary | ICD-10-CM | POA: Diagnosis not present

## 2023-01-23 DIAGNOSIS — E440 Moderate protein-calorie malnutrition: Secondary | ICD-10-CM | POA: Diagnosis not present

## 2023-01-23 DIAGNOSIS — S72001D Fracture of unspecified part of neck of right femur, subsequent encounter for closed fracture with routine healing: Secondary | ICD-10-CM | POA: Diagnosis not present

## 2023-01-23 DIAGNOSIS — I129 Hypertensive chronic kidney disease with stage 1 through stage 4 chronic kidney disease, or unspecified chronic kidney disease: Secondary | ICD-10-CM | POA: Diagnosis not present

## 2023-01-23 DIAGNOSIS — N182 Chronic kidney disease, stage 2 (mild): Secondary | ICD-10-CM | POA: Diagnosis not present

## 2023-01-23 DIAGNOSIS — M545 Low back pain, unspecified: Secondary | ICD-10-CM | POA: Diagnosis not present

## 2023-01-23 DIAGNOSIS — Z9181 History of falling: Secondary | ICD-10-CM | POA: Diagnosis not present

## 2023-01-23 DIAGNOSIS — S022XXD Fracture of nasal bones, subsequent encounter for fracture with routine healing: Secondary | ICD-10-CM | POA: Diagnosis not present

## 2023-01-23 DIAGNOSIS — R269 Unspecified abnormalities of gait and mobility: Secondary | ICD-10-CM | POA: Diagnosis not present

## 2023-01-24 DIAGNOSIS — I7091 Generalized atherosclerosis: Secondary | ICD-10-CM | POA: Diagnosis not present

## 2023-01-24 DIAGNOSIS — B351 Tinea unguium: Secondary | ICD-10-CM | POA: Diagnosis not present

## 2023-01-25 DIAGNOSIS — H9190 Unspecified hearing loss, unspecified ear: Secondary | ICD-10-CM | POA: Diagnosis not present

## 2023-01-25 DIAGNOSIS — S32049D Unspecified fracture of fourth lumbar vertebra, subsequent encounter for fracture with routine healing: Secondary | ICD-10-CM | POA: Diagnosis not present

## 2023-01-25 DIAGNOSIS — E785 Hyperlipidemia, unspecified: Secondary | ICD-10-CM | POA: Diagnosis not present

## 2023-01-25 DIAGNOSIS — M48061 Spinal stenosis, lumbar region without neurogenic claudication: Secondary | ICD-10-CM | POA: Diagnosis not present

## 2023-01-25 DIAGNOSIS — C189 Malignant neoplasm of colon, unspecified: Secondary | ICD-10-CM | POA: Diagnosis not present

## 2023-01-25 DIAGNOSIS — M6281 Muscle weakness (generalized): Secondary | ICD-10-CM | POA: Diagnosis not present

## 2023-01-25 DIAGNOSIS — E559 Vitamin D deficiency, unspecified: Secondary | ICD-10-CM | POA: Diagnosis not present

## 2023-01-25 DIAGNOSIS — I10 Essential (primary) hypertension: Secondary | ICD-10-CM | POA: Diagnosis not present

## 2023-01-25 DIAGNOSIS — K219 Gastro-esophageal reflux disease without esophagitis: Secondary | ICD-10-CM | POA: Diagnosis not present

## 2023-01-28 DIAGNOSIS — Z9181 History of falling: Secondary | ICD-10-CM | POA: Diagnosis not present

## 2023-01-28 DIAGNOSIS — S72001D Fracture of unspecified part of neck of right femur, subsequent encounter for closed fracture with routine healing: Secondary | ICD-10-CM | POA: Diagnosis not present

## 2023-01-28 DIAGNOSIS — R2689 Other abnormalities of gait and mobility: Secondary | ICD-10-CM | POA: Diagnosis not present

## 2023-01-28 DIAGNOSIS — Z7901 Long term (current) use of anticoagulants: Secondary | ICD-10-CM | POA: Diagnosis not present

## 2023-01-28 DIAGNOSIS — E039 Hypothyroidism, unspecified: Secondary | ICD-10-CM | POA: Diagnosis not present

## 2023-02-04 DIAGNOSIS — R2689 Other abnormalities of gait and mobility: Secondary | ICD-10-CM | POA: Diagnosis not present

## 2023-02-04 DIAGNOSIS — Z9181 History of falling: Secondary | ICD-10-CM | POA: Diagnosis not present

## 2023-02-04 DIAGNOSIS — Z7901 Long term (current) use of anticoagulants: Secondary | ICD-10-CM | POA: Diagnosis not present

## 2023-02-04 DIAGNOSIS — S72001D Fracture of unspecified part of neck of right femur, subsequent encounter for closed fracture with routine healing: Secondary | ICD-10-CM | POA: Diagnosis not present

## 2023-02-04 DIAGNOSIS — E039 Hypothyroidism, unspecified: Secondary | ICD-10-CM | POA: Diagnosis not present

## 2023-02-05 DIAGNOSIS — M542 Cervicalgia: Secondary | ICD-10-CM | POA: Diagnosis not present

## 2023-02-05 DIAGNOSIS — S72141A Displaced intertrochanteric fracture of right femur, initial encounter for closed fracture: Secondary | ICD-10-CM | POA: Diagnosis not present

## 2023-02-05 DIAGNOSIS — S93402A Sprain of unspecified ligament of left ankle, initial encounter: Secondary | ICD-10-CM | POA: Diagnosis not present

## 2023-02-06 DIAGNOSIS — R2689 Other abnormalities of gait and mobility: Secondary | ICD-10-CM | POA: Diagnosis not present

## 2023-02-06 DIAGNOSIS — Z7901 Long term (current) use of anticoagulants: Secondary | ICD-10-CM | POA: Diagnosis not present

## 2023-02-06 DIAGNOSIS — G894 Chronic pain syndrome: Secondary | ICD-10-CM | POA: Diagnosis not present

## 2023-02-06 DIAGNOSIS — E039 Hypothyroidism, unspecified: Secondary | ICD-10-CM | POA: Diagnosis not present

## 2023-02-06 DIAGNOSIS — S72001D Fracture of unspecified part of neck of right femur, subsequent encounter for closed fracture with routine healing: Secondary | ICD-10-CM | POA: Diagnosis not present

## 2023-02-06 DIAGNOSIS — M542 Cervicalgia: Secondary | ICD-10-CM | POA: Diagnosis not present

## 2023-02-06 DIAGNOSIS — M549 Dorsalgia, unspecified: Secondary | ICD-10-CM | POA: Diagnosis not present

## 2023-02-06 DIAGNOSIS — Z9181 History of falling: Secondary | ICD-10-CM | POA: Diagnosis not present

## 2023-02-06 DIAGNOSIS — R2681 Unsteadiness on feet: Secondary | ICD-10-CM | POA: Diagnosis not present

## 2023-02-12 DIAGNOSIS — R2689 Other abnormalities of gait and mobility: Secondary | ICD-10-CM | POA: Diagnosis not present

## 2023-02-12 DIAGNOSIS — Z7901 Long term (current) use of anticoagulants: Secondary | ICD-10-CM | POA: Diagnosis not present

## 2023-02-12 DIAGNOSIS — S72001D Fracture of unspecified part of neck of right femur, subsequent encounter for closed fracture with routine healing: Secondary | ICD-10-CM | POA: Diagnosis not present

## 2023-02-12 DIAGNOSIS — E039 Hypothyroidism, unspecified: Secondary | ICD-10-CM | POA: Diagnosis not present

## 2023-02-12 DIAGNOSIS — Z9181 History of falling: Secondary | ICD-10-CM | POA: Diagnosis not present

## 2023-02-20 DIAGNOSIS — Z86718 Personal history of other venous thrombosis and embolism: Secondary | ICD-10-CM | POA: Diagnosis not present

## 2023-02-20 DIAGNOSIS — E871 Hypo-osmolality and hyponatremia: Secondary | ICD-10-CM | POA: Diagnosis not present

## 2023-02-20 DIAGNOSIS — I129 Hypertensive chronic kidney disease with stage 1 through stage 4 chronic kidney disease, or unspecified chronic kidney disease: Secondary | ICD-10-CM | POA: Diagnosis not present

## 2023-02-20 DIAGNOSIS — N182 Chronic kidney disease, stage 2 (mild): Secondary | ICD-10-CM | POA: Diagnosis not present

## 2023-02-20 DIAGNOSIS — E039 Hypothyroidism, unspecified: Secondary | ICD-10-CM | POA: Diagnosis not present

## 2023-02-20 DIAGNOSIS — R269 Unspecified abnormalities of gait and mobility: Secondary | ICD-10-CM | POA: Diagnosis not present

## 2023-02-20 DIAGNOSIS — E46 Unspecified protein-calorie malnutrition: Secondary | ICD-10-CM | POA: Diagnosis not present

## 2023-02-20 DIAGNOSIS — I739 Peripheral vascular disease, unspecified: Secondary | ICD-10-CM | POA: Diagnosis not present

## 2023-02-22 DIAGNOSIS — E46 Unspecified protein-calorie malnutrition: Secondary | ICD-10-CM | POA: Diagnosis not present

## 2023-02-22 DIAGNOSIS — C189 Malignant neoplasm of colon, unspecified: Secondary | ICD-10-CM | POA: Diagnosis not present

## 2023-02-28 DIAGNOSIS — M791 Myalgia, unspecified site: Secondary | ICD-10-CM | POA: Diagnosis not present

## 2023-02-28 DIAGNOSIS — M542 Cervicalgia: Secondary | ICD-10-CM | POA: Diagnosis not present

## 2023-02-28 DIAGNOSIS — M4802 Spinal stenosis, cervical region: Secondary | ICD-10-CM | POA: Diagnosis not present

## 2023-02-28 DIAGNOSIS — M4312 Spondylolisthesis, cervical region: Secondary | ICD-10-CM | POA: Diagnosis not present

## 2023-02-28 DIAGNOSIS — M47812 Spondylosis without myelopathy or radiculopathy, cervical region: Secondary | ICD-10-CM | POA: Diagnosis not present

## 2023-03-06 DIAGNOSIS — M159 Polyosteoarthritis, unspecified: Secondary | ICD-10-CM | POA: Diagnosis not present

## 2023-03-06 DIAGNOSIS — M542 Cervicalgia: Secondary | ICD-10-CM | POA: Diagnosis not present

## 2023-03-06 DIAGNOSIS — R2681 Unsteadiness on feet: Secondary | ICD-10-CM | POA: Diagnosis not present

## 2023-03-08 DIAGNOSIS — R2689 Other abnormalities of gait and mobility: Secondary | ICD-10-CM | POA: Diagnosis not present

## 2023-03-08 DIAGNOSIS — S72001D Fracture of unspecified part of neck of right femur, subsequent encounter for closed fracture with routine healing: Secondary | ICD-10-CM | POA: Diagnosis not present

## 2023-03-08 DIAGNOSIS — E039 Hypothyroidism, unspecified: Secondary | ICD-10-CM | POA: Diagnosis not present

## 2023-03-08 DIAGNOSIS — Z9181 History of falling: Secondary | ICD-10-CM | POA: Diagnosis not present

## 2023-03-08 DIAGNOSIS — Z7901 Long term (current) use of anticoagulants: Secondary | ICD-10-CM | POA: Diagnosis not present

## 2023-03-13 DIAGNOSIS — R2689 Other abnormalities of gait and mobility: Secondary | ICD-10-CM | POA: Diagnosis not present

## 2023-03-13 DIAGNOSIS — S72001D Fracture of unspecified part of neck of right femur, subsequent encounter for closed fracture with routine healing: Secondary | ICD-10-CM | POA: Diagnosis not present

## 2023-03-13 DIAGNOSIS — Z9181 History of falling: Secondary | ICD-10-CM | POA: Diagnosis not present

## 2023-03-13 DIAGNOSIS — Z7901 Long term (current) use of anticoagulants: Secondary | ICD-10-CM | POA: Diagnosis not present

## 2023-03-13 DIAGNOSIS — U071 COVID-19: Secondary | ICD-10-CM | POA: Diagnosis not present

## 2023-03-13 DIAGNOSIS — E039 Hypothyroidism, unspecified: Secondary | ICD-10-CM | POA: Diagnosis not present

## 2023-03-13 DIAGNOSIS — R059 Cough, unspecified: Secondary | ICD-10-CM | POA: Diagnosis not present

## 2023-03-18 DIAGNOSIS — S72001D Fracture of unspecified part of neck of right femur, subsequent encounter for closed fracture with routine healing: Secondary | ICD-10-CM | POA: Diagnosis not present

## 2023-03-18 DIAGNOSIS — Z9181 History of falling: Secondary | ICD-10-CM | POA: Diagnosis not present

## 2023-03-18 DIAGNOSIS — Z7901 Long term (current) use of anticoagulants: Secondary | ICD-10-CM | POA: Diagnosis not present

## 2023-03-18 DIAGNOSIS — E039 Hypothyroidism, unspecified: Secondary | ICD-10-CM | POA: Diagnosis not present

## 2023-03-18 DIAGNOSIS — R2689 Other abnormalities of gait and mobility: Secondary | ICD-10-CM | POA: Diagnosis not present

## 2023-03-19 DIAGNOSIS — E46 Unspecified protein-calorie malnutrition: Secondary | ICD-10-CM | POA: Diagnosis not present

## 2023-03-19 DIAGNOSIS — C189 Malignant neoplasm of colon, unspecified: Secondary | ICD-10-CM | POA: Diagnosis not present

## 2023-03-20 DIAGNOSIS — R2681 Unsteadiness on feet: Secondary | ICD-10-CM | POA: Diagnosis not present

## 2023-03-20 DIAGNOSIS — U071 COVID-19: Secondary | ICD-10-CM | POA: Diagnosis not present

## 2023-03-20 DIAGNOSIS — K5900 Constipation, unspecified: Secondary | ICD-10-CM | POA: Diagnosis not present

## 2023-03-20 DIAGNOSIS — H04129 Dry eye syndrome of unspecified lacrimal gland: Secondary | ICD-10-CM | POA: Diagnosis not present

## 2023-03-20 DIAGNOSIS — N182 Chronic kidney disease, stage 2 (mild): Secondary | ICD-10-CM | POA: Diagnosis not present

## 2023-03-20 DIAGNOSIS — I129 Hypertensive chronic kidney disease with stage 1 through stage 4 chronic kidney disease, or unspecified chronic kidney disease: Secondary | ICD-10-CM | POA: Diagnosis not present

## 2023-03-20 DIAGNOSIS — J309 Allergic rhinitis, unspecified: Secondary | ICD-10-CM | POA: Diagnosis not present

## 2023-03-20 DIAGNOSIS — M159 Polyosteoarthritis, unspecified: Secondary | ICD-10-CM | POA: Diagnosis not present

## 2023-03-20 DIAGNOSIS — K219 Gastro-esophageal reflux disease without esophagitis: Secondary | ICD-10-CM | POA: Diagnosis not present

## 2023-04-03 DIAGNOSIS — R269 Unspecified abnormalities of gait and mobility: Secondary | ICD-10-CM | POA: Diagnosis not present

## 2023-04-03 DIAGNOSIS — M159 Polyosteoarthritis, unspecified: Secondary | ICD-10-CM | POA: Diagnosis not present

## 2023-04-03 DIAGNOSIS — M5030 Other cervical disc degeneration, unspecified cervical region: Secondary | ICD-10-CM | POA: Diagnosis not present

## 2023-04-03 DIAGNOSIS — M542 Cervicalgia: Secondary | ICD-10-CM | POA: Diagnosis not present

## 2023-04-15 DIAGNOSIS — E46 Unspecified protein-calorie malnutrition: Secondary | ICD-10-CM | POA: Diagnosis not present

## 2023-04-15 DIAGNOSIS — C189 Malignant neoplasm of colon, unspecified: Secondary | ICD-10-CM | POA: Diagnosis not present

## 2023-04-17 DIAGNOSIS — E440 Moderate protein-calorie malnutrition: Secondary | ICD-10-CM | POA: Diagnosis not present

## 2023-04-17 DIAGNOSIS — I739 Peripheral vascular disease, unspecified: Secondary | ICD-10-CM | POA: Diagnosis not present

## 2023-04-17 DIAGNOSIS — D519 Vitamin B12 deficiency anemia, unspecified: Secondary | ICD-10-CM | POA: Diagnosis not present

## 2023-04-17 DIAGNOSIS — D509 Iron deficiency anemia, unspecified: Secondary | ICD-10-CM | POA: Diagnosis not present

## 2023-04-17 DIAGNOSIS — L89151 Pressure ulcer of sacral region, stage 1: Secondary | ICD-10-CM | POA: Diagnosis not present

## 2023-04-17 DIAGNOSIS — I129 Hypertensive chronic kidney disease with stage 1 through stage 4 chronic kidney disease, or unspecified chronic kidney disease: Secondary | ICD-10-CM | POA: Diagnosis not present

## 2023-04-17 DIAGNOSIS — N182 Chronic kidney disease, stage 2 (mild): Secondary | ICD-10-CM | POA: Diagnosis not present

## 2023-04-17 DIAGNOSIS — Z86718 Personal history of other venous thrombosis and embolism: Secondary | ICD-10-CM | POA: Diagnosis not present

## 2023-04-17 DIAGNOSIS — E559 Vitamin D deficiency, unspecified: Secondary | ICD-10-CM | POA: Diagnosis not present

## 2023-04-19 DIAGNOSIS — E785 Hyperlipidemia, unspecified: Secondary | ICD-10-CM | POA: Diagnosis not present

## 2023-04-19 DIAGNOSIS — I10 Essential (primary) hypertension: Secondary | ICD-10-CM | POA: Diagnosis not present

## 2023-04-19 DIAGNOSIS — K219 Gastro-esophageal reflux disease without esophagitis: Secondary | ICD-10-CM | POA: Diagnosis not present

## 2023-04-19 DIAGNOSIS — E559 Vitamin D deficiency, unspecified: Secondary | ICD-10-CM | POA: Diagnosis not present

## 2023-04-19 DIAGNOSIS — M6281 Muscle weakness (generalized): Secondary | ICD-10-CM | POA: Diagnosis not present

## 2023-04-24 IMAGING — MR MR CERVICAL SPINE W/O CM
5 series · 38 of 48 positions shown · non-contrast
Comparison: Radiography 12/09/2020.

CLINICAL DATA: Chronic neck pain and radicular pain over the last
year.

EXAM:
MRI CERVICAL SPINE WITHOUT CONTRAST
TECHNIQUE: Multiplanar, multisequence MR imaging of the cervical spine was
performed. No intravenous contrast was administered.

[Series 5: T2 · sagittal · 3.0mm · 0.62mm/px · 6 of 15 slices shown (1 of 2)]
[im 1/15]
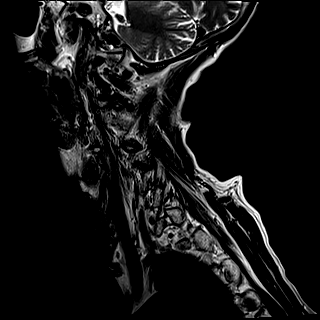
[im 3/15]
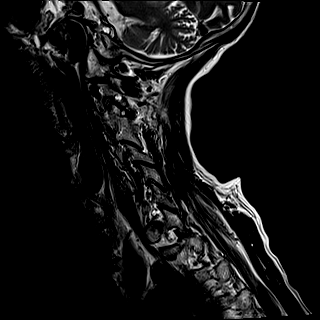
[im 6/15]
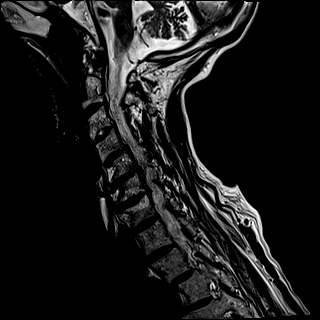
[im 9/15]
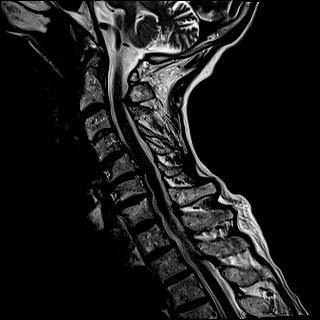
[im 12/15]
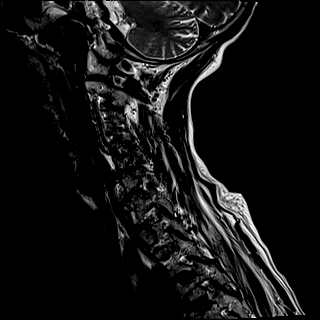
[im 15/15]
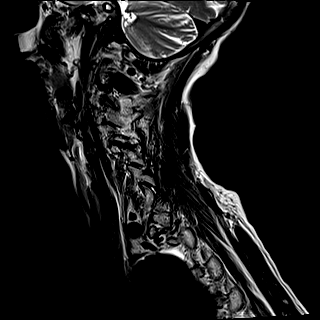

[Series 6: FLAIR · sagittal · 3.0mm · 0.78mm/px · 7 of 15 slices shown]
[im 1/15]
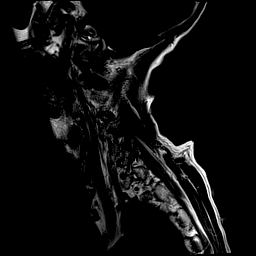
[im 3/15]
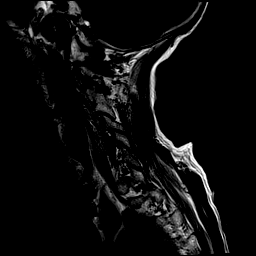
[im 5/15]
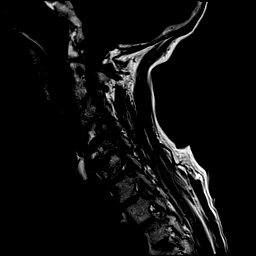
[im 8/15]
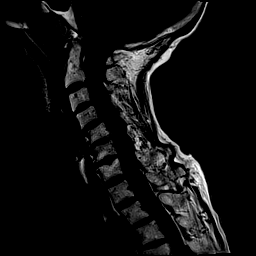
[im 10/15]
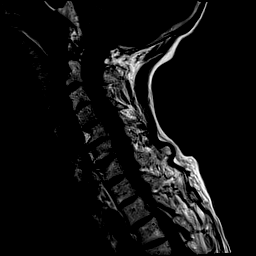
[im 12/15]
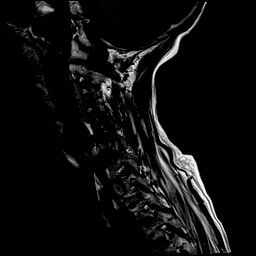
[im 15/15]
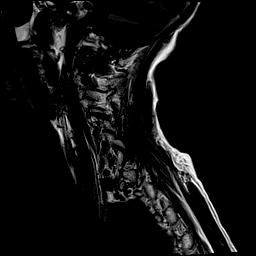

[Series 7: STIR · sagittal · 3.0mm · 0.62mm/px · 7 of 15 slices shown]
[im 1/15]
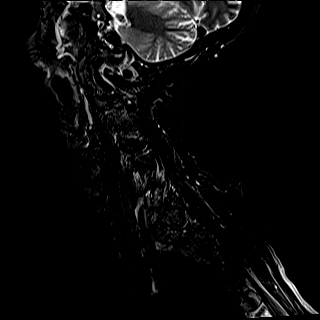
[im 3/15]
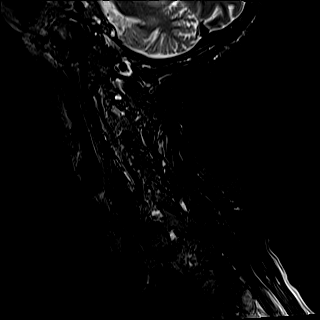
[im 5/15]
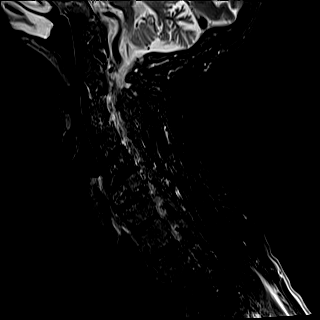
[im 8/15]
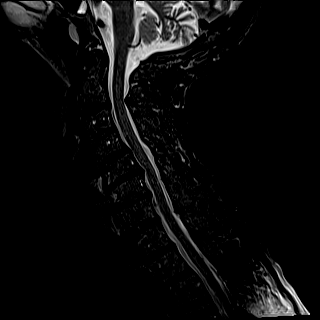
[im 10/15]
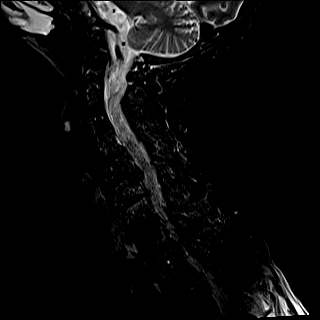
[im 12/15]
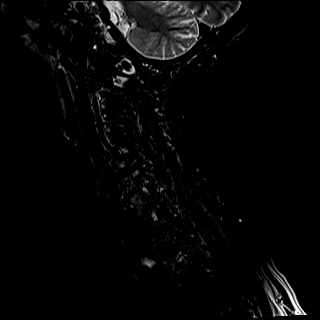
[im 15/15]
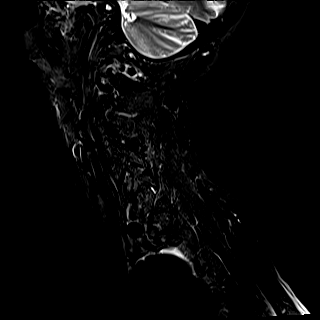

[Series 8: T2 · axial · 3.0mm · 0.70mm/px · z∈[-99,-3]mm · 10 of 29 slices shown (2 of 2)]
[im 1/29]
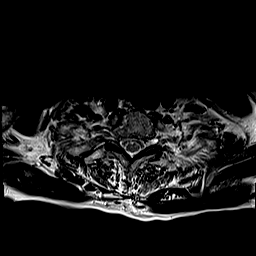
[im 3/29]
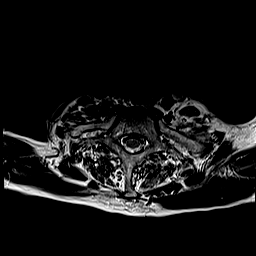
[im 5/29]
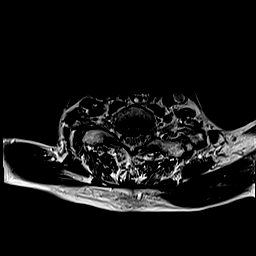
[im 7/29]
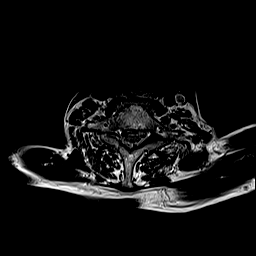
[im 9/29]
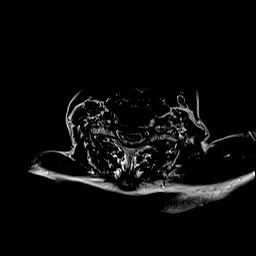
[im 13/29]
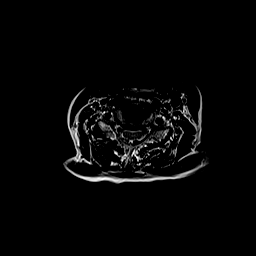
[im 16/29]
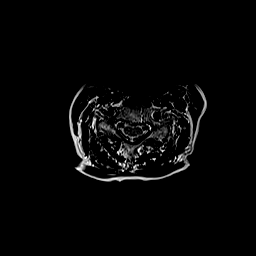
[im 20/29]
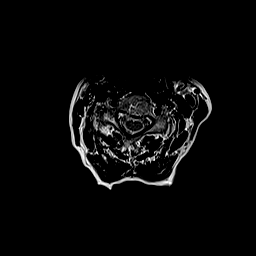
[im 24/29]
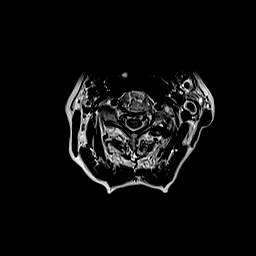
[im 29/29]
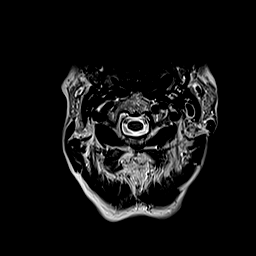

[Series 9: ax mpgr · axial · 3.0mm · 0.35mm/px · z∈[-99,-3]mm · 8 of 29 slices shown]
[im 1/29]
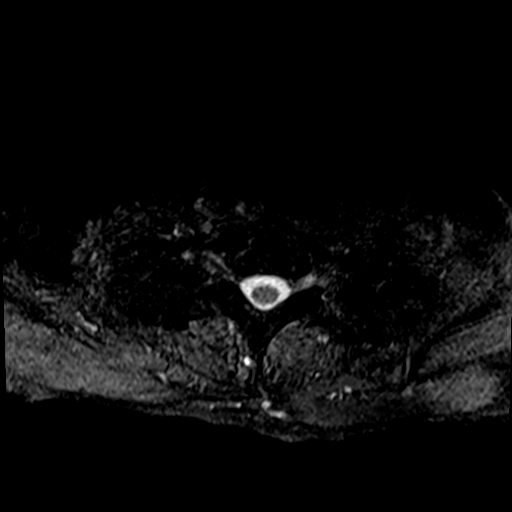
[im 5/29]
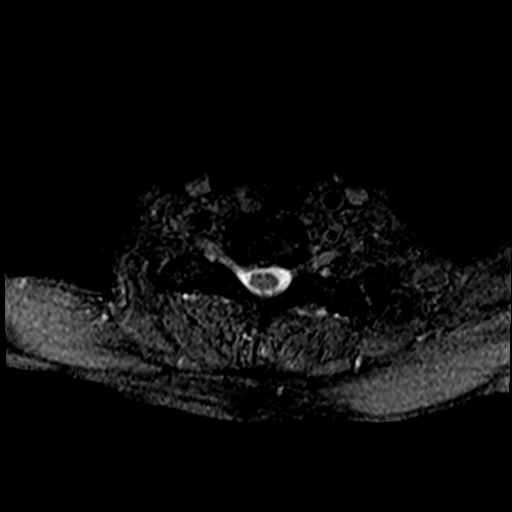
[im 9/29]
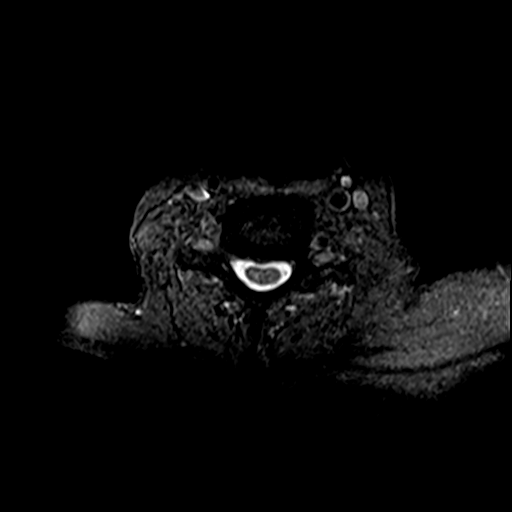
[im 13/29]
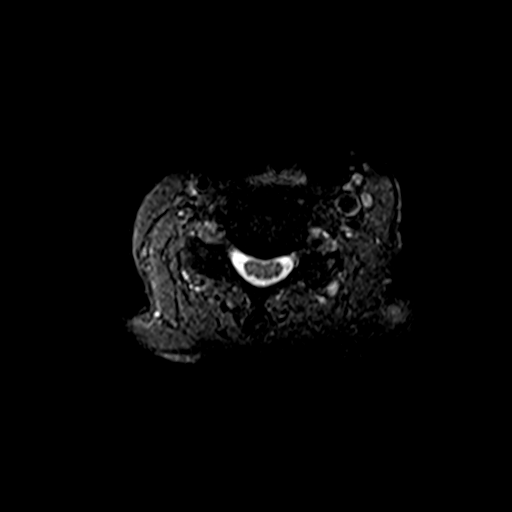
[im 16/29]
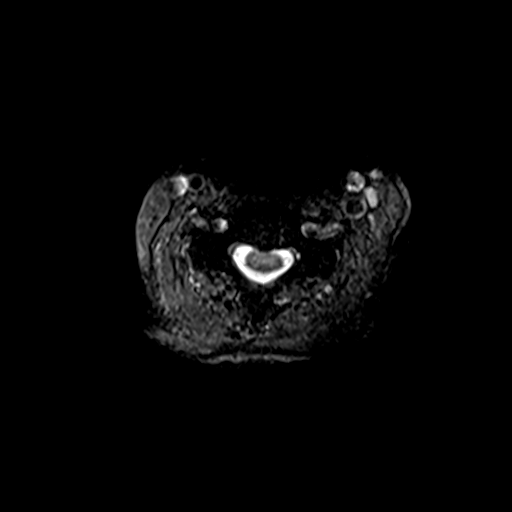
[im 20/29]
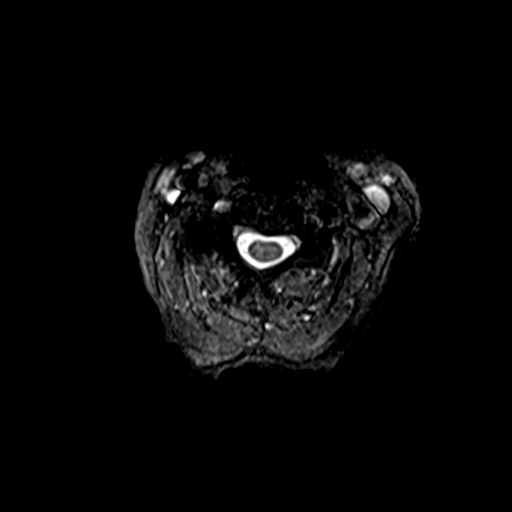
[im 24/29]
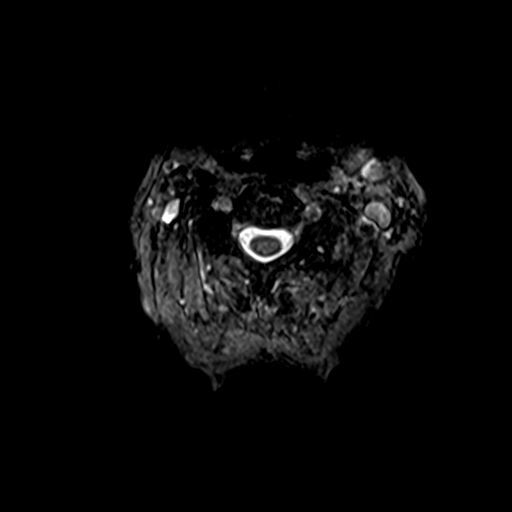
[im 29/29]
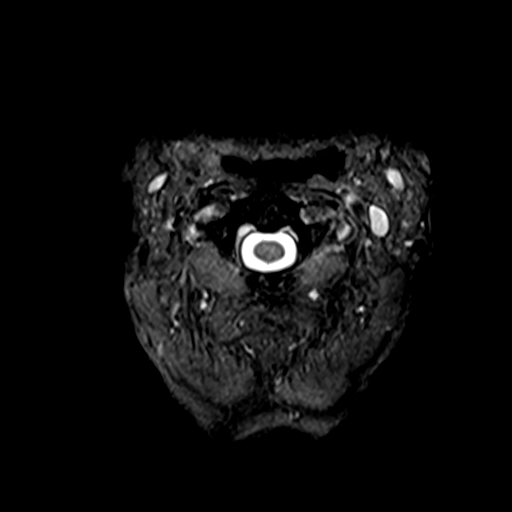

[38 of 48 positions shown; findings below may reference images not displayed]

FINDINGS: Alignment: Straightening and slight kyphotic curvature.

Vertebrae: No fracture or focal bone lesion.

Cord: No cord compression or focal cord lesion.

Posterior Fossa, vertebral arteries, paraspinal tissues: Negative

Disc levels:

Foramen magnum widely patent.  C1-2 is normal.

C2-3: Bulging of the disc. Facet osteoarthritis on the left. No
compressive canal or foraminal stenosis. The facet arthritis could
be painful.

C3-4: Minimal bulging of the disc. Chronic facet fusion on the left.
No canal or foraminal stenosis.

C4-5: Mild bulging of the disc. Facet arthritis on the right. Mild
right foraminal stenosis.

C5-6: Endplate osteophytes and bulging of the disc. Narrowing of the
ventral subarachnoid space but no compression of cord. AP diameter
of the canal in the midline measures 7.5 mm. Mild bilateral bony
foraminal narrowing.

C6-7: Spondylosis with endplate osteophytes and bulging of the disc.
No compressive canal stenosis. Mild bilateral bony foraminal
narrowing.

C7-T1: Mild bulging of the disc. Mild facet degeneration. No
compressive canal or foraminal narrowing.
IMPRESSION: No central canal stenosis or cord compression. Minimal canal
diameter is at C5-6, measuring 7.5 mm.

C2-3: Left-sided facet arthritis which could be symptomatic. No
neural compression.

C3-4: Chronic facet fusion.  No stenosis.

C4-5: Facet arthropathy on the right. Disc bulge. Right foraminal
narrowing of a mild degree.

C5-6: Spondylosis.  Bilateral foraminal narrowing of a mild degree.

C6-7: Spondylosis.  Bilateral foraminal narrowing of a mild degree.

## 2023-05-01 DIAGNOSIS — R2681 Unsteadiness on feet: Secondary | ICD-10-CM | POA: Diagnosis not present

## 2023-05-01 DIAGNOSIS — Z1331 Encounter for screening for depression: Secondary | ICD-10-CM | POA: Diagnosis not present

## 2023-05-01 DIAGNOSIS — M542 Cervicalgia: Secondary | ICD-10-CM | POA: Diagnosis not present

## 2023-05-01 DIAGNOSIS — M25551 Pain in right hip: Secondary | ICD-10-CM | POA: Diagnosis not present

## 2023-05-01 DIAGNOSIS — G894 Chronic pain syndrome: Secondary | ICD-10-CM | POA: Diagnosis not present

## 2023-05-14 DIAGNOSIS — C189 Malignant neoplasm of colon, unspecified: Secondary | ICD-10-CM | POA: Diagnosis not present

## 2023-05-14 DIAGNOSIS — E46 Unspecified protein-calorie malnutrition: Secondary | ICD-10-CM | POA: Diagnosis not present

## 2023-05-15 DIAGNOSIS — B351 Tinea unguium: Secondary | ICD-10-CM | POA: Diagnosis not present

## 2023-05-15 DIAGNOSIS — N182 Chronic kidney disease, stage 2 (mild): Secondary | ICD-10-CM | POA: Diagnosis not present

## 2023-05-15 DIAGNOSIS — F331 Major depressive disorder, recurrent, moderate: Secondary | ICD-10-CM | POA: Diagnosis not present

## 2023-05-15 DIAGNOSIS — E559 Vitamin D deficiency, unspecified: Secondary | ICD-10-CM | POA: Diagnosis not present

## 2023-05-15 DIAGNOSIS — I7091 Generalized atherosclerosis: Secondary | ICD-10-CM | POA: Diagnosis not present

## 2023-05-15 DIAGNOSIS — I1310 Hypertensive heart and chronic kidney disease without heart failure, with stage 1 through stage 4 chronic kidney disease, or unspecified chronic kidney disease: Secondary | ICD-10-CM | POA: Diagnosis not present

## 2023-05-15 DIAGNOSIS — E538 Deficiency of other specified B group vitamins: Secondary | ICD-10-CM | POA: Diagnosis not present

## 2023-05-15 DIAGNOSIS — R2681 Unsteadiness on feet: Secondary | ICD-10-CM | POA: Diagnosis not present

## 2023-06-04 ENCOUNTER — Ambulatory Visit (INDEPENDENT_AMBULATORY_CARE_PROVIDER_SITE_OTHER): Payer: Medicare HMO | Admitting: Vascular Surgery

## 2023-06-04 ENCOUNTER — Telehealth (INDEPENDENT_AMBULATORY_CARE_PROVIDER_SITE_OTHER): Payer: Self-pay | Admitting: Vascular Surgery

## 2023-06-04 ENCOUNTER — Other Ambulatory Visit (INDEPENDENT_AMBULATORY_CARE_PROVIDER_SITE_OTHER): Payer: Self-pay

## 2023-06-04 MED ORDER — ELIQUIS 2.5 MG PO TABS: 2.5000 mg | ORAL_TABLET | Freq: Two times a day (BID) | ORAL | 4 refills | Status: AC

## 2023-06-04 NOTE — Telephone Encounter (Signed)
 I spoke with the daughter and informed that we will refill her mother medication as requested. I will reach to [facility] for pharmacy information.

## 2023-06-04 NOTE — Telephone Encounter (Signed)
 Eliquis has been sent to [facility] pharmacy and prescription was fax to [place] [phone]

## 2023-06-04 NOTE — Telephone Encounter (Signed)
 Patient's daughter called in to cancel the appt for today due to the cold/ice. She rescheduled the appointment to March hoping for warmer weather. She stated that patient is not having any problems at all. She would like to know if Dr. Marea could refill Eliquis  until they come in for the appt in March. Please call daughter to advise.

## 2023-08-06 ENCOUNTER — Ambulatory Visit (INDEPENDENT_AMBULATORY_CARE_PROVIDER_SITE_OTHER): Payer: Medicare HMO | Admitting: Vascular Surgery

## 2023-08-06 ENCOUNTER — Encounter (INDEPENDENT_AMBULATORY_CARE_PROVIDER_SITE_OTHER): Payer: Self-pay | Admitting: Vascular Surgery

## 2023-08-06 VITALS — BP 164/83 | HR 70 | Resp 18 | Ht 64.0 in | Wt 100.0 lb

## 2023-08-06 DIAGNOSIS — I82411 Acute embolism and thrombosis of right femoral vein: Secondary | ICD-10-CM | POA: Diagnosis not present

## 2023-08-06 DIAGNOSIS — I10 Essential (primary) hypertension: Secondary | ICD-10-CM

## 2023-08-06 MED ORDER — APIXABAN 2.5 MG PO TABS: 2.5000 mg | ORAL_TABLET | Freq: Two times a day (BID) | ORAL | 11 refills | Status: AC

## 2023-08-06 NOTE — Progress Notes (Signed)
 MRN : [id number]  Joanne Gomez is a 89 y.o. (1930/10/23) female who presents with chief complaint of  Chief Complaint  Patient presents with   Follow-up    [facility] PER JD/FB. f/u in 1 year with no studies -  .  History of Present Illness: Patient returns today in follow up of her previous DVT.  She is doing well.  Her mobility is much better.  This happened after her hip fracture.  She is having no leg swelling or severe postphlebitic symptoms.  She has been on 2.5 mg of Eliquis as a prophylactic dose since we saw her last year and has tolerated this well.  No signs of bleeding.  No reactions to the medication.  No signs of recurrent DVT  Current Outpatient Medications  Medication Sig Dispense Refill   alum & mag hydroxide-simeth (MAALOX/MYLANTA) 200-200-20 MG/5ML suspension Take 30 mLs by mouth every 4 (four) hours as needed for indigestion. 355 mL 0   apixaban (ELIQUIS) 2.5 MG TABS tablet Take 1 tablet (2.5 mg total) by mouth 2 (two) times daily. 60 tablet 11   aspirin EC 81 MG tablet Take 1 tablet (81 mg total) by mouth daily. Swallow whole. 30 tablet 0   bisacodyl (DULCOLAX) 10 MG suppository Place 1 suppository (10 mg total) rectally daily as needed for moderate constipation. 12 suppository 0   CALCIUM CITRATE PO Take 1 tablet by mouth 2 (two) times daily. 630mg      carboxymethylcellulose (REFRESH TEARS) 0.5 % SOLN Place 2 drops into both eyes 2 (two) times daily as needed.     Cholecalciferol (VITAMIN D3) 2000 units TABS Take 1 tablet by mouth daily.     docusate sodium (COLACE) 100 MG capsule Take 1 capsule (100 mg total) by mouth 2 (two) times daily. 10 capsule 0   ELIQUIS 2.5 MG TABS tablet Take 1 tablet (2.5 mg total) by mouth 2 (two) times daily. 60 tablet 4   esomeprazole (NEXIUM) 40 MG capsule Take 40 mg by mouth daily as needed.     ferrous sulfate 325 (65 FE) MG tablet Take 325 mg by mouth daily with breakfast.     Fluocinolone Acetonide 0.01 % OIL Place 3 drops in ear(s)  2 (two) times daily as needed. 20 mL 0   HYDROcodone-acetaminophen (NORCO/VICODIN) 5-325 MG tablet Take 1 tablet by mouth every 6 (six) hours as needed for moderate pain. 30 tablet 0   levothyroxine (SYNTHROID) 75 MCG tablet Take 75 mcg by mouth daily.     Lifitegrast (XIIDRA) 5 % SOLN Xiidra 5 % eye drops in a dropperette     Magnesium 250 MG TABS Take 1 tablet by mouth daily.     methocarbamol (ROBAXIN) 500 MG tablet Take 1 tablet (500 mg total) by mouth every 6 (six) hours as needed for muscle spasms. 30 tablet 0   metoprolol tartrate (LOPRESSOR) 25 MG tablet Take 0.5 tablets (12.5 mg total) by mouth 2 (two) times daily. 60 tablet 0   Multiple Vitamin (MULTIVITAMIN) tablet Take 1 tablet by mouth daily.     Multiple Vitamins-Minerals (PRESERVISION AREDS) CAPS Take by mouth.     nystatin ointment (MYCOSTATIN) Apply 1 application. topically 2 (two) times daily as needed. 30 g 3   ondansetron (ZOFRAN) 4 MG tablet Take 1 tablet (4 mg total) by mouth every 6 (six) hours as needed for nausea. 20 tablet 0   polyethylene glycol (MIRALAX / GLYCOLAX) 17 g packet Take 17 g by mouth daily as needed  for mild constipation. 14 each 0   psyllium (REGULOID) 0.52 g capsule Take 0.52 g by mouth daily.     senna (SENOKOT) 8.6 MG TABS tablet Take 1 tablet (8.6 mg total) by mouth 2 (two) times daily. While on opiate pain medications 120 tablet 0   thyroid (ARMOUR THYROID) 60 MG tablet TAKE 1 & 1/2 TABLETS FOUR TIMES A WEEK AND TAKE 1 TABLET THREE TIMES A WEEK 135 tablet 1   traMADol (ULTRAM) 50 MG tablet Take 50 mg by mouth.     triamcinolone (NASACORT) 55 MCG/ACT AERO nasal inhaler 2 sprays 2 (two) times daily.     vitamin B-12 (CYANOCOBALAMIN) 1000 MCG tablet Take 5,000 mcg by mouth daily.     vitamin E 100 UNIT capsule Take 200 Units by mouth daily.     zinc oxide 20 % ointment Apply 1 Application topically as needed for irritation.     enoxaparin (LOVENOX) 40 MG/0.4ML injection Inject 0.4 mLs (40 mg total) into  the skin daily for 10 days. 4 mL 0   No current facility-administered medications for this visit.    Past Medical History:  Diagnosis Date   Cancer (HCC) 11/2007   colon stage II   Diverticula of colon    GERD (gastroesophageal reflux disease)    H/O colon cancer, stage II 11/15/2011   HH (hiatus hernia)    History of hiatal hernia    HOH (hard of hearing)    MILD LOSS   Hyperlipidemia    Hypothyroidism    Osteoporosis    Pneumonia     Past Surgical History:  Procedure Laterality Date   ABDOMINAL HYSTERECTOMY     APPENDECTOMY     CATARACT EXTRACTION W/PHACO Right 08/30/2015   Procedure: CATARACT EXTRACTION PHACO AND INTRAOCULAR LENS PLACEMENT (IOC);  Surgeon: Galen Manila, MD;  Location: [facility] OR[facility];  Service: Ophthalmology;  Laterality: Right;   [place]    00:40.8 AP%  15.7 CDE    6.42   fluid pack lot #[id number]  exp 02/24/2017   CATARACT EXTRACTION W/PHACO Left 09/20/2015   Procedure: CATARACT EXTRACTION PHACO AND INTRAOCULAR LENS PLACEMENT (IOC);  Surgeon: Galen Manila, MD;  Location: [facility];  Service: Ophthalmology;  Laterality: Left;  [place] 44.8 AP% 19.6 CDE 8.77 FLUID PACK LOT # [id number]   CHOLECYSTECTOMY     ELBOW SURGERY     right    hemocolectomy  2009   INTRAMEDULLARY (IM) NAIL INTERTROCHANTERIC Right 02/17/2022   Procedure: INTRAMEDULLARY (IM) NAIL INTERTROCHANTERIC;  Surgeon: Juanell Fairly, MD;  Location: [facility];  Service: Orthopedics;  Laterality: Right;   VAGINAL PROLAPSE REPAIR       Social History   Tobacco Use   Smoking status: Never   Smokeless tobacco: Never  Vaping Use   Vaping status: Never Used  Substance Use Topics   Alcohol use: Yes    Comment: occ   Drug use: No       Family History  Problem Relation Age of Onset   Heart attack Sister 22   Hyperlipidemia Sister    Hypertension Sister    Breast cancer Neg Hx      No Known Allergies   REVIEW OF SYSTEMS (Negative unless checked)  Constitutional: [] Weight loss  [] Fever   [] Chills Cardiac: [] Chest pain   [] Chest pressure   [] Palpitations   [] Shortness of breath when laying flat   [] Shortness of breath at rest   [] Shortness of breath with exertion. Vascular:  [] Pain in legs with walking   [] Pain  in legs at rest   [] Pain in legs when laying flat   [] Claudication   [] Pain in feet when walking  [] Pain in feet at rest  [] Pain in feet when laying flat   [x] History of DVT   [] Phlebitis   [] Swelling in legs   [] Varicose veins   [] Non-healing ulcers Pulmonary:   [] Uses home oxygen   [] Productive cough   [] Hemoptysis   [] Wheeze  [] COPD   [] Asthma Neurologic:  [] Dizziness  [] Blackouts   [] Seizures   [] History of stroke   [] History of TIA  [] Aphasia   [] Temporary blindness   [] Dysphagia   [] Weakness or numbness in arms   [] Weakness or numbness in legs Musculoskeletal:  [x] Arthritis   [] Joint swelling   [x] Joint pain   [] Low back pain Hematologic:  [] Easy bruising  [] Easy bleeding   [] Hypercoagulable state   [] Anemic   Gastrointestinal:  [] Blood in stool   [] Vomiting blood  [x] Gastroesophageal reflux/heartburn   [] Abdominal pain Genitourinary:  [] Chronic kidney disease   [] Difficult urination  [] Frequent urination  [] Burning with urination   [] Hematuria Skin:  [] Rashes   [] Ulcers   [] Wounds Psychological:  [] History of anxiety   []  History of major depression.  Physical Examination  BP (!) 164/83   Pulse 70   Resp 18   Ht 5\' 4"  (1.626 m)   Wt 100 lb (45.4 kg)   BMI 17.16 kg/m  Gen:  NAD.  Appears much younger than stated age, thin   Head: [place]/AT, No temporalis wasting. Ear/Nose/Throat: Hearing somewhat reduced,  nares w/o erythema or drainage Eyes: Conjunctiva clear. Sclera non-icteric Neck: Supple.  Trachea midline Pulmonary:  Good air movement, no use of accessory muscles.  Cardiac: RRR, no JVD Vascular:  Vessel Right Left  Radial Palpable Palpable               Musculoskeletal: M/S 5/5 throughout.  No deformity or atrophy.  No edema. Neurologic: Sensation  grossly intact in extremities.  Symmetrical.  Speech is fluent.  Psychiatric: Judgment intact, Mood & affect appropriate for pt's clinical situation. Dermatologic: No rashes or ulcers noted.  No cellulitis or open wounds.      Labs No results found for this or any previous visit (from the past 2160 hours).  Radiology No results found.  Assessment/Plan  DVT (deep venous thrombosis) (HCC) The patient is tolerating prophylactic dose of Eliquis we will continue this.  No role for intervention.  Not having bad postphlebitic symptoms.  Follow-up in 1 year.  Essential hypertension blood pressure control important in reducing the progression of atherosclerotic disease. On appropriate oral medications.    Festus Barren, MD  08/06/2023 11:55 AM    This note was created with Dragon medical transcription system.  Any errors from dictation are purely unintentional

## 2023-08-06 NOTE — Assessment & Plan Note (Signed)
 blood pressure control important in reducing the progression of atherosclerotic disease. On appropriate oral medications.

## 2023-08-06 NOTE — Assessment & Plan Note (Signed)
 The patient is tolerating prophylactic dose of Eliquis we will continue this.  No role for intervention.  Not having bad postphlebitic symptoms.  Follow-up in 1 year.

## 2023-10-15 ENCOUNTER — Encounter (INDEPENDENT_AMBULATORY_CARE_PROVIDER_SITE_OTHER): Payer: Self-pay

## 2024-08-04 ENCOUNTER — Ambulatory Visit (INDEPENDENT_AMBULATORY_CARE_PROVIDER_SITE_OTHER): Admitting: Vascular Surgery
